# Patient Record
Sex: Female | Born: 1960
Health system: Southern US, Community
[De-identification: ages and names within clinical notes are randomized; demographics above are authoritative.]

## PROBLEM LIST (undated history)

## (undated) DIAGNOSIS — L309 Dermatitis, unspecified: Secondary | ICD-10-CM

## (undated) DIAGNOSIS — I10 Essential (primary) hypertension: Secondary | ICD-10-CM

## (undated) DIAGNOSIS — E559 Vitamin D deficiency, unspecified: Secondary | ICD-10-CM

## (undated) DIAGNOSIS — R7303 Prediabetes: Secondary | ICD-10-CM

## (undated) DIAGNOSIS — T7840XA Allergy, unspecified, initial encounter: Secondary | ICD-10-CM

## (undated) DIAGNOSIS — G43909 Migraine, unspecified, not intractable, without status migrainosus: Secondary | ICD-10-CM

## (undated) DIAGNOSIS — E669 Obesity, unspecified: Secondary | ICD-10-CM

## (undated) DIAGNOSIS — E785 Hyperlipidemia, unspecified: Secondary | ICD-10-CM

## (undated) DIAGNOSIS — J45909 Unspecified asthma, uncomplicated: Secondary | ICD-10-CM

## (undated) HISTORY — DX: Dermatitis, unspecified: L30.9

## (undated) HISTORY — DX: Allergy, unspecified, initial encounter: T78.40XA

## (undated) HISTORY — DX: Prediabetes: R73.03

## (undated) HISTORY — DX: Essential (primary) hypertension: I10

## (undated) HISTORY — DX: Vitamin D deficiency, unspecified: E55.9

## (undated) HISTORY — DX: Unspecified asthma, uncomplicated: J45.909

## (undated) HISTORY — DX: Obesity, unspecified: E66.9

## (undated) HISTORY — DX: Hyperlipidemia, unspecified: E78.5

## (undated) HISTORY — DX: Migraine, unspecified, not intractable, without status migrainosus: G43.909

## (undated) HISTORY — PX: ENDOMETRIAL ABLATION: SHX621

---

## 1989-08-07 HISTORY — PX: LYMPH NODE BIOPSY: SHX201

## 1999-08-24 ENCOUNTER — Encounter: Admission: RE | Admit: 1999-08-24 | Discharge: 1999-08-24 | Payer: Self-pay | Admitting: Gynecology

## 1999-08-24 ENCOUNTER — Encounter: Payer: Self-pay | Admitting: Gynecology

## 1999-09-14 ENCOUNTER — Encounter: Admission: RE | Admit: 1999-09-14 | Discharge: 1999-09-14 | Payer: Self-pay | Admitting: Gynecology

## 1999-09-14 ENCOUNTER — Encounter: Payer: Self-pay | Admitting: Gynecology

## 2000-03-20 ENCOUNTER — Encounter: Payer: Self-pay | Admitting: Internal Medicine

## 2000-03-20 ENCOUNTER — Encounter: Admission: RE | Admit: 2000-03-20 | Discharge: 2000-03-20 | Payer: Self-pay | Admitting: Internal Medicine

## 2000-07-04 ENCOUNTER — Other Ambulatory Visit: Admission: RE | Admit: 2000-07-04 | Discharge: 2000-07-04 | Payer: Self-pay | Admitting: Gynecology

## 2000-09-26 ENCOUNTER — Encounter: Payer: Self-pay | Admitting: Gynecology

## 2000-09-26 ENCOUNTER — Encounter: Admission: RE | Admit: 2000-09-26 | Discharge: 2000-09-26 | Payer: Self-pay | Admitting: Gynecology

## 2001-08-12 ENCOUNTER — Other Ambulatory Visit: Admission: RE | Admit: 2001-08-12 | Discharge: 2001-08-12 | Payer: Self-pay | Admitting: Gynecology

## 2001-09-27 ENCOUNTER — Encounter: Admission: RE | Admit: 2001-09-27 | Discharge: 2001-09-27 | Payer: Self-pay | Admitting: Gynecology

## 2001-09-27 ENCOUNTER — Encounter: Payer: Self-pay | Admitting: Gynecology

## 2001-10-07 ENCOUNTER — Encounter: Admission: RE | Admit: 2001-10-07 | Discharge: 2001-10-07 | Payer: Self-pay | Admitting: Gynecology

## 2001-10-07 ENCOUNTER — Encounter: Payer: Self-pay | Admitting: Gynecology

## 2002-08-26 ENCOUNTER — Other Ambulatory Visit: Admission: RE | Admit: 2002-08-26 | Discharge: 2002-08-26 | Payer: Self-pay | Admitting: Gynecology

## 2002-10-01 ENCOUNTER — Encounter: Payer: Self-pay | Admitting: Gynecology

## 2002-10-01 ENCOUNTER — Encounter: Admission: RE | Admit: 2002-10-01 | Discharge: 2002-10-01 | Payer: Self-pay | Admitting: Gynecology

## 2002-12-30 ENCOUNTER — Encounter: Payer: Self-pay | Admitting: Internal Medicine

## 2002-12-30 ENCOUNTER — Encounter: Admission: RE | Admit: 2002-12-30 | Discharge: 2002-12-30 | Payer: Self-pay | Admitting: Internal Medicine

## 2003-08-31 ENCOUNTER — Other Ambulatory Visit: Admission: RE | Admit: 2003-08-31 | Discharge: 2003-08-31 | Payer: Self-pay | Admitting: Gynecology

## 2003-10-05 ENCOUNTER — Encounter: Admission: RE | Admit: 2003-10-05 | Discharge: 2003-10-05 | Payer: Self-pay | Admitting: Gynecology

## 2004-09-01 ENCOUNTER — Other Ambulatory Visit: Admission: RE | Admit: 2004-09-01 | Discharge: 2004-09-01 | Payer: Self-pay | Admitting: Gynecology

## 2004-11-08 ENCOUNTER — Encounter: Admission: RE | Admit: 2004-11-08 | Discharge: 2004-11-08 | Payer: Self-pay | Admitting: Gynecology

## 2005-11-09 ENCOUNTER — Encounter: Admission: RE | Admit: 2005-11-09 | Discharge: 2005-11-09 | Payer: Self-pay | Admitting: Obstetrics and Gynecology

## 2006-12-06 ENCOUNTER — Encounter: Admission: RE | Admit: 2006-12-06 | Discharge: 2006-12-06 | Payer: Self-pay | Admitting: Obstetrics and Gynecology

## 2006-12-12 ENCOUNTER — Encounter: Admission: RE | Admit: 2006-12-12 | Discharge: 2006-12-12 | Payer: Self-pay | Admitting: Obstetrics and Gynecology

## 2007-08-08 HISTORY — PX: DILATION AND CURETTAGE, DIAGNOSTIC / THERAPEUTIC: SUR384

## 2007-12-09 ENCOUNTER — Encounter: Admission: RE | Admit: 2007-12-09 | Discharge: 2007-12-09 | Payer: Self-pay | Admitting: Obstetrics and Gynecology

## 2008-12-21 ENCOUNTER — Encounter: Admission: RE | Admit: 2008-12-21 | Discharge: 2008-12-21 | Payer: Self-pay | Admitting: Obstetrics and Gynecology

## 2010-01-10 ENCOUNTER — Encounter: Admission: RE | Admit: 2010-01-10 | Discharge: 2010-01-10 | Payer: Self-pay | Admitting: Internal Medicine

## 2010-08-07 HISTORY — PX: NEVUS EXCISION: SHX2090

## 2010-12-21 ENCOUNTER — Other Ambulatory Visit: Payer: Self-pay | Admitting: Internal Medicine

## 2010-12-21 DIAGNOSIS — Z1231 Encounter for screening mammogram for malignant neoplasm of breast: Secondary | ICD-10-CM

## 2011-01-30 ENCOUNTER — Ambulatory Visit
Admission: RE | Admit: 2011-01-30 | Discharge: 2011-01-30 | Disposition: A | Discharge status: Home / Self Care | Payer: 59 | Source: Ambulatory Visit | Attending: Internal Medicine | Admitting: Internal Medicine

## 2011-01-30 DIAGNOSIS — Z1231 Encounter for screening mammogram for malignant neoplasm of breast: Secondary | ICD-10-CM

## 2012-01-04 ENCOUNTER — Other Ambulatory Visit: Payer: Self-pay | Admitting: Obstetrics and Gynecology

## 2012-01-04 DIAGNOSIS — Z1231 Encounter for screening mammogram for malignant neoplasm of breast: Secondary | ICD-10-CM

## 2012-02-12 ENCOUNTER — Ambulatory Visit
Admission: RE | Admit: 2012-02-12 | Discharge: 2012-02-12 | Disposition: A | Discharge status: Home / Self Care | Payer: BC Managed Care – PPO | Source: Ambulatory Visit | Attending: Obstetrics and Gynecology | Admitting: Obstetrics and Gynecology

## 2012-02-12 DIAGNOSIS — Z1231 Encounter for screening mammogram for malignant neoplasm of breast: Secondary | ICD-10-CM

## 2013-01-20 ENCOUNTER — Other Ambulatory Visit: Payer: Self-pay

## 2013-01-20 DIAGNOSIS — Z1231 Encounter for screening mammogram for malignant neoplasm of breast: Secondary | ICD-10-CM

## 2013-01-21 ENCOUNTER — Other Ambulatory Visit: Payer: Self-pay | Admitting: Obstetrics and Gynecology

## 2013-01-21 DIAGNOSIS — N958 Other specified menopausal and perimenopausal disorders: Secondary | ICD-10-CM

## 2013-02-24 ENCOUNTER — Ambulatory Visit
Admission: RE | Admit: 2013-02-24 | Discharge: 2013-02-24 | Disposition: A | Discharge status: Home / Self Care | Payer: BC Managed Care – PPO | Source: Ambulatory Visit

## 2013-02-24 ENCOUNTER — Ambulatory Visit
Admission: RE | Admit: 2013-02-24 | Discharge: 2013-02-24 | Disposition: A | Discharge status: Home / Self Care | Payer: BC Managed Care – PPO | Source: Ambulatory Visit | Attending: Obstetrics and Gynecology | Admitting: Obstetrics and Gynecology

## 2013-02-24 ENCOUNTER — Ambulatory Visit: Payer: BC Managed Care – PPO

## 2013-02-24 DIAGNOSIS — Z1231 Encounter for screening mammogram for malignant neoplasm of breast: Secondary | ICD-10-CM

## 2013-02-24 DIAGNOSIS — N958 Other specified menopausal and perimenopausal disorders: Secondary | ICD-10-CM

## 2013-03-04 LAB — HM MAMMOGRAPHY: HM Mammogram: NEGATIVE

## 2013-07-29 ENCOUNTER — Encounter: Payer: Self-pay | Admitting: Physician Assistant

## 2013-07-29 DIAGNOSIS — J45909 Unspecified asthma, uncomplicated: Secondary | ICD-10-CM | POA: Insufficient documentation

## 2013-07-29 DIAGNOSIS — R7309 Other abnormal glucose: Secondary | ICD-10-CM | POA: Insufficient documentation

## 2013-07-29 DIAGNOSIS — I10 Essential (primary) hypertension: Secondary | ICD-10-CM

## 2013-07-29 DIAGNOSIS — R7303 Prediabetes: Secondary | ICD-10-CM

## 2013-07-29 DIAGNOSIS — E669 Obesity, unspecified: Secondary | ICD-10-CM

## 2013-07-29 DIAGNOSIS — E559 Vitamin D deficiency, unspecified: Secondary | ICD-10-CM

## 2013-07-29 DIAGNOSIS — E785 Hyperlipidemia, unspecified: Secondary | ICD-10-CM

## 2013-07-30 ENCOUNTER — Other Ambulatory Visit: Payer: Self-pay | Admitting: Internal Medicine

## 2013-08-04 ENCOUNTER — Encounter: Payer: Self-pay | Admitting: Physician Assistant

## 2013-08-04 ENCOUNTER — Ambulatory Visit (INDEPENDENT_AMBULATORY_CARE_PROVIDER_SITE_OTHER): Payer: BC Managed Care – PPO | Admitting: Physician Assistant

## 2013-08-04 VITALS — BP 130/80 | HR 80 | Temp 97.9°F | Resp 16 | Ht 63.5 in | Wt 194.0 lb

## 2013-08-04 DIAGNOSIS — R7303 Prediabetes: Secondary | ICD-10-CM

## 2013-08-04 DIAGNOSIS — E669 Obesity, unspecified: Secondary | ICD-10-CM

## 2013-08-04 DIAGNOSIS — Z79899 Other long term (current) drug therapy: Secondary | ICD-10-CM

## 2013-08-04 DIAGNOSIS — R7309 Other abnormal glucose: Secondary | ICD-10-CM

## 2013-08-04 DIAGNOSIS — E785 Hyperlipidemia, unspecified: Secondary | ICD-10-CM

## 2013-08-04 DIAGNOSIS — E559 Vitamin D deficiency, unspecified: Secondary | ICD-10-CM

## 2013-08-04 DIAGNOSIS — I10 Essential (primary) hypertension: Secondary | ICD-10-CM

## 2013-08-04 LAB — BASIC METABOLIC PANEL WITH GFR
BUN: 20 mg/dL (ref 6–23)
CO2: 28 mEq/L (ref 19–32)
Creat: 0.69 mg/dL (ref 0.50–1.10)
Sodium: 142 mEq/L (ref 135–145)

## 2013-08-04 LAB — MAGNESIUM: Magnesium: 2.2 mg/dL (ref 1.5–2.5)

## 2013-08-04 LAB — HEPATIC FUNCTION PANEL
ALT: 27 U/L (ref 0–35)
AST: 22 U/L (ref 0–37)
Alkaline Phosphatase: 81 U/L (ref 39–117)
Bilirubin, Direct: 0.1 mg/dL (ref 0.0–0.3)
Indirect Bilirubin: 0.3 mg/dL (ref 0.0–0.9)
Total Protein: 6.7 g/dL (ref 6.0–8.3)

## 2013-08-04 LAB — CBC WITH DIFFERENTIAL/PLATELET
Eosinophils Absolute: 0.3 10*3/uL (ref 0.0–0.7)
Hemoglobin: 13.3 g/dL (ref 12.0–15.0)
Platelets: 258 10*3/uL (ref 150–400)
RBC: 4.33 MIL/uL (ref 3.87–5.11)
WBC: 8.4 10*3/uL (ref 4.0–10.5)

## 2013-08-04 LAB — LIPID PANEL
LDL Cholesterol: 129 mg/dL — ABNORMAL HIGH (ref 0–99)
Total CHOL/HDL Ratio: 5.8 Ratio

## 2013-08-04 MED ORDER — FUROSEMIDE 40 MG PO TABS: 40.0000 mg | ORAL_TABLET | Freq: Every day | ORAL | Status: DC

## 2013-08-04 NOTE — Progress Notes (Signed)
HPI Patient presents for 3 month follow up with hypertension, hyperlipidemia, prediabetes and vitamin D. Patient's blood pressure has been controlled at home, today their BP is BP: 130/80 mmHg  Patient denies chest pain, shortness of breath, dizziness.  Patient's cholesterol is diet controlled. In addition they are on pravastatin 40 and denies myalgias. The cholesterol last visit was LDL 94. The patient has been working on diet and exercise for prediabetes, and denies changes in vision, polys, and paresthesias. A1C was 5.6.  Her weight is down 6 lbs from her last visit. She is eating less sugar, less fast food, and cutting back on portions. She did a 10 K in Novemeber, no SOB, CP.  Patient is on Vitamin D supplement, last number was 81.  Current Medications:  Current Outpatient Prescriptions on File Prior to Visit  Medication Sig Dispense Refill  . albuterol (PROVENTIL HFA;VENTOLIN HFA) 108 (90 BASE) MCG/ACT inhaler Inhale into the lungs every 6 (six) hours as needed for wheezing or shortness of breath.      . budesonide-formoterol (SYMBICORT) 160-4.5 MCG/ACT inhaler Inhale 2 puffs into the lungs 2 (two) times daily.      . calcium carbonate (OS-CAL) 600 MG TABS tablet Take 600 mg by mouth 2 (two) times daily with a meal.      . cholecalciferol (VITAMIN D) 1000 UNITS tablet Take 5,000 Units by mouth daily.      . cyanocobalamin 500 MCG tablet Take 500 mcg by mouth daily.      . furosemide (LASIX) 40 MG tablet Take 40 mg by mouth.      . losartan (COZAAR) 100 MG tablet Take 100 mg by mouth daily.      . mometasone (NASONEX) 50 MCG/ACT nasal spray Place 2 sprays into the nose daily.      . potassium chloride SA (K-DUR,KLOR-CON) 20 MEQ tablet Take 20 mEq by mouth 2 (two) times daily.      . pravastatin (PRAVACHOL) 40 MG tablet take 1 tablet by mouth at bedtime for cholesterol  90 tablet  0   No current facility-administered medications on file prior to visit.   Medical History:  Past Medical  History  Diagnosis Date  . Hypertension   . Hyperlipidemia   . Allergy   . Asthma   . Vitamin D deficiency   . Prediabetes   . Obesity   . Migraine    Allergies:  Allergies  Allergen Reactions  . Ace Inhibitors     cough  . Azithromycin     rash  . Prednisone     High dose  . Vicodin [Hydrocodone-Acetaminophen]     Nausea/vomiting    ROS Constitutional: Denies fever, chills, headaches, insomnia, fatigue, night sweats Eyes: Denies redness, blurred vision, diplopia, discharge, itchy, watery eyes.  ENT: Denies congestion, post nasal drip, sore throat, earache, dental pain, Tinnitus, Vertigo, Sinus pain, snoring.  Cardio: Denies chest pain, palpitations, irregular heartbeat, dyspnea, diaphoresis, orthopnea, PND, claudication, edema Respiratory: denies cough, shortness of breath, wheezing.  Gastrointestinal: Denies dysphagia, heartburn, AB pain/ cramps, N/V, diarrhea, constipation, hematemesis, melena, hematochezia,  hemorrhoids Genitourinary: Denies dysuria, frequency, urgency, nocturia, hesitancy, discharge, hematuria, flank pain Musculoskeletal: Denies myalgia, stiffness, pain, swelling and strain/sprain. Skin: Denies pruritis, rash, changing in skin lesion Neuro: Denies Weakness, tremor, incoordination, spasms, pain Psychiatric: Denies confusion, memory loss, sensory loss Endocrine: Denies change in weight, skin, hair change, nocturia Diabetic Polys, Denies visual blurring, hyper /hypo glycemic episodes, and paresthesia, Heme/Lymph: Denies Excessive bleeding, bruising, enlarged lymph nodes  Family  history- Review and unchanged Social history- Review and unchanged Physical Exam: Filed Vitals:   08/04/13 1604  BP: 130/80  Pulse: 80  Temp: 97.9 F (36.6 C)  Resp: 16   Filed Weights   08/04/13 1604  Weight: 194 lb (87.998 kg)   General Appearance: Well nourished, in no apparent distress. Eyes: PERRLA, EOMs, conjunctiva no swelling or erythema Sinuses: No  Frontal/maxillary tenderness ENT/Mouth: Ext aud canals clear, TMs without erythema, bulging. No erythema, swelling, or exudate on post pharynx.  Tonsils not swollen or erythematous. Hearing normal.  Neck: Supple, thyroid normal.  Respiratory: Respiratory effort normal, BS equal bilaterally without rales, rhonchi, wheezing or stridor.  Cardio: RRR with no MRGs. Brisk peripheral pulses without edema.  Abdomen: Soft, + BS.  Non tender, no guarding, rebound, hernias, masses. Lymphatics: Non tender without lymphadenopathy.  Musculoskeletal: Full ROM, 5/5 strength, normal gait.  Skin: Warm, dry without rashes, lesions, ecchymosis.  Neuro: Cranial nerves intact. Normal muscle tone, no cerebellar symptoms. Sensation intact.  Psych: Awake and oriented X 3, normal affect, Insight and Judgment appropriate.   Assessment and Plan:  Hypertension: Continue medication, monitor blood pressure at home.  Continue DASH diet. Cholesterol: Continue diet and exercise. Check cholesterol.  Pre-diabetes-Continue diet and exercise. Check A1C Vitamin D Def- check level and continue medications.   Continue diet and meds as discussed. Further disposition pending results of labs.  Rhonda [name] 4:08 PM

## 2013-08-04 NOTE — Patient Instructions (Signed)
   Bad carbs also include fruit juice, alcohol, and sweet tea. These are empty calories that do not signal to your brain that you are full.   Please remember the good carbs are still carbs which convert into sugar. So please measure them out no more than 1/2-1 cup of rice, oatmeal, pasta, and beans.  Veggies are however free foods! Pile them on.   I like lean protein at every meal such as chicken, turkey, pork chops, cottage cheese, etc. Just do not fry these meats and please center your meal around vegetable, the meats should be a side dish.   No all fruit is created equal. Please see the list below, the fruit at the bottom is higher in sugars than the fruit at the top   Remember exercise is great for your cardiovascular health and can help with weight loss but YOU CAN NOT OUT RUN YOUR FORK!     

## 2013-08-05 ENCOUNTER — Other Ambulatory Visit: Payer: Self-pay

## 2013-08-05 LAB — VITAMIN D 25 HYDROXY (VIT D DEFICIENCY, FRACTURES): Vit D, 25-Hydroxy: 74 ng/mL (ref 30–89)

## 2013-08-05 LAB — HEMOGLOBIN A1C: Mean Plasma Glucose: 111 mg/dL (ref <117)

## 2013-08-05 MED ORDER — FUROSEMIDE 40 MG PO TABS: 40.0000 mg | ORAL_TABLET | Freq: Two times a day (BID) | ORAL | Status: DC

## 2013-11-11 ENCOUNTER — Encounter: Payer: Self-pay | Admitting: Internal Medicine

## 2013-11-11 ENCOUNTER — Ambulatory Visit (INDEPENDENT_AMBULATORY_CARE_PROVIDER_SITE_OTHER): Payer: BC Managed Care – PPO | Admitting: Internal Medicine

## 2013-11-11 VITALS — BP 146/80 | HR 72 | Temp 97.9°F | Resp 16 | Ht 63.5 in | Wt 197.6 lb

## 2013-11-11 DIAGNOSIS — Z79899 Other long term (current) drug therapy: Secondary | ICD-10-CM | POA: Insufficient documentation

## 2013-11-11 DIAGNOSIS — E559 Vitamin D deficiency, unspecified: Secondary | ICD-10-CM

## 2013-11-11 DIAGNOSIS — E785 Hyperlipidemia, unspecified: Secondary | ICD-10-CM

## 2013-11-11 DIAGNOSIS — J209 Acute bronchitis, unspecified: Secondary | ICD-10-CM

## 2013-11-11 DIAGNOSIS — I10 Essential (primary) hypertension: Secondary | ICD-10-CM

## 2013-11-11 DIAGNOSIS — R7309 Other abnormal glucose: Secondary | ICD-10-CM

## 2013-11-11 DIAGNOSIS — R7303 Prediabetes: Secondary | ICD-10-CM

## 2013-11-11 LAB — CBC WITH DIFFERENTIAL/PLATELET
Basophils Absolute: 0.1 10*3/uL (ref 0.0–0.1)
Basophils Relative: 1 % (ref 0–1)
EOS ABS: 0.1 10*3/uL (ref 0.0–0.7)
EOS PCT: 2 % (ref 0–5)
HCT: 35.7 % — ABNORMAL LOW (ref 36.0–46.0)
Hemoglobin: 12.6 g/dL (ref 12.0–15.0)
LYMPHS ABS: 1.2 10*3/uL (ref 0.7–4.0)
Lymphocytes Relative: 21 % (ref 12–46)
MCH: 29.9 pg (ref 26.0–34.0)
MCHC: 35.3 g/dL (ref 30.0–36.0)
MCV: 84.8 fL (ref 78.0–100.0)
Monocytes Absolute: 0.4 10*3/uL (ref 0.1–1.0)
Monocytes Relative: 7 % (ref 3–12)
Neutro Abs: 3.9 10*3/uL (ref 1.7–7.7)
Neutrophils Relative %: 69 % (ref 43–77)
PLATELETS: 236 10*3/uL (ref 150–400)
RBC: 4.21 MIL/uL (ref 3.87–5.11)
RDW: 13.4 % (ref 11.5–15.5)
WBC: 5.6 10*3/uL (ref 4.0–10.5)

## 2013-11-11 LAB — LIPID PANEL
Cholesterol: 146 mg/dL (ref 0–200)
HDL: 44 mg/dL (ref >39)
LDL Cholesterol: 69 mg/dL (ref 0–99)
Total CHOL/HDL Ratio: 3.3 Ratio
Triglycerides: 165 mg/dL — ABNORMAL HIGH (ref <150)
VLDL: 33 mg/dL (ref 0–40)

## 2013-11-11 LAB — HEMOGLOBIN A1C
HEMOGLOBIN A1C: 5.5 % (ref <5.7)
Mean Plasma Glucose: 111 mg/dL (ref <117)

## 2013-11-11 MED ORDER — PROMETHAZINE-DM 6.25-15 MG/5ML PO SYRP: ORAL_SOLUTION | ORAL | Status: DC

## 2013-11-11 MED ORDER — LEVOFLOXACIN 500 MG PO TABS: 500.0000 mg | ORAL_TABLET | Freq: Every day | ORAL | Status: AC

## 2013-11-11 MED ORDER — BENZONATATE 100 MG PO CAPS: ORAL_CAPSULE | ORAL | Status: DC

## 2013-11-11 MED ORDER — PREDNISONE 10 MG PO TABS: 10.0000 mg | ORAL_TABLET | ORAL | Status: DC

## 2013-11-11 NOTE — Patient Instructions (Signed)

## 2013-11-11 NOTE — Progress Notes (Signed)
Patient ID: Rhonda [name], female   DOB: 10-20-1960, 53 y.o.   MRN: [id number]    This very nice 53 y.o. DWF presents for 3 month follow up with Hypertension, Hyperlipidemia, Pre-Diabetes and Vitamin D Deficiency. Patient also reports 4-5 day Hx/o Lt maxillary tenderness and productive cough.   HTN predates since 2007. BP has been controlled at home. Today's BP: 146/80 mmHg . Patient denies any cardiac type chest pain, palpitations, dyspnea/orthopnea/PND, dizziness, claudication, or dependent edema.   Hyperlipidemia is not controlled with diet & meds. Last Cholesterol was 226, Triglycerides were 288, HDL 39 and LDL 129 in end Dec 2014. Patient denies myalgias or other med SE's.    Also, the patient has history of PreDiabetes/insulin resistance since 2012 with elevated insulin levels 77 and normal A1c and last A1c of 5.5% in end Dec 2014. Patient denies any symptoms of reactive hypoglycemia, diabetic polys, paresthesias or visual blurring.   Further, Patient has history of Vitamin D Deficiency of 22 in 2008 and with last vitamin D of 81 in Oct 2014. Patient supplements vitamin D without any suspected side-effects.  Medication Sig  . albuterol (PROVENTIL HFA;VENTOLIN HFA Inhale into the lungs every 6 (six) hours as needed   . budesonide-formoterol (SYMBICORT) 160-4.5 MCG/ACT inhaler Inhale 2 puffs into the lungs 2 (two) times daily.  . cholecalciferol (VITAMIN D) 1000 UNITS tablet Take 5,000 Units by mouth daily.  . furosemide (LASIX) 40 MG tablet Take 1 tablet (40 mg total) by mouth 2 (two) times daily.  Marland Kitchen losartan (COZAAR) 100 MG tablet Take 100 mg by mouth daily.  . montelukast (SINGULAIR) 10 MG tablet Take 10 mg by mouth at bedtime.  . potassium chloride SA (K-DUR,KLOR-CON) 20 MEQ  Take 20 mEq by mouth 2 (two) times daily.  . pravastatin (PRAVACHOL) 40 MG tablet take 1 tablet by mouth at bedtime for cholesterol    Allergies  Allergen Reactions  . Ace Inhibitors     cough  . Azithromycin      rash  . Prednisone     High dose  . Vicodin [Hydrocodone-Acetaminophen]     Nausea/vomiting    PMHx:   Past Medical History  Diagnosis Date  . Hypertension   . Hyperlipidemia   . Allergy   . Asthma   . Vitamin D deficiency   . Prediabetes   . Obesity   . Migraine    FHx:    Reviewed / unchanged  SHx:    Reviewed / unchanged   Systems Review: Constitutional: Denies fever, chills, wt changes, headaches, insomnia, fatigue, night sweats, change in appetite. Eyes: Denies redness, blurred vision, diplopia, discharge, itchy, watery eyes.  ENT: Denies discharge, congestion, post nasal drip, epistaxis, sore throat, earache, hearing loss, dental pain, tinnitus, vertigo, snoring. Has Left maxillary tenderness. CV: Denies chest pain, palpitations, irregular heartbeat, syncope, dyspnea, diaphoresis, orthopnea, PND, claudication, edema. Respiratory: denies dyspnea, DOE, pleurisy, hoarseness, laryngitis, wheezing. C/o productive cough. Gastrointestinal: Denies dysphagia, odynophagia, heartburn, reflux, water brash, abdominal pain or cramps, nausea, vomiting, bloating, diarrhea, constipation, hematemesis, melena, hematochezia,  or hemorrhoids. Genitourinary: Denies dysuria, frequency, urgency, nocturia, hesitancy, discharge, hematuria, flank pain. Musculoskeletal: Denies arthralgias, myalgias, stiffness, jt. swelling, pain, limp, strain/sprain.  Skin: Denies pruritus, rash, hives, warts, acne, eczema, change in skin lesion(s). Neuro: No weakness, tremor, incoordination, spasms, paresthesia, or pain. Psychiatric: Denies confusion, memory loss, or sensory loss. Endo: Denies change in weight, skin, hair change.  Heme/Lymph: No excessive bleeding, bruising, or enlarged lymph nodes.   Exam:  BP  146/80  Pulse 72  Temp(Src) 97.9 F (36.6 C) (Temporal)  Resp 16  Ht 5' 3.5" (1.613 m)  Wt 197 lb 9.6 oz (89.631 kg)  BMI 34.45 kg/m2  Appears well nourished - in no distress. Eyes: PERRLA,  EOMs, conjunctiva no swelling or erythema. Sinuses: No frontal tenderness, but has left maxillary tenderness ENT/Mouth: EAC's clear, TM's nl w/o erythema, bulging. Nares clear w/o erythema, swelling, exudates. Oropharynx clear without erythema or exudates. Oral hygiene is good. Tongue normal, non obstructing. Hearing intact.  Neck: Supple. Thyroid nl. Car 2+/2+ without bruits, nodes or JVD. Chest: Respirations nl with scattered coarse rales and rhonchi, but no wheezing or stridor.  Cor: Heart sounds normal w/ regular rate and rhythm without sig. murmurs, gallops, clicks, or rubs. Peripheral pulses normal and equal  without edema.  Abdomen: Soft & bowel sounds normal. Non-tender w/o guarding, rebound, hernias, masses, or organomegaly.  Lymphatics: Unremarkable.  Musculoskeletal: Full ROM all peripheral extremities, joint stability, 5/5 strength, and normal gait.  Skin: Warm, dry without exposed rashes, lesions, ecchymosis apparent.  Neuro: Cranial nerves intact, reflexes equal bilaterally. Sensory-motor testing grossly intact. Tendon reflexes grossly intact.  Pysch: Alert & oriented x 3. Insight and judgement nl & appropriate. No ideations.  Assessment and Plan:  1. Hypertension - Continue monitor blood pressure at home. Continue diet/meds same.  2. Hyperlipidemia - Continue diet/meds, exercise,& lifestyle modifications. Continue monitor periodic cholesterol/liver & renal functions   3. Pre-diabetes/Insulin Resistance - Continue diet, exercise, lifestyle modifications. Monitor appropriate labs.  4. Vitamin D Deficiency - Continue supplementation.  5. Bronchitis/Sinusitis - Rx Levaquin/Prednisone taper/ Tessalon perles/Phenergan Syrupw/DM  Recommended regular exercise, BP monitoring, weight control, and discussed med and SE's. Recommended labs to assess and monitor clinical status. Further disposition pending results of labs.

## 2013-11-12 LAB — BASIC METABOLIC PANEL WITH GFR
BUN: 16 mg/dL (ref 6–23)
CALCIUM: 9.8 mg/dL (ref 8.4–10.5)
CO2: 27 meq/L (ref 19–32)
CREATININE: 0.8 mg/dL (ref 0.50–1.10)
Chloride: 106 mEq/L (ref 96–112)
GFR, Est African American: >89 mL/min
GFR, Est Non African American: 84 mL/min
Glucose, Bld: 106 mg/dL — ABNORMAL HIGH (ref 70–99)
Potassium: 4.3 mEq/L (ref 3.5–5.3)
Sodium: 141 mEq/L (ref 135–145)

## 2013-11-12 LAB — HEPATIC FUNCTION PANEL
ALT: 22 U/L (ref 0–35)
AST: 21 U/L (ref 0–37)
Albumin: 4.5 g/dL (ref 3.5–5.2)
Alkaline Phosphatase: 81 U/L (ref 39–117)
Bilirubin, Direct: 0.1 mg/dL (ref 0.0–0.3)
Indirect Bilirubin: 0.2 mg/dL (ref 0.2–1.2)
TOTAL PROTEIN: 6.6 g/dL (ref 6.0–8.3)
Total Bilirubin: 0.3 mg/dL (ref 0.2–1.2)

## 2013-11-12 LAB — INSULIN, FASTING: Insulin fasting, serum: 56 u[IU]/mL — ABNORMAL HIGH (ref 3–28)

## 2013-11-12 LAB — VITAMIN D 25 HYDROXY (VIT D DEFICIENCY, FRACTURES): VIT D 25 HYDROXY: 66 ng/mL (ref 30–89)

## 2013-11-12 LAB — TSH: TSH: 2.975 u[IU]/mL (ref 0.350–4.500)

## 2013-11-12 LAB — MAGNESIUM: MAGNESIUM: 2.1 mg/dL (ref 1.5–2.5)

## 2013-11-14 ENCOUNTER — Other Ambulatory Visit: Payer: Self-pay | Admitting: Internal Medicine

## 2013-11-14 ENCOUNTER — Other Ambulatory Visit: Payer: Self-pay | Admitting: Emergency Medicine

## 2013-11-14 MED ORDER — PRAVASTATIN SODIUM 40 MG PO TABS: ORAL_TABLET | ORAL | Status: DC

## 2014-02-05 ENCOUNTER — Ambulatory Visit (INDEPENDENT_AMBULATORY_CARE_PROVIDER_SITE_OTHER): Payer: BC Managed Care – PPO | Admitting: Emergency Medicine

## 2014-02-05 ENCOUNTER — Encounter: Payer: Self-pay | Admitting: Emergency Medicine

## 2014-02-05 VITALS — BP 152/98 | HR 76 | Temp 98.4°F | Resp 16 | Ht 63.5 in | Wt 201.0 lb

## 2014-02-05 DIAGNOSIS — I10 Essential (primary) hypertension: Secondary | ICD-10-CM

## 2014-02-05 DIAGNOSIS — R7309 Other abnormal glucose: Secondary | ICD-10-CM

## 2014-02-05 DIAGNOSIS — E782 Mixed hyperlipidemia: Secondary | ICD-10-CM

## 2014-02-05 NOTE — Progress Notes (Signed)
Subjective:    Patient ID: Rhonda [name], female    DOB: 1961-08-02, 53 y.o.   MRN: [id number]  HPI Comments: 53 yo WF presents for 3 month F/U for HTN, Cholesterol, Pre-Dm, D. Deficient. She has had increased stress with mom. She is not exercising due to lack of time. She has been busy with mowing and moving boxes. She is eating not as healthy. She notes BP is up with stress. She feels stress will get better after this week.   WBC             5.6   11/11/2013 HGB            12.6   11/11/2013 HCT            35.7   11/11/2013 PLT             236   11/11/2013 GLUCOSE         106   11/11/2013 CHOL            146   11/11/2013 TRIG            165   11/11/2013 HDL              44   11/11/2013 LDLCALC          69   11/11/2013 ALT              22   11/11/2013 AST              21   11/11/2013 NA              141   11/11/2013 K               4.3   11/11/2013 CL              106   11/11/2013 CREATININE     0.80   11/11/2013 BUN              16   11/11/2013 CO2              27   11/11/2013 TSH           2.975   11/11/2013 HGBA1C          5.5   11/11/2013   Hyperlipidemia  Hypertension  Asthma Her past medical history is significant for asthma.      Medication List       This list is accurate as of: 02/05/14 10:09 AM.  Always use your most recent med list.               albuterol 108 (90 BASE) MCG/ACT inhaler  Commonly known as:  PROVENTIL HFA;VENTOLIN HFA  Inhale into the lungs every 6 (six) hours as needed for wheezing or shortness of breath.     budesonide-formoterol 160-4.5 MCG/ACT inhaler  Commonly known as:  SYMBICORT  Inhale 2 puffs into the lungs 2 (two) times daily.     cholecalciferol 1000 UNITS tablet  Commonly known as:  VITAMIN D  Take 5,000 Units by mouth daily.     furosemide 40 MG tablet  Commonly known as:  LASIX  Take 1 tablet (40 mg total) by mouth 2 (two) times daily.     glucosamine-chondroitin 500-400 MG tablet  Take 1 tablet by mouth daily.     losartan 100 MG tablet  Commonly  known as:  COZAAR  take 1 tablet by mouth once daily  montelukast 10 MG tablet  Commonly known as:  SINGULAIR  Take 10 mg by mouth at bedtime.     MULTIVITAMIN PO  Take by mouth daily.     OVER THE COUNTER MEDICATION  Bone strenght vitamin with Calcium and Magnesium     potassium chloride SA 20 MEQ tablet  Commonly known as:  K-DUR,KLOR-CON  Take 20 mEq by mouth 2 (two) times daily.     pravastatin 40 MG tablet  Commonly known as:  PRAVACHOL  take 1 tablet by mouth at bedtime for cholesterol       Allergies  Allergen Reactions  . Ace Inhibitors     cough  . Azithromycin     rash  . Prednisone     High dose  . Vicodin [Hydrocodone-Acetaminophen]     Nausea/vomiting   Past Medical History  Diagnosis Date  . Hypertension   . Hyperlipidemia   . Allergy   . Asthma   . Vitamin D deficiency   . Prediabetes   . Obesity   . Migraine      Review of Systems  All other systems reviewed and are negative.  BP 152/98  Pulse 76  Temp(Src) 98.4 F (36.9 C) (Temporal)  Resp 16  Ht 5' 3.5" (1.613 m)  Wt 201 lb (91.173 kg)  BMI 35.04 kg/m2     Objective:   Physical Exam  Nursing note and vitals reviewed. Constitutional: She is oriented to person, place, and time. She appears well-developed and well-nourished. No distress.  HENT:  Head: Normocephalic and atraumatic.  Right Ear: External ear normal.  Left Ear: External ear normal.  Nose: Nose normal.  Mouth/Throat: Oropharynx is clear and moist.  Eyes: Conjunctivae and EOM are normal.  Neck: Normal range of motion. Neck supple. No thyromegaly present.  Cardiovascular: Normal rate, regular rhythm, normal heart sounds and intact distal pulses.   Pulmonary/Chest: Effort normal and breath sounds normal.  Abdominal: Soft. Bowel sounds are normal. She exhibits no distension. There is no tenderness.  Musculoskeletal: Normal range of motion. She exhibits no edema and no tenderness.  Lymphadenopathy:    She has no  cervical adenopathy.  Neurological: She is alert and oriented to person, place, and time. No cranial nerve deficit.  Skin: Skin is warm and dry. No rash noted. No erythema. No pallor.  Psychiatric: She has a normal mood and affect. Her behavior is normal. Judgment and thought content normal.  Tearful but appropriate          Assessment & Plan:  1.  3 month F/U for HTN, Cholesterol, Pre-Dm, D. Deficient. Needs healthy diet, cardio QD and obtain healthy weight. Check Labs, Check BP if >130/80 call office   2. Stress- ADvised f/u if no improvement for medication discussion, try mental clarity 10 min walk breaks

## 2014-02-05 NOTE — Patient Instructions (Signed)

## 2014-02-06 LAB — CBC WITH DIFFERENTIAL/PLATELET
Basophils Absolute: 0.1 10*3/uL (ref 0.0–0.1)
Basophils Relative: 1 % (ref 0–1)
Eosinophils Absolute: 0.2 10*3/uL (ref 0.0–0.7)
Eosinophils Relative: 3 % (ref 0–5)
HEMATOCRIT: 37.9 % (ref 36.0–46.0)
HEMOGLOBIN: 13 g/dL (ref 12.0–15.0)
LYMPHS ABS: 1.8 10*3/uL (ref 0.7–4.0)
LYMPHS PCT: 29 % (ref 12–46)
MCH: 30.2 pg (ref 26.0–34.0)
MCHC: 34.3 g/dL (ref 30.0–36.0)
MCV: 88.1 fL (ref 78.0–100.0)
MONO ABS: 0.3 10*3/uL (ref 0.1–1.0)
MONOS PCT: 5 % (ref 3–12)
NEUTROS ABS: 3.9 10*3/uL (ref 1.7–7.7)
Neutrophils Relative %: 62 % (ref 43–77)
Platelets: 257 10*3/uL (ref 150–400)
RBC: 4.3 MIL/uL (ref 3.87–5.11)
RDW: 13.8 % (ref 11.5–15.5)
WBC: 6.3 10*3/uL (ref 4.0–10.5)

## 2014-02-06 LAB — INSULIN, FASTING: Insulin fasting, serum: 27 u[IU]/mL (ref 3–28)

## 2014-02-06 LAB — BASIC METABOLIC PANEL WITH GFR
BUN: 16 mg/dL (ref 6–23)
CO2: 26 meq/L (ref 19–32)
Calcium: 9.8 mg/dL (ref 8.4–10.5)
Chloride: 104 mEq/L (ref 96–112)
Creat: 0.72 mg/dL (ref 0.50–1.10)
GFR, Est African American: >89 mL/min
GFR, Est Non African American: >89 mL/min
Glucose, Bld: 88 mg/dL (ref 70–99)
Potassium: 4.3 mEq/L (ref 3.5–5.3)
SODIUM: 141 meq/L (ref 135–145)

## 2014-02-06 LAB — HEPATIC FUNCTION PANEL
ALK PHOS: 76 U/L (ref 39–117)
ALT: 26 U/L (ref 0–35)
AST: 25 U/L (ref 0–37)
Albumin: 4.3 g/dL (ref 3.5–5.2)
Bilirubin, Direct: 0.1 mg/dL (ref 0.0–0.3)
Indirect Bilirubin: 0.3 mg/dL (ref 0.2–1.2)
TOTAL PROTEIN: 6.4 g/dL (ref 6.0–8.3)
Total Bilirubin: 0.4 mg/dL (ref 0.2–1.2)

## 2014-02-06 LAB — HEMOGLOBIN A1C
Hgb A1c MFr Bld: 5.5 % (ref <5.7)
MEAN PLASMA GLUCOSE: 111 mg/dL (ref <117)

## 2014-02-06 LAB — LIPID PANEL
CHOLESTEROL: 148 mg/dL (ref 0–200)
HDL: 48 mg/dL (ref >39)
LDL Cholesterol: 74 mg/dL (ref 0–99)
Total CHOL/HDL Ratio: 3.1 Ratio
Triglycerides: 132 mg/dL (ref <150)
VLDL: 26 mg/dL (ref 0–40)

## 2014-02-11 ENCOUNTER — Other Ambulatory Visit: Payer: Self-pay

## 2014-02-11 ENCOUNTER — Other Ambulatory Visit: Payer: Self-pay | Admitting: Obstetrics and Gynecology

## 2014-02-11 DIAGNOSIS — Z1231 Encounter for screening mammogram for malignant neoplasm of breast: Secondary | ICD-10-CM

## 2014-03-03 ENCOUNTER — Ambulatory Visit
Admission: RE | Admit: 2014-03-03 | Discharge: 2014-03-03 | Disposition: A | Discharge status: Home / Self Care | Payer: BC Managed Care – PPO | Source: Ambulatory Visit

## 2014-03-03 DIAGNOSIS — Z1231 Encounter for screening mammogram for malignant neoplasm of breast: Secondary | ICD-10-CM

## 2014-05-12 ENCOUNTER — Encounter: Payer: Self-pay | Admitting: Internal Medicine

## 2014-05-12 ENCOUNTER — Ambulatory Visit (INDEPENDENT_AMBULATORY_CARE_PROVIDER_SITE_OTHER): Payer: BC Managed Care – PPO | Admitting: Internal Medicine

## 2014-05-12 VITALS — BP 154/98 | HR 80 | Temp 97.7°F | Resp 16 | Ht 64.5 in | Wt 203.8 lb

## 2014-05-12 DIAGNOSIS — Z79899 Other long term (current) drug therapy: Secondary | ICD-10-CM

## 2014-05-12 DIAGNOSIS — I10 Essential (primary) hypertension: Secondary | ICD-10-CM

## 2014-05-12 DIAGNOSIS — Z111 Encounter for screening for respiratory tuberculosis: Secondary | ICD-10-CM

## 2014-05-12 DIAGNOSIS — R6889 Other general symptoms and signs: Secondary | ICD-10-CM

## 2014-05-12 DIAGNOSIS — R7309 Other abnormal glucose: Secondary | ICD-10-CM

## 2014-05-12 DIAGNOSIS — R7303 Prediabetes: Secondary | ICD-10-CM

## 2014-05-12 DIAGNOSIS — E785 Hyperlipidemia, unspecified: Secondary | ICD-10-CM

## 2014-05-12 DIAGNOSIS — E559 Vitamin D deficiency, unspecified: Secondary | ICD-10-CM

## 2014-05-12 DIAGNOSIS — Z0001 Encounter for general adult medical examination with abnormal findings: Secondary | ICD-10-CM

## 2014-05-12 DIAGNOSIS — Z1212 Encounter for screening for malignant neoplasm of rectum: Secondary | ICD-10-CM

## 2014-05-12 MED ORDER — BISOPROLOL-HYDROCHLOROTHIAZIDE 5-6.25 MG PO TABS: ORAL_TABLET | ORAL | Status: DC

## 2014-05-12 NOTE — Progress Notes (Signed)
Patient ID: Rhonda [name], female   DOB: 08/23/1960, 53 y.o.   MRN: [id number]  Annual Screening Comprehensive Examination  This very nice 53 y.o.female presents for complete physical.  Patient has been followed for HTN,  Prediabetes, Hyperlipidemia, and Vitamin D Deficiency.    HTN predates since 2007. Patient's BP has been controlled at home and patient denies any cardiac symptoms as chest pain, palpitations, shortness of breath, dizziness or ankle swelling. Today's BP was  154/98 mmHg.    Patient's hyperlipidemia is controlled with diet and medications. Patient denies myalgias or other medication SE's. Last lipids wereTotal Chol 148; HDL  48; LDL  74; Trig 132 at goal on  02/05/2014.   Patient has prediabetes and insulin resistance with Nl A1c 5.4% and elevated insulin 77 in Sept 2011 and is monitored at quarterly OV's.  Patient denies reactive hypoglycemic symptoms, visual blurring, diabetic polys, or paresthesias. Last A1c was 02/05/2014: Hemoglobin-A1c 5.5   Finally, patient has history of Vitamin D Deficiency (22 in 2008) and last Vitamin D was 11/11/2013.  Medication Sig  . albuterol (PROVENTIL HFA;VENTOLIN HFA) 108 (90 BASE) MCG/ACT inhaler Inhale into the lungs every 6 (six) hours as needed for wheezing or shortness of breath.  . budesonide-formoterol (SYMBICORT) 160-4.5 MCG/ACT inhaler Inhale 2 puffs into the lungs 2 (two) times daily.  . cholecalciferol (VITAMIN D) 1000 UNITS tablet Take 5,000 Units by mouth daily.  . furosemide (LASIX) 40 MG tablet Take 1 tablet (40 mg total) by mouth 2 (two) times daily.  Marland Kitchen glucosamine-chondroitin 500-400 MG tablet Take 1 tablet by mouth daily.  Marland Kitchen losartan (COZAAR) 100 MG tablet take 1 tablet by mouth once daily  . montelukast (SINGULAIR) 10 MG tablet Take 10 mg by mouth at bedtime.  . Multiple Vitamins-Minerals (MULTIVITAMIN PO) Take by mouth daily.  Marland Kitchen OVER THE COUNTER MEDICATION Bone strenght vitamin with Calcium and Magnesium  . potassium chloride  SA (K-DUR,KLOR-CON) 20 MEQ tablet Take 20 mEq by mouth 2 (two) times daily.  . pravastatin (PRAVACHOL) 40 MG tablet take 1 tablet by mouth at bedtime for cholesterol   Allergies  Allergen Reactions  . Ace Inhibitors     cough  . Azithromycin     rash  . Prednisone     High dose  . Vicodin [Hydrocodone-Acetaminophen]     Nausea/vomiting   Past Medical History  Diagnosis Date  . Hypertension   . Hyperlipidemia   . Allergy   . Asthma   . Vitamin D deficiency   . Prediabetes   . Obesity   . Migraine    Past Surgical History  Procedure Laterality Date  . Dilation and curettage, diagnostic / therapeutic  2009  . Nevus excision  2012    dysplastic from back  . Lymph node biopsy Left 1991    negative   Family History  Problem Relation Age of Onset  . Hypertension Mother   . COPD Father   . Cancer Father     colon  . Heart disease Father   . Hyperlipidemia Father   . Hypertension Father   . Stroke Father    History  Substance Use Topics  . Smoking status: Never Smoker   . Smokeless tobacco: Never Used  . Alcohol Use: No    ROS Constitutional: Denies fever, chills, weight loss/gain, headaches, insomnia, fatigue, night sweats, and change in appetite. Eyes: Denies redness, blurred vision, diplopia, discharge, itchy, watery eyes.  ENT: Denies discharge, congestion, post nasal drip, epistaxis, sore throat, earache, hearing  loss, dental pain, Tinnitus, Vertigo, Sinus pain, snoring.  Cardio: Denies chest pain, palpitations, irregular heartbeat, syncope, dyspnea, diaphoresis, orthopnea, PND, claudication, edema Respiratory: denies cough, dyspnea, DOE, pleurisy, hoarseness, laryngitis, wheezing.  Gastrointestinal: Denies dysphagia, heartburn, reflux, water brash, pain, cramps, nausea, vomiting, bloating, diarrhea, constipation, hematemesis, melena, hematochezia, jaundice, hemorrhoids Genitourinary: Denies dysuria, frequency, urgency, nocturia, hesitancy, discharge, hematuria,  flank pain Breast: Breast lumps, nipple discharge, bleeding.  Musculoskeletal: Denies arthralgia, myalgia, stiffness, Jt. Swelling, pain, limp, and strain/sprain. Denies falls. Skin: Denies puritis, rash, hives, warts, acne, eczema, changing in skin lesion Neuro: No weakness, tremor, incoordination, spasms, paresthesia, pain Psychiatric: Denies confusion, memory loss, sensory loss. Denies Depression. Endocrine: Denies change in weight, skin, hair change, nocturia, and paresthesia, diabetic polys, visual blurring, hyper / hypo glycemic episodes.  Heme/Lymph: No excessive bleeding, bruising, enlarged lymph nodes.  Physical Exam  BP 154/98  Pulse 80  Temp 97.7 F   Resp 16  Ht 5' 4.5"   Wt 203 lb 12.8 oz   BMI 34.45  General Appearance: Well nourished and in no apparent distress. Eyes: PERRLA, EOMs, conjunctiva no swelling or erythema, normal fundi and vessels. Sinuses: No frontal/maxillary tenderness ENT/Mouth: EACs patent / TMs  nl. Nares clear without erythema, swelling, mucoid exudates. Oral hygiene is good. No erythema, swelling, or exudate. Tongue normal, non-obstructing. Tonsils not swollen or erythematous. Hearing normal.  Neck: Supple, thyroid normal. No bruits, nodes or JVD. Respiratory: Respiratory effort normal.  BS equal and clear bilateral without rales, rhonci, wheezing or stridor. Cardio: Heart sounds are normal with regular rate and rhythm and no murmurs, rubs or gallops. Peripheral pulses are normal and equal bilaterally without edema. No aortic or femoral bruits. Chest: symmetric with normal excursions and percussion. Breasts: Symmetric, without lumps, nipple discharge, retractions, or fibrocystic changes.  Abdomen: Flat, soft, with bowl sounds. Nontender, no guarding, rebound, hernias, masses, or organomegaly.  Lymphatics: Non tender without lymphadenopathy.  Genitourinary:  Musculoskeletal: Full ROM all peripheral extremities, joint stability, 5/5 strength, and normal  gait. Skin: Warm and dry without rashes, lesions, cyanosis, clubbing or  ecchymosis.  Neuro: Cranial nerves intact, reflexes equal bilaterally. Normal muscle tone, no cerebellar symptoms. Sensation intact.  Pysch: Awake and oriented X 3, normal affect, Insight and Judgment appropriate.  Assessment and Plan  1. Annual Screening Examination 2. Hypertension  3. Hyperlipidemia 4. Pre Diabetes 5. Vitamin D Deficiency   Continue prudent diet as discussed, weight control, BP monitoring, regular exercise, and medications. Discussed med's effects and SE's. Screening labs and tests as requested with regular follow-up as recommended.  Given Rx Ziac -5 and advised to monitor BP's and start with 1/2 tab and titrate up to 1 whole tab if needed and cautioned for possible exacerbation of asthma.

## 2014-05-12 NOTE — Patient Instructions (Signed)
Recommend the book "The END of DIETING" by Dr Baker Janus   and the book "The END of DIABETES " by Dr Excell Seltzer  At [email or web address] - get book & Audio CD's      Being diabetic has a  300% increased risk for heart attack, stroke, cancer, and alzheimer- type vascular dementia. It is very important that you work harder with diet by avoiding all foods that are white except chicken & fish. Avoid white rice (brown & wild rice is OK), white potatoes (sweetpotatoes in moderation is OK), White bread or wheat bread or anything made out of white flour like bagels, donuts, rolls, buns, biscuits, cakes, pastries, cookies, pizza crust, and pasta (made from white flour & egg whites) - vegetarian pasta or spinach or wheat pasta is OK. Multigrain breads like Arnold's or [employer], or multigrain sandwich thins or flatbreads.  Diet, exercise and weight loss can reverse and cure diabetes in the early stages.  Diet, exercise and weight loss is very important in the control and prevention of complications of diabetes which affects every system in your body, ie. Brain - dementia/stroke, eyes - glaucoma/blindness, heart - heart attack/heart failure, kidneys - dialysis, stomach - gastric paralysis, intestines - malabsorption, nerves - severe painful neuritis, circulation - gangrene & loss of a leg(s), and finally cancer and Alzheimers.    I recommend avoid fried & greasy foods,  sweets/candy, white rice (brown or wild rice or Quinoa is OK), white potatoes (sweet potatoes are OK) - anything made from white flour - bagels, doughnuts, rolls, buns, biscuits,white and wheat breads, pizza crust and traditional pasta made of white flour & egg white(vegetarian pasta or spinach or wheat pasta is OK).  Multi-grain bread is OK - like multi-grain flat bread or sandwich thins. Avoid alcohol in excess. Exercise is also important.    Eat all the vegetables you want - avoid meat, especially red meat and dairy - especially cheese.  Cheese  is the most concentrated form of trans-fats which is the worst thing to clog up our arteries. Veggie cheese is OK which can be found in the fresh produce section at [employer] or [employer] or [employer]  Preventive Care for Adults A healthy lifestyle and preventive care can promote health and wellness. Preventive health guidelines for women include the following key practices.  A routine yearly physical is a good way to check with your health care provider about your health and preventive screening. It is a chance to share any concerns and updates on your health and to receive a thorough exam.  Visit your dentist for a routine exam and preventive care every 6 months. Brush your teeth twice a day and floss once a day. Good oral hygiene prevents tooth decay and gum disease.  The frequency of eye exams is based on your age, health, family medical history, use of contact lenses, and other factors. Follow your health care provider's recommendations for frequency of eye exams.  Eat a healthy diet. Foods like vegetables, fruits, whole grains, low-fat dairy products, and lean protein foods contain the nutrients you need without too many calories. Decrease your intake of foods high in solid fats, added sugars, and salt. Eat the right amount of calories for you.Get information about a proper diet from your health care provider, if necessary.  Regular physical exercise is one of the most important things you can do for your health. Most adults should get at least 150 minutes of moderate-intensity exercise (any activity that increases  your heart rate and causes you to sweat) each week. In addition, most adults need muscle-strengthening exercises on 2 or more days a week.  Maintain a healthy weight. The body mass index (BMI) is a screening tool to identify possible weight problems. It provides an estimate of body fat based on height and weight. Your health care provider can find your BMI and can help you  achieve or maintain a healthy weight.For adults 20 years and older:  A BMI below 18.5 is considered underweight.  A BMI of 18.5 to 24.9 is normal.  A BMI of 25 to 29.9 is considered overweight.  A BMI of 30 and above is considered obese.  Maintain normal blood lipids and cholesterol levels by exercising and minimizing your intake of saturated fat. Eat a balanced diet with plenty of fruit and vegetables. Blood tests for lipids and cholesterol should begin at age 43 and be repeated every 5 years. If your lipid or cholesterol levels are high, you are over 50, or you are at high risk for heart disease, you may need your cholesterol levels checked more frequently.Ongoing high lipid and cholesterol levels should be treated with medicines if diet and exercise are not working.  If you smoke, find out from your health care provider how to quit. If you do not use tobacco, do not start.  Lung cancer screening is recommended for adults aged 40-80 years who are at high risk for developing lung cancer because of a history of smoking. A yearly low-dose CT scan of the lungs is recommended for people who have at least a 30-pack-year history of smoking and are a current smoker or have quit within the past 15 years. A pack year of smoking is smoking an average of 1 pack of cigarettes a day for 1 year (for example: 1 pack a day for 30 years or 2 packs a day for 15 years). Yearly screening should continue until the smoker has stopped smoking for at least 15 years. Yearly screening should be stopped for people who develop a health problem that would prevent them from having lung cancer treatment.  If you are pregnant, do not drink alcohol. If you are breastfeeding, be very cautious about drinking alcohol. If you are not pregnant and choose to drink alcohol, do not have more than 1 drink per day. One drink is considered to be 12 ounces (355 mL) of beer, 5 ounces (148 mL) of wine, or 1.5 ounces (44 mL) of liquor.  Avoid  use of street drugs. Do not share needles with anyone. Ask for help if you need support or instructions about stopping the use of drugs.  High blood pressure causes heart disease and increases the risk of stroke. Your blood pressure should be checked at least every 1 to 2 years. Ongoing high blood pressure should be treated with medicines if weight loss and exercise do not work.  If you are 74-43 years old, ask your health care provider if you should take aspirin to prevent strokes.  Diabetes screening involves taking a blood sample to check your fasting blood sugar level. This should be done once every 3 years, after age 105, if you are within normal weight and without risk factors for diabetes. Testing should be considered at a younger age or be carried out more frequently if you are overweight and have at least 1 risk factor for diabetes.  Breast cancer screening is essential preventive care for women. You should practice "breast self-awareness." This means understanding the  normal appearance and feel of your breasts and may include breast self-examination. Any changes detected, no matter how small, should be reported to a health care provider. Women in their 77s and 30s should have a clinical breast exam (CBE) by a health care provider as part of a regular health exam every 1 to 3 years. After age 52, women should have a CBE every year. Starting at age 59, women should consider having a mammogram (breast X-ray test) every year. Women who have a family history of breast cancer should talk to their health care provider about genetic screening. Women at a high risk of breast cancer should talk to their health care providers about having an MRI and a mammogram every year.  Breast cancer gene (BRCA)-related cancer risk assessment is recommended for women who have family members with BRCA-related cancers. BRCA-related cancers include breast, ovarian, tubal, and peritoneal cancers. Having family members with  these cancers may be associated with an increased risk for harmful changes (mutations) in the breast cancer genes BRCA1 and BRCA2. Results of the assessment will determine the need for genetic counseling and BRCA1 and BRCA2 testing.  Routine pelvic exams to screen for cancer are no longer recommended for nonpregnant women who are considered low risk for cancer of the pelvic organs (ovaries, uterus, and vagina) and who do not have symptoms. Ask your health care provider if a screening pelvic exam is right for you.  If you have had past treatment for cervical cancer or a condition that could lead to cancer, you need Pap tests and screening for cancer for at least 20 years after your treatment. If Pap tests have been discontinued, your risk factors (such as having a new sexual partner) need to be reassessed to determine if screening should be resumed. Some women have medical problems that increase the chance of getting cervical cancer. In these cases, your health care provider may recommend more frequent screening and Pap tests.  The HPV test is an additional test that may be used for cervical cancer screening. The HPV test looks for the virus that can cause the cell changes on the cervix. The cells collected during the Pap test can be tested for HPV. The HPV test could be used to screen women aged 43 years and older, and should be used in women of any age who have unclear Pap test results. After the age of 50, women should have HPV testing at the same frequency as a Pap test.  Colorectal cancer can be detected and often prevented. Most routine colorectal cancer screening begins at the age of 30 years and continues through age 66 years. However, your health care provider may recommend screening at an earlier age if you have risk factors for colon cancer. On a yearly basis, your health care provider may provide home test kits to check for hidden blood in the stool. Use of a small camera at the end of a tube, to  directly examine the colon (sigmoidoscopy or colonoscopy), can detect the earliest forms of colorectal cancer. Talk to your health care provider about this at age 43, when routine screening begins. Direct exam of the colon should be repeated every 5-10 years through age 56 years, unless early forms of pre-cancerous polyps or small growths are found.  People who are at an increased risk for hepatitis B should be screened for this virus. You are considered at high risk for hepatitis B if:  You were born in a country where hepatitis B occurs  often. Talk with your health care provider about which countries are considered high risk.  Your parents were born in a high-risk country and you have not received a shot to protect against hepatitis B (hepatitis B vaccine).  You have HIV or AIDS.  You use needles to inject street drugs.  You live with, or have sex with, someone who has hepatitis B.  You get hemodialysis treatment.  You take certain medicines for conditions like cancer, organ transplantation, and autoimmune conditions.  Hepatitis C blood testing is recommended for all people born from [birth year] through 1965 and any individual with known risks for hepatitis C.  Practice safe sex. Use condoms and avoid high-risk sexual practices to reduce the spread of sexually transmitted infections (STIs). STIs include gonorrhea, chlamydia, syphilis, trichomonas, herpes, HPV, and human immunodeficiency virus (HIV). Herpes, HIV, and HPV are viral illnesses that have no cure. They can result in disability, cancer, and death.  You should be screened for sexually transmitted illnesses (STIs) including gonorrhea and chlamydia if:  You are sexually active and are younger than 24 years.  You are older than 24 years and your health care provider tells you that you are at risk for this type of infection.  Your sexual activity has changed since you were last screened and you are at an increased risk for chlamydia or  gonorrhea. Ask your health care provider if you are at risk.  If you are at risk of being infected with HIV, it is recommended that you take a prescription medicine daily to prevent HIV infection. This is called preexposure prophylaxis (PrEP). You are considered at risk if:  You are a heterosexual woman, are sexually active, and are at increased risk for HIV infection.  You take drugs by injection.  You are sexually active with a partner who has HIV.  Talk with your health care provider about whether you are at high risk of being infected with HIV. If you choose to begin PrEP, you should first be tested for HIV. You should then be tested every 3 months for as long as you are taking PrEP.  Osteoporosis is a disease in which the bones lose minerals and strength with aging. This can result in serious bone fractures or breaks. The risk of osteoporosis can be identified using a bone density scan. Women ages 35 years and over and women at risk for fractures or osteoporosis should discuss screening with their health care providers. Ask your health care provider whether you should take a calcium supplement or vitamin D to reduce the rate of osteoporosis.  Menopause can be associated with physical symptoms and risks. Hormone replacement therapy is available to decrease symptoms and risks. You should talk to your health care provider about whether hormone replacement therapy is right for you.  Use sunscreen. Apply sunscreen liberally and repeatedly throughout the day. You should seek shade when your shadow is shorter than you. Protect yourself by wearing long sleeves, pants, a wide-brimmed hat, and sunglasses year round, whenever you are outdoors.  Once a month, do a whole body skin exam, using a mirror to look at the skin on your back. Tell your health care provider of new moles, moles that have irregular borders, moles that are larger than a pencil eraser, or moles that have changed in shape or  color.  Stay current with required vaccines (immunizations).  Influenza vaccine. All adults should be immunized every year.  Tetanus, diphtheria, and acellular pertussis (Td, Tdap) vaccine. Pregnant women should receive  1 dose of Tdap vaccine during each pregnancy. The dose should be obtained regardless of the length of time since the last dose. Immunization is preferred during the 27th-36th week of gestation. An adult who has not previously received Tdap or who does not know her vaccine status should receive 1 dose of Tdap. This initial dose should be followed by tetanus and diphtheria toxoids (Td) booster doses every 10 years. Adults with an unknown or incomplete history of completing a 3-dose immunization series with Td-containing vaccines should begin or complete a primary immunization series including a Tdap dose. Adults should receive a Td booster every 10 years.  Varicella vaccine. An adult without evidence of immunity to varicella should receive 2 doses or a second dose if she has previously received 1 dose. Pregnant females who do not have evidence of immunity should receive the first dose after pregnancy. This first dose should be obtained before leaving the health care facility. The second dose should be obtained 4-8 weeks after the first dose.  Human papillomavirus (HPV) vaccine. Females aged 13-26 years who have not received the vaccine previously should obtain the 3-dose series. The vaccine is not recommended for use in pregnant females. However, pregnancy testing is not needed before receiving a dose. If a female is found to be pregnant after receiving a dose, no treatment is needed. In that case, the remaining doses should be delayed until after the pregnancy. Immunization is recommended for any person with an immunocompromised condition through the age of 32 years if she did not get any or all doses earlier. During the 3-dose series, the second dose should be obtained 4-8 weeks after the  first dose. The third dose should be obtained 24 weeks after the first dose and 16 weeks after the second dose.  Zoster vaccine. One dose is recommended for adults aged 26 years or older unless certain conditions are present.  Measles, mumps, and rubella (MMR) vaccine. Adults born before [birth year] generally are considered immune to measles and mumps. Adults born in [birth year] or later should have 1 or more doses of MMR vaccine unless there is a contraindication to the vaccine or there is laboratory evidence of immunity to each of the three diseases. A routine second dose of MMR vaccine should be obtained at least 28 days after the first dose for students attending postsecondary schools, health care workers, or international travelers. People who received inactivated measles vaccine or an unknown type of measles vaccine during 1963-1967 should receive 2 doses of MMR vaccine. People who received inactivated mumps vaccine or an unknown type of mumps vaccine before 1979 and are at high risk for mumps infection should consider immunization with 2 doses of MMR vaccine. For females of childbearing age, rubella immunity should be determined. If there is no evidence of immunity, females who are not pregnant should be vaccinated. If there is no evidence of immunity, females who are pregnant should delay immunization until after pregnancy. Unvaccinated health care workers born before [birth year] who lack laboratory evidence of measles, mumps, or rubella immunity or laboratory confirmation of disease should consider measles and mumps immunization with 2 doses of MMR vaccine or rubella immunization with 1 dose of MMR vaccine.  Pneumococcal 13-valent conjugate (PCV13) vaccine. When indicated, a person who is uncertain of her immunization history and has no record of immunization should receive the PCV13 vaccine. An adult aged 47 years or older who has certain medical conditions and has not been previously immunized should receive 1 dose of  PCV13 vaccine. This PCV13 should be followed with a dose of pneumococcal polysaccharide (PPSV23) vaccine. The PPSV23 vaccine dose should be obtained at least 8 weeks after the dose of PCV13 vaccine. An adult aged 66 years or older who has certain medical conditions and previously received 1 or more doses of PPSV23 vaccine should receive 1 dose of PCV13. The PCV13 vaccine dose should be obtained 1 or more years after the last PPSV23 vaccine dose.  Pneumococcal polysaccharide (PPSV23) vaccine. When PCV13 is also indicated, PCV13 should be obtained first. All adults aged 41 years and older should be immunized. An adult younger than age 20 years who has certain medical conditions should be immunized. Any person who resides in a nursing home or long-term care facility should be immunized. An adult smoker should be immunized. People with an immunocompromised condition and certain other conditions should receive both PCV13 and PPSV23 vaccines. People with human immunodeficiency virus (HIV) infection should be immunized as soon as possible after diagnosis. Immunization during chemotherapy or radiation therapy should be avoided. Routine use of PPSV23 vaccine is not recommended for American Indians, [place] Natives, or people younger than 65 years unless there are medical conditions that require PPSV23 vaccine. When indicated, people who have unknown immunization and have no record of immunization should receive PPSV23 vaccine. One-time revaccination 5 years after the first dose of PPSV23 is recommended for people aged 19-64 years who have chronic kidney failure, nephrotic syndrome, asplenia, or immunocompromised conditions. People who received 1-2 doses of PPSV23 before age 33 years should receive another dose of PPSV23 vaccine at age 19 years or later if at least 5 years have passed since the previous dose. Doses of PPSV23 are not needed for people immunized with PPSV23 at or after age 15 years.  Meningococcal vaccine.  Adults with asplenia or persistent complement component deficiencies should receive 2 doses of quadrivalent meningococcal conjugate (MenACWY-D) vaccine. The doses should be obtained at least 2 months apart. Microbiologists working with certain meningococcal bacteria, [place] [place] recruits, people at risk during an outbreak, and people who travel to or live in countries with a high rate of meningitis should be immunized. A first-year college student up through age 52 years who is living in a residence hall should receive a dose if she did not receive a dose on or after her 16th birthday. Adults who have certain high-risk conditions should receive one or more doses of vaccine.  Hepatitis A vaccine. Adults who wish to be protected from this disease, have certain high-risk conditions, work with hepatitis A-infected animals, work in hepatitis A research labs, or travel to or work in countries with a high rate of hepatitis A should be immunized. Adults who were previously unvaccinated and who anticipate close contact with an international adoptee during the first 60 days after arrival in the [place] from a country with a high rate of hepatitis A should be immunized.  Hepatitis B vaccine. Adults who wish to be protected from this disease, have certain high-risk conditions, may be exposed to blood or other infectious body fluids, are household contacts or sex partners of hepatitis B positive people, are clients or workers in certain care facilities, or travel to or work in countries with a high rate of hepatitis B should be immunized.  Haemophilus influenzae type b (Hib) vaccine. A previously unvaccinated person with asplenia or sickle cell disease or having a scheduled splenectomy should receive 1 dose of Hib vaccine. Regardless of previous immunization, a recipient of a hematopoietic stem  cell transplant should receive a 3-dose series 6-12 months after her successful transplant. Hib vaccine is not recommended for  adults with HIV infection. Preventive Services / Frequency  Ages 40 to 64 years  Blood pressure check.** / Every 1 to 2 years.  Lipid and cholesterol check.** / Every 5 years beginning at age 20 years.  Lung cancer screening. / Every year if you are aged 55-80 years and have a 30-pack-year history of smoking and currently smoke or have quit within the past 15 years. Yearly screening is stopped once you have quit smoking for at least 15 years or develop a health problem that would prevent you from having lung cancer treatment.  Clinical breast exam.** / Every year after age 40 years.  BRCA-related cancer risk assessment.** / For women who have family members with a BRCA-related cancer (breast, ovarian, tubal, or peritoneal cancers).  Mammogram.** / Every year beginning at age 40 years and continuing for as long as you are in good health. Consult with your health care provider.  Pap test.** / Every 3 years starting at age 30 years through age 65 or 70 years with a history of 3 consecutive normal Pap tests.  HPV screening.** / Every 3 years from ages 30 years through ages 65 to 70 years with a history of 3 consecutive normal Pap tests.  Fecal occult blood test (FOBT) of stool. / Every year beginning at age 50 years and continuing until age 75 years. You may not need to do this test if you get a colonoscopy every 10 years.  Flexible sigmoidoscopy or colonoscopy.** / Every 5 years for a flexible sigmoidoscopy or every 10 years for a colonoscopy beginning at age 50 years and continuing until age 75 years.  Hepatitis C blood test.** / For all people born from 1945 through 1965 and any individual with known risks for hepatitis C.  Skin self-exam. / Monthly.  Influenza vaccine. / Every year.  Tetanus, diphtheria, and acellular pertussis (Tdap/Td) vaccine.** / Consult your health care provider. Pregnant women should receive 1 dose of Tdap vaccine during each pregnancy. 1 dose of Td every 10  years.  Varicella vaccine.** / Consult your health care provider. Pregnant females who do not have evidence of immunity should receive the first dose after pregnancy.  Zoster vaccine.** / 1 dose for adults aged 60 years or older.  Measles, mumps, rubella (MMR) vaccine.** / You need at least 1 dose of MMR if you were born in 1957 or later. You may also need a 2nd dose. For females of childbearing age, rubella immunity should be determined. If there is no evidence of immunity, females who are not pregnant should be vaccinated. If there is no evidence of immunity, females who are pregnant should delay immunization until after pregnancy.  Pneumococcal 13-valent conjugate (PCV13) vaccine.** / Consult your health care provider.  Pneumococcal polysaccharide (PPSV23) vaccine.** / 1 to 2 doses if you smoke cigarettes or if you have certain conditions.  Meningococcal vaccine.** / Consult your health care provider.  Hepatitis A vaccine.** / Consult your health care provider.  Hepatitis B vaccine.** / Consult your health care provider.  Haemophilus influenzae type b (Hib) vaccine.** / Consult your health care provider.  

## 2014-05-13 LAB — HEPATIC FUNCTION PANEL
ALBUMIN: 4.6 g/dL (ref 3.5–5.2)
ALK PHOS: 91 U/L (ref 39–117)
ALT: 31 U/L (ref 0–35)
AST: 27 U/L (ref 0–37)
Bilirubin, Direct: 0.1 mg/dL (ref 0.0–0.3)
Indirect Bilirubin: 0.3 mg/dL (ref 0.2–1.2)
TOTAL PROTEIN: 7 g/dL (ref 6.0–8.3)
Total Bilirubin: 0.4 mg/dL (ref 0.2–1.2)

## 2014-05-13 LAB — IRON AND TIBC
%SAT: 21 % (ref 20–55)
IRON: 68 ug/dL (ref 42–145)
TIBC: 319 ug/dL (ref 250–470)
UIBC: 251 ug/dL (ref 125–400)

## 2014-05-13 LAB — CBC WITH DIFFERENTIAL/PLATELET
BASOS ABS: 0.1 10*3/uL (ref 0.0–0.1)
BASOS PCT: 1 % (ref 0–1)
EOS PCT: 3 % (ref 0–5)
Eosinophils Absolute: 0.2 10*3/uL (ref 0.0–0.7)
HEMATOCRIT: 39.6 % (ref 36.0–46.0)
HEMOGLOBIN: 13.6 g/dL (ref 12.0–15.0)
Lymphocytes Relative: 28 % (ref 12–46)
Lymphs Abs: 2.1 10*3/uL (ref 0.7–4.0)
MCH: 30.7 pg (ref 26.0–34.0)
MCHC: 34.3 g/dL (ref 30.0–36.0)
MCV: 89.4 fL (ref 78.0–100.0)
MONOS PCT: 6 % (ref 3–12)
Monocytes Absolute: 0.5 10*3/uL (ref 0.1–1.0)
Neutro Abs: 4.7 10*3/uL (ref 1.7–7.7)
Neutrophils Relative %: 62 % (ref 43–77)
Platelets: 256 10*3/uL (ref 150–400)
RBC: 4.43 MIL/uL (ref 3.87–5.11)
RDW: 13.7 % (ref 11.5–15.5)
WBC: 7.5 10*3/uL (ref 4.0–10.5)

## 2014-05-13 LAB — BASIC METABOLIC PANEL WITH GFR
BUN: 17 mg/dL (ref 6–23)
CO2: 28 mEq/L (ref 19–32)
Calcium: 10.3 mg/dL (ref 8.4–10.5)
Chloride: 103 mEq/L (ref 96–112)
Creat: 0.9 mg/dL (ref 0.50–1.10)
GFR, EST AFRICAN AMERICAN: 84 mL/min
GFR, EST NON AFRICAN AMERICAN: 73 mL/min
Glucose, Bld: 88 mg/dL (ref 70–99)
POTASSIUM: 4.2 meq/L (ref 3.5–5.3)
Sodium: 142 mEq/L (ref 135–145)

## 2014-05-13 LAB — MICROALBUMIN / CREATININE URINE RATIO
Creatinine, Urine: 67.8 mg/dL
MICROALB UR: 0.4 mg/dL (ref <2.0)
Microalb Creat Ratio: 5.9 mg/g (ref 0.0–30.0)

## 2014-05-13 LAB — URINALYSIS, MICROSCOPIC ONLY
BACTERIA UA: NONE SEEN
CASTS: NONE SEEN
CRYSTALS: NONE SEEN
Squamous Epithelial / LPF: NONE SEEN

## 2014-05-13 LAB — HEMOGLOBIN A1C
HEMOGLOBIN A1C: 5.5 % (ref <5.7)
MEAN PLASMA GLUCOSE: 111 mg/dL (ref <117)

## 2014-05-13 LAB — LIPID PANEL
CHOLESTEROL: 169 mg/dL (ref 0–200)
HDL: 51 mg/dL (ref >39)
LDL CALC: 71 mg/dL (ref 0–99)
Total CHOL/HDL Ratio: 3.3 Ratio
Triglycerides: 236 mg/dL — ABNORMAL HIGH (ref <150)
VLDL: 47 mg/dL — AB (ref 0–40)

## 2014-05-13 LAB — TSH: TSH: 2.908 u[IU]/mL (ref 0.350–4.500)

## 2014-05-13 LAB — VITAMIN B12: Vitamin B-12: 630 pg/mL (ref 211–911)

## 2014-05-13 LAB — VITAMIN D 25 HYDROXY (VIT D DEFICIENCY, FRACTURES): Vit D, 25-Hydroxy: 58 ng/mL (ref 30–89)

## 2014-05-13 LAB — MAGNESIUM: Magnesium: 2.2 mg/dL (ref 1.5–2.5)

## 2014-05-13 LAB — INSULIN, FASTING: Insulin fasting, serum: 26.3 u[IU]/mL — ABNORMAL HIGH (ref 2.0–19.6)

## 2014-05-15 LAB — TB SKIN TEST
INDURATION: 0 mm
TB Skin Test: NEGATIVE

## 2014-05-25 ENCOUNTER — Other Ambulatory Visit: Payer: Self-pay | Admitting: *Deleted

## 2014-05-25 MED ORDER — LOSARTAN POTASSIUM 100 MG PO TABS: ORAL_TABLET | ORAL | Status: DC

## 2014-05-25 MED ORDER — PRAVASTATIN SODIUM 40 MG PO TABS: ORAL_TABLET | ORAL | Status: DC

## 2014-08-18 ENCOUNTER — Ambulatory Visit (INDEPENDENT_AMBULATORY_CARE_PROVIDER_SITE_OTHER): Payer: BLUE CROSS/BLUE SHIELD | Admitting: Physician Assistant

## 2014-08-18 ENCOUNTER — Encounter: Payer: Self-pay | Admitting: Physician Assistant

## 2014-08-18 VITALS — BP 128/82 | HR 68 | Temp 97.7°F | Resp 16 | Ht 64.5 in | Wt 199.0 lb

## 2014-08-18 DIAGNOSIS — R7303 Prediabetes: Secondary | ICD-10-CM

## 2014-08-18 DIAGNOSIS — J45909 Unspecified asthma, uncomplicated: Secondary | ICD-10-CM

## 2014-08-18 DIAGNOSIS — Z79899 Other long term (current) drug therapy: Secondary | ICD-10-CM

## 2014-08-18 DIAGNOSIS — E559 Vitamin D deficiency, unspecified: Secondary | ICD-10-CM

## 2014-08-18 DIAGNOSIS — I10 Essential (primary) hypertension: Secondary | ICD-10-CM

## 2014-08-18 DIAGNOSIS — E785 Hyperlipidemia, unspecified: Secondary | ICD-10-CM

## 2014-08-18 DIAGNOSIS — E669 Obesity, unspecified: Secondary | ICD-10-CM

## 2014-08-18 DIAGNOSIS — R7309 Other abnormal glucose: Secondary | ICD-10-CM

## 2014-08-18 NOTE — Patient Instructions (Addendum)
Stop the pravastatin for 1 week to see if it helps your muscle aches, then get back on it 1/2 a pill.  Wash your mouth with warm water/apple cider vinegar after using symbicort, can swish and swallow or spit.   Stop the ziac and monitor BP, call if greater than 140/80.   Ways to cut 100 calories  1. Eat your eggs with hot sauce OR salsa instead of cheese.  Eggs are great for breakfast, but many people consider eggs and cheese to be BFFs. Instead of cheese-1 oz. of cheddar has 114 calories-top your eggs with hot sauce, which contains no calories and helps with satiety and metabolism. Salsa is also a great option!!  2. Top your toast, waffles or pancakes with mashed berries instead of jelly or syrup. Half a cup of berries-fresh, frozen or thawed-has about 40 calories, compared with 2 tbsp. of maple syrup or jelly, which both have about 100 calories. The berries will also give you a good punch of fiber, which helps keep you full and satisfied and won't spike blood sugar quickly like the jelly or syrup. 3. Swap the non-fat latte for black coffee with a splash of half-and-half. Contrary to its name, that non-fat latte has 130 calories and a startling 19g of carbohydrates per 16 oz. serving. Replacing that 'light' drinkable dessert with a black coffee with a splash of half-and-half saves you more than 100 calories per 16 oz. serving. 4. Sprinkle salads with freeze-dried raspberries instead of dried cranberries. If you want a sweet addition to your nutritious salad, stay away from dried cranberries. They have a whopping 130 calories per  cup and 30g carbohydrates. Instead, sprinkle freeze-dried raspberries guilt-free and save more than 100 calories per  cup serving, adding 3g of belly-filling fiber. 5. Go for mustard in place of mayo on your sandwich. Mustard can add really nice flavor to any sandwich, and there are tons of varieties, from spicy to honey. A serving of mayo is 95 calories, versus 10  calories in a serving of mustard. 6. Choose a DIY salad dressing instead of the store-bought kind. Mix Dijon or whole grain mustard with low-fat Kefir or red wine vinegar and garlic. 7. Use hummus as a spread instead of a dip. Use hummus as a spread on a high-fiber cracker or tortilla with a sandwich and save on calories without sacrificing taste. 8. Pick just one salad "accessory." Salad isn't automatically a calorie winner. It's easy to over-accessorize with toppings. Instead of topping your salad with nuts, avocado and cranberries (all three will clock in at 313 calories), just pick one. The next day, choose a different accessory, which will also keep your salad interesting. You don't wear all your jewelry every day, right? 9. Ditch the white pasta in favor of spaghetti squash. One cup of cooked spaghetti squash has about 40 calories, compared with traditional spaghetti, which comes with more than 200. Spaghetti squash is also nutrient-dense. It's a good source of fiber and Vitamins A and C, and it can be eaten just like you would eat pasta-with a great tomato sauce and [place] meatballs or with pesto, tofu and spinach, for example. 10. Dress up your chili, soups and stews with non-fat [place] yogurt instead of sour cream. Just a 'dollop' of sour cream can set you back 115 calories and a whopping 12g of fat-seven of which are of the artery-clogging variety. Added bonus: [place] yogurt is packed with muscle-building protein, calcium and B Vitamins. 11. Mash cauliflower instead of  mashed potatoes. One cup of traditional mashed potatoes-in all their creamy goodness-has more than 200 calories, compared to mashed cauliflower, which you can typically eat for less than 100 calories per 1 cup serving. Cauliflower is a great source of the antioxidant indole-3-carbinol (I3C), which may help reduce the risk of some cancers, like breast cancer. 12. Ditch the ice cream sundae in favor of a [place] yogurt  parfait. Instead of a cup of ice cream or fro-yo for dessert, try 1 cup of nonfat Greek yogurt topped with fresh berries and a sprinkle of cacao nibs. Both toppings are packed with antioxidants, which can help reduce cellular inflammation and oxidative damage. And the comparison is a no-brainer: One cup of ice cream has about 275 calories; one cup of frozen yogurt has about 230; and a cup of Greek yogurt has just 130, plus twice the protein, so you're less likely to return to the freezer for a second helping. 13. Put olive oil in a spray container instead of using it directly from the bottle. Each tablespoon of olive oil is 120 calories and 15g of fat. Use a mister instead of pouring it straight into the pan or onto a salad. This allows for portion control and will save you more than 100 calories. 14. When baking, substitute canned pumpkin for butter or oil. Canned pumpkin-not pumpkin pie mix-is loaded with Vitamin A, which is important for skin and eye health, as well as immunity. And the comparisons are pretty crazy:  cup of canned pumpkin has about 40 calories, compared to butter or oil, which has more than 800 calories. Yes, 800 calories. Applesauce and mashed banana can also serve as good substitutions for butter or oil, usually in a 1:1 ratio. 15. Top casseroles with high-fiber cereal instead of breadcrumbs. Breadcrumbs are typically made with white bread, while breakfast cereals contain 5-9g of fiber per serving. Not only will you save more than 150 calories per  cup serving, the swap will also keep you more full and you'll get a metabolism boost from the added fiber. 16. Snack on pistachios instead of macadamia nuts. Believe it or not, you get the same amount of calories from 35 pistachios (100 calories) as you would from only five macadamia nuts. 17. Chow down on kale chips rather than potato chips. This is my favorite 'don't knock it 'till you try it' swap. Kale chips are so easy to make at  home, and you can spice them up with a little grated parmesan or chili powder. Plus, they're a mere fraction of the calories of potato chips, but with the same crunch factor we crave so often. 18. Add seltzer and some fruit slices to your cocktail instead of soda or fruit juice. One cup of soda or fruit juice can pack on as much as 140 calories. Instead, use seltzer and fruit slices. The fruit provides valuable phytochemicals, such as flavonoids and anthocyanins, which help to combat cancer and stave off the aging process.

## 2014-08-18 NOTE — Progress Notes (Signed)
Assessment and Plan:  Hypertension: Continue medication but try ziac 1/2 pill or every other day and then stop it and  monitor blood pressure at home, call if greater than 140/90. Continue DASH diet.  Reminder to go to the ER if any CP, SOB, nausea, dizziness, severe HA, changes vision/speech, left arm numbness and tingling, and jaw pain. Cholesterol: Continue diet and exercise. Check cholesterol. Can try to cut pravastatin in half.   Pre-diabetes-Continue diet and exercise. Check insulin Vitamin D Def- check level and continue medications.  Obesity with co morbidities- long discussion about weight loss, diet, and exercise Asthma controlled- wash mouth out afterwards.   Continue diet and meds as discussed. Further disposition pending results of labs.  HPI 54 y.o. female  presents for 3 month follow up with hypertension, hyperlipidemia, prediabetes and vitamin D. Her blood pressure has been controlled at home and states that her BP has been as low as 100/58 with the addition of the ziac and has been very fatigued due to it, today their BP is BP: 128/82 mmHg She does workout, runs 2-3 days a week.  She denies chest pain, shortness of breath, dizziness. She has asthma and is doing well on the symbicort and uses albuterol occ.Complaining of some sore throat, no other symptoms, reminded to wash mouth out after symbicort.  She is on cholesterol medication, pravastatin 40mg  daily and denies myalgias. Her cholesterol is at goal. The cholesterol last visit was:   Lab Results  Component Value Date   CHOL 169 05/12/2014   HDL 51 05/12/2014   LDLCALC 71 05/12/2014   TRIG 236* 05/12/2014   CHOLHDL 3.3 05/12/2014  She has been working on diet and exercise for prediabetes, and denies paresthesia of the feet, polydipsia and polyuria. Last A1C in the office was:  Lab Results  Component Value Date   HGBA1C 5.5 05/12/2014  Patient is on Vitamin D supplement.   Lab Results  Component Value Date   VD25OH 58  05/12/2014  BMI is Body mass index is 33.64 kg/(m^2)., she is working on diet and exercise. Wt Readings from Last 3 Encounters:  08/18/14 199 lb (90.266 kg)  05/12/14 203 lb 12.8 oz (92.443 kg)  02/05/14 201 lb (91.173 kg)       Current Medications:  Current Outpatient Prescriptions on File Prior to Visit  Medication Sig Dispense Refill  . albuterol (PROVENTIL HFA;VENTOLIN HFA) 108 (90 BASE) MCG/ACT inhaler Inhale into the lungs every 6 (six) hours as needed for wheezing or shortness of breath.    . bisoprolol-hydrochlorothiazide (ZIAC) 5-6.25 MG per tablet Take 1/2 to 1 tablet daily for BP 90 tablet 99  . budesonide-formoterol (SYMBICORT) 160-4.5 MCG/ACT inhaler Inhale 2 puffs into the lungs 2 (two) times daily.    . cholecalciferol (VITAMIN D) 1000 UNITS tablet Take 5,000 Units by mouth daily.    Marland Kitchen glucosamine-chondroitin 500-400 MG tablet Take 1 tablet by mouth daily.    Marland Kitchen losartan (COZAAR) 100 MG tablet take 1 tablet by mouth once daily 90 tablet 3  . montelukast (SINGULAIR) 10 MG tablet Take 10 mg by mouth at bedtime.    . Multiple Vitamins-Minerals (MULTIVITAMIN PO) Take by mouth daily.    Marland Kitchen OVER THE COUNTER MEDICATION Bone strenght vitamin with Calcium and Magnesium    . pravastatin (PRAVACHOL) 40 MG tablet take 1 tablet by mouth at bedtime for cholesterol 90 tablet 3  . furosemide (LASIX) 40 MG tablet Take 1 tablet (40 mg total) by mouth 2 (two) times daily. (  Patient not taking: Reported on 08/18/2014) 180 tablet 1  . potassium chloride SA (K-DUR,KLOR-CON) 20 MEQ tablet Take 20 mEq by mouth 2 (two) times daily.     No current facility-administered medications on file prior to visit.   Medical History:  Past Medical History  Diagnosis Date  . Hypertension   . Hyperlipidemia   . Allergy   . Asthma   . Vitamin D deficiency   . Prediabetes   . Obesity   . Migraine    Allergies:  Allergies  Allergen Reactions  . Ace Inhibitors     cough  . Azithromycin     rash  .  Prednisone     High dose  . Vicodin [Hydrocodone-Acetaminophen]     Nausea/vomiting    Review of Systems:  Review of Systems  Constitutional: Negative.   HENT: Positive for sore throat. Negative for congestion, ear discharge, ear pain, hearing loss, nosebleeds and tinnitus.   Respiratory: Negative.  Negative for stridor.   Cardiovascular: Negative.   Gastrointestinal: Negative.   Genitourinary: Negative.   Musculoskeletal: Positive for back pain. Negative for myalgias, joint pain, falls and neck pain.  Skin: Negative.   Neurological: Negative.  Negative for headaches.  Psychiatric/Behavioral: Negative.      Family history- Review and unchanged Social history- Review and unchanged Physical Exam: BP 128/82 mmHg  Pulse 68  Temp(Src) 97.7 F (36.5 C)  Resp 16  Ht 5' 4.5" (1.638 m)  Wt 199 lb (90.266 kg)  BMI 33.64 kg/m2 Wt Readings from Last 3 Encounters:  08/18/14 199 lb (90.266 kg)  05/12/14 203 lb 12.8 oz (92.443 kg)  02/05/14 201 lb (91.173 kg)   General Appearance: Well nourished, in no apparent distress. Eyes: PERRLA, EOMs, conjunctiva no swelling or erythema Sinuses: No Frontal/maxillary tenderness ENT/Mouth: Ext aud canals clear, TMs without erythema, bulging. No erythema, swelling, or exudate on post pharynx.  Tonsils not swollen or erythematous. Hearing normal.  Neck: Supple, thyroid normal.  Respiratory: Respiratory effort normal, BS equal bilaterally without rales, rhonchi, wheezing or stridor.  Cardio: RRR with no MRGs. Brisk peripheral pulses without edema.  Abdomen: Soft, + BS.  Non tender, no guarding, rebound, hernias, masses. Lymphatics: Non tender without lymphadenopathy.  Musculoskeletal: Full ROM, 5/5 strength, normal gait.  Skin: Warm, dry without rashes, lesions, ecchymosis.  Neuro: Cranial nerves intact. Normal muscle tone, no cerebellar symptoms. Sensation intact.  Psych: Awake and oriented X 3, normal affect, Insight and Judgment appropriate.     Vicie Mutters, PA-C 5:00 PM [facility]

## 2014-08-19 LAB — HEPATIC FUNCTION PANEL
ALT: 19 U/L (ref 0–35)
AST: 19 U/L (ref 0–37)
Albumin: 4.4 g/dL (ref 3.5–5.2)
Alkaline Phosphatase: 84 U/L (ref 39–117)
BILIRUBIN INDIRECT: 0.3 mg/dL (ref 0.2–1.2)
Bilirubin, Direct: 0.1 mg/dL (ref 0.0–0.3)
TOTAL PROTEIN: 6.7 g/dL (ref 6.0–8.3)
Total Bilirubin: 0.4 mg/dL (ref 0.2–1.2)

## 2014-08-19 LAB — CBC WITH DIFFERENTIAL/PLATELET
BASOS PCT: 1 % (ref 0–1)
Basophils Absolute: 0.1 10*3/uL (ref 0.0–0.1)
EOS ABS: 0.3 10*3/uL (ref 0.0–0.7)
Eosinophils Relative: 3 % (ref 0–5)
HEMATOCRIT: 40.8 % (ref 36.0–46.0)
Hemoglobin: 13.5 g/dL (ref 12.0–15.0)
Lymphocytes Relative: 32 % (ref 12–46)
Lymphs Abs: 2.8 10*3/uL (ref 0.7–4.0)
MCH: 30.1 pg (ref 26.0–34.0)
MCHC: 33.1 g/dL (ref 30.0–36.0)
MCV: 91.1 fL (ref 78.0–100.0)
MONOS PCT: 6 % (ref 3–12)
MPV: 10.9 fL (ref 8.6–12.4)
Monocytes Absolute: 0.5 10*3/uL (ref 0.1–1.0)
NEUTROS ABS: 5.1 10*3/uL (ref 1.7–7.7)
Neutrophils Relative %: 58 % (ref 43–77)
Platelets: 254 10*3/uL (ref 150–400)
RBC: 4.48 MIL/uL (ref 3.87–5.11)
RDW: 13.3 % (ref 11.5–15.5)
WBC: 8.8 10*3/uL (ref 4.0–10.5)

## 2014-08-19 LAB — BASIC METABOLIC PANEL WITH GFR
BUN: 18 mg/dL (ref 6–23)
CHLORIDE: 105 meq/L (ref 96–112)
CO2: 25 meq/L (ref 19–32)
Calcium: 9.5 mg/dL (ref 8.4–10.5)
Creat: 0.79 mg/dL (ref 0.50–1.10)
GFR, Est Non African American: 85 mL/min
Glucose, Bld: 76 mg/dL (ref 70–99)
POTASSIUM: 4.1 meq/L (ref 3.5–5.3)
SODIUM: 141 meq/L (ref 135–145)

## 2014-08-19 LAB — LIPID PANEL
CHOL/HDL RATIO: 3.9 ratio
Cholesterol: 155 mg/dL (ref 0–200)
HDL: 40 mg/dL (ref >39)
LDL Cholesterol: 91 mg/dL (ref 0–99)
Triglycerides: 119 mg/dL (ref <150)
VLDL: 24 mg/dL (ref 0–40)

## 2014-08-19 LAB — INSULIN, FASTING: Insulin fasting, serum: 13 u[IU]/mL (ref 2.0–19.6)

## 2014-08-19 LAB — VITAMIN D 25 HYDROXY (VIT D DEFICIENCY, FRACTURES): Vit D, 25-Hydroxy: 48 ng/mL (ref 30–100)

## 2014-08-19 LAB — TSH: TSH: 2.802 u[IU]/mL (ref 0.350–4.500)

## 2014-08-19 LAB — MAGNESIUM: Magnesium: 2 mg/dL (ref 1.5–2.5)

## 2014-11-24 ENCOUNTER — Ambulatory Visit (INDEPENDENT_AMBULATORY_CARE_PROVIDER_SITE_OTHER): Payer: BLUE CROSS/BLUE SHIELD | Admitting: Internal Medicine

## 2014-11-24 ENCOUNTER — Encounter: Payer: Self-pay | Admitting: Internal Medicine

## 2014-11-24 VITALS — BP 134/82 | HR 68 | Temp 97.6°F | Resp 16 | Ht 63.5 in | Wt 207.6 lb

## 2014-11-24 DIAGNOSIS — R7309 Other abnormal glucose: Secondary | ICD-10-CM

## 2014-11-24 DIAGNOSIS — I10 Essential (primary) hypertension: Secondary | ICD-10-CM

## 2014-11-24 DIAGNOSIS — Z79899 Other long term (current) drug therapy: Secondary | ICD-10-CM

## 2014-11-24 DIAGNOSIS — R7303 Prediabetes: Secondary | ICD-10-CM

## 2014-11-24 DIAGNOSIS — E559 Vitamin D deficiency, unspecified: Secondary | ICD-10-CM

## 2014-11-24 DIAGNOSIS — E785 Hyperlipidemia, unspecified: Secondary | ICD-10-CM

## 2014-11-24 LAB — BASIC METABOLIC PANEL WITH GFR
BUN: 20 mg/dL (ref 6–23)
CO2: 25 mEq/L (ref 19–32)
Calcium: 9.6 mg/dL (ref 8.4–10.5)
Chloride: 105 mEq/L (ref 96–112)
Creat: 0.8 mg/dL (ref 0.50–1.10)
GFR, Est African American: >89 mL/min
GFR, Est Non African American: 83 mL/min
GLUCOSE: 92 mg/dL (ref 70–99)
Potassium: 4.2 mEq/L (ref 3.5–5.3)
Sodium: 142 mEq/L (ref 135–145)

## 2014-11-24 LAB — HEPATIC FUNCTION PANEL
ALBUMIN: 4.4 g/dL (ref 3.5–5.2)
ALT: 25 U/L (ref 0–35)
AST: 22 U/L (ref 0–37)
Alkaline Phosphatase: 81 U/L (ref 39–117)
Bilirubin, Direct: 0.1 mg/dL (ref 0.0–0.3)
Indirect Bilirubin: 0.3 mg/dL (ref 0.2–1.2)
Total Bilirubin: 0.4 mg/dL (ref 0.2–1.2)
Total Protein: 6.5 g/dL (ref 6.0–8.3)

## 2014-11-24 LAB — CBC WITH DIFFERENTIAL/PLATELET
BASOS ABS: 0.1 10*3/uL (ref 0.0–0.1)
BASOS PCT: 1 % (ref 0–1)
EOS ABS: 0.2 10*3/uL (ref 0.0–0.7)
EOS PCT: 3 % (ref 0–5)
HCT: 39.3 % (ref 36.0–46.0)
Hemoglobin: 13.5 g/dL (ref 12.0–15.0)
LYMPHS ABS: 2.5 10*3/uL (ref 0.7–4.0)
Lymphocytes Relative: 31 % (ref 12–46)
MCH: 30.8 pg (ref 26.0–34.0)
MCHC: 34.4 g/dL (ref 30.0–36.0)
MCV: 89.5 fL (ref 78.0–100.0)
MPV: 10.5 fL (ref 8.6–12.4)
Monocytes Absolute: 0.5 10*3/uL (ref 0.1–1.0)
Monocytes Relative: 6 % (ref 3–12)
NEUTROS PCT: 59 % (ref 43–77)
Neutro Abs: 4.7 10*3/uL (ref 1.7–7.7)
PLATELETS: 239 10*3/uL (ref 150–400)
RBC: 4.39 MIL/uL (ref 3.87–5.11)
RDW: 13.6 % (ref 11.5–15.5)
WBC: 8 10*3/uL (ref 4.0–10.5)

## 2014-11-24 LAB — LIPID PANEL
CHOL/HDL RATIO: 4.1 ratio
Cholesterol: 159 mg/dL (ref 0–200)
HDL: 39 mg/dL — ABNORMAL LOW (ref >=46)
LDL Cholesterol: 67 mg/dL (ref 0–99)
Triglycerides: 267 mg/dL — ABNORMAL HIGH (ref <150)
VLDL: 53 mg/dL — ABNORMAL HIGH (ref 0–40)

## 2014-11-24 LAB — MAGNESIUM: MAGNESIUM: 2.1 mg/dL (ref 1.5–2.5)

## 2014-11-24 LAB — TSH: TSH: 2.428 u[IU]/mL (ref 0.350–4.500)

## 2014-11-24 LAB — HEMOGLOBIN A1C
Hgb A1c MFr Bld: 5.6 % (ref <5.7)
MEAN PLASMA GLUCOSE: 114 mg/dL (ref <117)

## 2014-11-24 NOTE — Patient Instructions (Signed)

## 2014-11-25 LAB — VITAMIN D 25 HYDROXY (VIT D DEFICIENCY, FRACTURES): VIT D 25 HYDROXY: 55 ng/mL (ref 30–100)

## 2014-11-25 LAB — INSULIN, RANDOM: INSULIN: 53.4 u[IU]/mL — AB (ref 2.0–19.6)

## 2014-11-25 NOTE — Progress Notes (Signed)
Patient ID: Rhonda [name], female   DOB: January 19, 1961, 54 y.o.   MRN: [id number]   This very nice 54 y.o. DWF presents for 3 month follow up with Hypertension, Hyperlipidemia, Pre-Diabetes and Vitamin D Deficiency.    Patient is treated for HTN since 2007 & BP has been controlled at home. Today's BP: 134/82 mmHg. Patient has had no complaints of any cardiac type chest pain, palpitations, dyspnea/orthopnea/PND, dizziness, claudication, or dependent edema.   Hyperlipidemia is controlled with diet & meds. Patient denies myalgias or other med SE's. Last Lipids were at goal -  Total Chol 159; HDL-39; LDL  67;and elevated Trig 267 on 11/24/2014.   Also, the patient has history of PreDiabetes / Insulin Resistance with Nl A1c 5.5% and elevated insulin of 37 in Sept 2012  and has had no symptoms of reactive hypoglycemia, diabetic polys, paresthesias or visual blurring.  Last A1c was  5.6% on 11/24/2014.    Further, the patient also has history of Vitamin D Deficiency of 22 in 2008 and supplements vitamin D without any suspected side-effects. Last vitamin D was  48 on 08/18/2014.  Medication Sig  . albuterol  HFA inhaler Inhale into the lungs every 6 (six) hours as needed for wheezing or shortness of breath.  . bisoprolol-hctz (ZIAC) 5-6.25 MG  Take 1/2 to 1 tablet daily for BP  . SYMBICORT)160-4.5  inhaler Inhale 2 puffs into the lungs 2 (two) times daily.  Marland Kitchen VITAMIN D 1000 UNITS tablet Take 5,000 Units by mouth daily.  Marland Kitchen glucosamine-chondroitin 500-400 MG  Take 1 tablet by mouth daily.  Marland Kitchen losartan  100 MG tablet take 1 tablet by mouth once daily  . montelukast 10 MG tablet Take 10 mg by mouth at bedtime.  . Multiple Vitamins-Minerals (MULTIVITAMIN PO) Take by mouth daily.  Marland Kitchen OVER THE COUNTER MEDICATION Bone strenght vitamin with Calcium and Magnesium  . pravastatin  40 MG tablet take 1 tablet by mouth at bedtime for cholesterol  . furosemide40 MG tablet Take 1 tablet (40 mg total) by mouth 2 (two) times  daily. (Patient not taking: Reported on 08/18/2014)  . potassium chloride SA (K-DUR,KLOR-CON) 20 MEQ tablet Take 20 mEq by mouth 2 (two) times daily.   Allergies  Allergen Reactions  . Ace Inhibitors     cough  . Azithromycin     rash  . Prednisone     High dose  . Vicodin [Hydrocodone-Acetaminophen]     Nausea/vomiting   PMHx:   Past Medical History  Diagnosis Date  . Hypertension   . Hyperlipidemia   . Allergy   . Asthma   . Vitamin D deficiency   . Prediabetes   . Obesity   . Migraine    Immunization History  Administered Date(s) Administered  . PPD Test 05/12/2014  . Pneumococcal-Unspecified 08/29/2009  . Tdap 08/30/2007   Past Surgical History  Procedure Laterality Date  . Dilation and curettage, diagnostic / therapeutic  2009  . Nevus excision  2012    dysplastic from back  . Lymph node biopsy Left 1991    negative   FHx:    Reviewed / unchanged  SHx:    Reviewed / unchanged  Systems Review:  Constitutional: Denies fever, chills, wt changes, headaches, insomnia, fatigue, night sweats, change in appetite. Eyes: Denies redness, blurred vision, diplopia, discharge, itchy, watery eyes.  ENT: Denies discharge, congestion, post nasal drip, epistaxis, sore throat, earache, hearing loss, dental pain, tinnitus, vertigo, sinus pain, snoring.  CV: Denies chest pain,  palpitations, irregular heartbeat, syncope, dyspnea, diaphoresis, orthopnea, PND, claudication or edema. Respiratory: denies cough, dyspnea, DOE, pleurisy, hoarseness, laryngitis, wheezing.  Gastrointestinal: Denies dysphagia, odynophagia, heartburn, reflux, water brash, abdominal pain or cramps, nausea, vomiting, bloating, diarrhea, constipation, hematemesis, melena, hematochezia  or hemorrhoids. Genitourinary: Denies dysuria, frequency, urgency, nocturia, hesitancy, discharge, hematuria or flank pain. Musculoskeletal: Denies arthralgias, myalgias, stiffness, jt. swelling, pain, limping or strain/sprain.   Skin: Denies pruritus, rash, hives, warts, acne, eczema or change in skin lesion(s). Neuro: No weakness, tremor, incoordination, spasms, paresthesia or pain. Psychiatric: Denies confusion, memory loss or sensory loss. Endo: Denies change in weight, skin or hair change.  Heme/Lymph: No excessive bleeding, bruising or enlarged lymph nodes.  Physical Exam  BP 134/82   Pulse 68  Temp 97.6 F   Resp 16  Ht 5' 3.5"   Wt 207 lb 9.6 oz      BMI 36.19   Appears well nourished and in no distress. Eyes: PERRLA, EOMs, conjunctiva no swelling or erythema. Sinuses: No frontal/maxillary tenderness ENT/Mouth: EAC's clear, TM's nl w/o erythema, bulging. Nares clear w/o erythema, swelling, exudates. Oropharynx clear without erythema or exudates. Oral hygiene is good. Tongue normal, non obstructing. Hearing intact.  Neck: Supple. Thyroid nl. Car 2+/2+ without bruits, nodes or JVD. Chest: Respirations nl with BS clear & equal w/o rales, rhonchi, wheezing or stridor.  Cor: Heart sounds normal w/ regular rate and rhythm without sig. murmurs, gallops, clicks, or rubs. Peripheral pulses normal and equal  without edema.  Abdomen: Soft & bowel sounds normal. Non-tender w/o guarding, rebound, hernias, masses, or organomegaly.  Lymphatics: Unremarkable.  Musculoskeletal: Full ROM all peripheral extremities, joint stability, 5/5 strength, and normal gait.  Skin: Warm, dry without exposed rashes, lesions or ecchymosis apparent.  Neuro: Cranial nerves intact, reflexes equal bilaterally. Sensory-motor testing grossly intact. Tendon reflexes grossly intact.  Pysch: Alert & oriented x 3.  Insight and judgement nl & appropriate. No ideations.  Assessment and Plan:  1. Essential hypertension  - TSH  2. Hyperlipidemia  - Lipid panel  3. Prediabetes  - Hemoglobin A1c - Insulin, random  4. Vitamin D deficiency  - Vit D  25 hydroxy (rtn osteoporosis monitoring)  5. Medication management  - CBC with  Differential/Platelet - BASIC METABOLIC PANEL WITH GFR - Hepatic function panel - Magnesium   Recommended regular exercise, BP monitoring, weight control, and discussed med and SE's. Recommended labs to assess and monitor clinical status. Further disposition pending results of labs. Over 30 minutes of exam, counseling, chart review was performed

## 2015-02-12 ENCOUNTER — Encounter: Payer: Self-pay | Admitting: Internal Medicine

## 2015-02-12 ENCOUNTER — Ambulatory Visit (INDEPENDENT_AMBULATORY_CARE_PROVIDER_SITE_OTHER): Payer: BLUE CROSS/BLUE SHIELD | Admitting: Internal Medicine

## 2015-02-12 VITALS — BP 144/70 | HR 68 | Temp 98.0°F | Resp 16 | Ht 63.5 in | Wt 205.0 lb

## 2015-02-12 DIAGNOSIS — R7303 Prediabetes: Secondary | ICD-10-CM

## 2015-02-12 DIAGNOSIS — E559 Vitamin D deficiency, unspecified: Secondary | ICD-10-CM

## 2015-02-12 DIAGNOSIS — R7309 Other abnormal glucose: Secondary | ICD-10-CM

## 2015-02-12 DIAGNOSIS — Z79899 Other long term (current) drug therapy: Secondary | ICD-10-CM

## 2015-02-12 DIAGNOSIS — E785 Hyperlipidemia, unspecified: Secondary | ICD-10-CM

## 2015-02-12 DIAGNOSIS — E669 Obesity, unspecified: Secondary | ICD-10-CM

## 2015-02-12 DIAGNOSIS — I10 Essential (primary) hypertension: Secondary | ICD-10-CM

## 2015-02-12 DIAGNOSIS — S86899A Other injury of other muscle(s) and tendon(s) at lower leg level, unspecified leg, initial encounter: Secondary | ICD-10-CM

## 2015-02-12 LAB — CBC WITH DIFFERENTIAL/PLATELET
BASOS PCT: 1 % (ref 0–1)
Basophils Absolute: 0.1 10*3/uL (ref 0.0–0.1)
EOS ABS: 0.1 10*3/uL (ref 0.0–0.7)
Eosinophils Relative: 2 % (ref 0–5)
HEMATOCRIT: 40.7 % (ref 36.0–46.0)
Hemoglobin: 13.8 g/dL (ref 12.0–15.0)
Lymphocytes Relative: 26 % (ref 12–46)
Lymphs Abs: 1.7 10*3/uL (ref 0.7–4.0)
MCH: 31.1 pg (ref 26.0–34.0)
MCHC: 33.9 g/dL (ref 30.0–36.0)
MCV: 91.7 fL (ref 78.0–100.0)
MONO ABS: 0.4 10*3/uL (ref 0.1–1.0)
MPV: 10.3 fL (ref 8.6–12.4)
Monocytes Relative: 6 % (ref 3–12)
NEUTROS PCT: 65 % (ref 43–77)
Neutro Abs: 4.4 10*3/uL (ref 1.7–7.7)
Platelets: 221 10*3/uL (ref 150–400)
RBC: 4.44 MIL/uL (ref 3.87–5.11)
RDW: 13.8 % (ref 11.5–15.5)
WBC: 6.7 10*3/uL (ref 4.0–10.5)

## 2015-02-12 LAB — BASIC METABOLIC PANEL WITH GFR
BUN: 24 mg/dL — AB (ref 6–23)
CALCIUM: 9.6 mg/dL (ref 8.4–10.5)
CO2: 27 mEq/L (ref 19–32)
Chloride: 104 mEq/L (ref 96–112)
Creat: 0.81 mg/dL (ref 0.50–1.10)
GFR, Est Non African American: 82 mL/min
GLUCOSE: 84 mg/dL (ref 70–99)
POTASSIUM: 4.2 meq/L (ref 3.5–5.3)
SODIUM: 142 meq/L (ref 135–145)

## 2015-02-12 LAB — HEMOGLOBIN A1C
HEMOGLOBIN A1C: 5.5 % (ref <5.7)
Mean Plasma Glucose: 111 mg/dL (ref <117)

## 2015-02-12 LAB — TSH: TSH: 2.473 u[IU]/mL (ref 0.350–4.500)

## 2015-02-12 LAB — HEPATIC FUNCTION PANEL
ALT: 20 U/L (ref 0–35)
AST: 19 U/L (ref 0–37)
Albumin: 4.3 g/dL (ref 3.5–5.2)
Alkaline Phosphatase: 83 U/L (ref 39–117)
BILIRUBIN DIRECT: 0.1 mg/dL (ref 0.0–0.3)
BILIRUBIN INDIRECT: 0.3 mg/dL (ref 0.2–1.2)
Total Bilirubin: 0.4 mg/dL (ref 0.2–1.2)
Total Protein: 6.5 g/dL (ref 6.0–8.3)

## 2015-02-12 LAB — LIPID PANEL
CHOL/HDL RATIO: 3.8 ratio
CHOLESTEROL: 165 mg/dL (ref 0–200)
HDL: 43 mg/dL — ABNORMAL LOW (ref >=46)
LDL Cholesterol: 89 mg/dL (ref 0–99)
Triglycerides: 166 mg/dL — ABNORMAL HIGH (ref <150)
VLDL: 33 mg/dL (ref 0–40)

## 2015-02-12 LAB — MAGNESIUM: Magnesium: 2.3 mg/dL (ref 1.5–2.5)

## 2015-02-12 NOTE — Patient Instructions (Signed)
Ways to cut 100 calories  1. Eat your eggs with hot sauce OR salsa instead of cheese.  Eggs are great for breakfast, but many people consider eggs and cheese to be BFFs. Instead of cheese-1 oz. of cheddar has 114 calories-top your eggs with hot sauce, which contains no calories and helps with satiety and metabolism. Salsa is also a great option!!  2. Top your toast, waffles or pancakes with mashed berries instead of jelly or syrup. Half a cup of berries-fresh, frozen or thawed-has about 40 calories, compared with 2 tbsp. of maple syrup or jelly, which both have about 100 calories. The berries will also give you a good punch of fiber, which helps keep you full and satisfied and won't spike blood sugar quickly like the jelly or syrup. 3. Swap the non-fat latte for black coffee with a splash of half-and-half. Contrary to its name, that non-fat latte has 130 calories and a startling 19g of carbohydrates per 16 oz. serving. Replacing that 'light' drinkable dessert with a black coffee with a splash of half-and-half saves you more than 100 calories per 16 oz. serving. 4. Sprinkle salads with freeze-dried raspberries instead of dried cranberries. If you want a sweet addition to your nutritious salad, stay away from dried cranberries. They have a whopping 130 calories per  cup and 30g carbohydrates. Instead, sprinkle freeze-dried raspberries guilt-free and save more than 100 calories per  cup serving, adding 3g of belly-filling fiber. 5. Go for mustard in place of mayo on your sandwich. Mustard can add really nice flavor to any sandwich, and there are tons of varieties, from spicy to honey. A serving of mayo is 95 calories, versus 10 calories in a serving of mustard. 6. Choose a DIY salad dressing instead of the store-bought kind. Mix Dijon or whole grain mustard with low-fat Kefir or red wine vinegar and garlic. 7. Use hummus as a spread instead of a dip. Use hummus as a spread on a high-fiber cracker or  tortilla with a sandwich and save on calories without sacrificing taste. 8. Pick just one salad "accessory." Salad isn't automatically a calorie winner. It's easy to over-accessorize with toppings. Instead of topping your salad with nuts, avocado and cranberries (all three will clock in at 313 calories), just pick one. The next day, choose a different accessory, which will also keep your salad interesting. You don't wear all your jewelry every day, right? 9. Ditch the white pasta in favor of spaghetti squash. One cup of cooked spaghetti squash has about 40 calories, compared with traditional spaghetti, which comes with more than 200. Spaghetti squash is also nutrient-dense. It's a good source of fiber and Vitamins A and C, and it can be eaten just like you would eat pasta-with a great tomato sauce and [place] meatballs or with pesto, tofu and spinach, for example. 10. Dress up your chili, soups and stews with non-fat [place] yogurt instead of sour cream. Just a 'dollop' of sour cream can set you back 115 calories and a whopping 12g of fat-seven of which are of the artery-clogging variety. Added bonus: [place] yogurt is packed with muscle-building protein, calcium and B Vitamins. 11. Mash cauliflower instead of mashed potatoes. One cup of traditional mashed potatoes-in all their creamy goodness-has more than 200 calories, compared to mashed cauliflower, which you can typically eat for less than 100 calories per 1 cup serving. Cauliflower is a great source of the antioxidant indole-3-carbinol (I3C), which may help reduce the risk of some cancers, like breast  cancer. 12. Ditch the ice cream sundae in favor of a Greek yogurt parfait. Instead of a cup of ice cream or fro-yo for dessert, try 1 cup of nonfat Greek yogurt topped with fresh berries and a sprinkle of cacao nibs. Both toppings are packed with antioxidants, which can help reduce cellular inflammation and oxidative damage. And the comparison is a  no-brainer: One cup of ice cream has about 275 calories; one cup of frozen yogurt has about 230; and a cup of Greek yogurt has just 130, plus twice the protein, so you're less likely to return to the freezer for a second helping. 13. Put olive oil in a spray container instead of using it directly from the bottle. Each tablespoon of olive oil is 120 calories and 15g of fat. Use a mister instead of pouring it straight into the pan or onto a salad. This allows for portion control and will save you more than 100 calories. 14. When baking, substitute canned pumpkin for butter or oil. Canned pumpkin-not pumpkin pie mix-is loaded with Vitamin A, which is important for skin and eye health, as well as immunity. And the comparisons are pretty crazy:  cup of canned pumpkin has about 40 calories, compared to butter or oil, which has more than 800 calories. Yes, 800 calories. Applesauce and mashed banana can also serve as good substitutions for butter or oil, usually in a 1:1 ratio. 15. Top casseroles with high-fiber cereal instead of breadcrumbs. Breadcrumbs are typically made with white bread, while breakfast cereals contain 5-9g of fiber per serving. Not only will you save more than 150 calories per  cup serving, the swap will also keep you more full and you'll get a metabolism boost from the added fiber. 16. Snack on pistachios instead of macadamia nuts. Believe it or not, you get the same amount of calories from 35 pistachios (100 calories) as you would from only five macadamia nuts. 17. Chow down on kale chips rather than potato chips. This is my favorite 'don't knock it 'till you try it' swap. Kale chips are so easy to make at home, and you can spice them up with a little grated parmesan or chili powder. Plus, they're a mere fraction of the calories of potato chips, but with the same crunch factor we crave so often. 18. Add seltzer and some fruit slices to your cocktail instead of soda or fruit juice. One  cup of soda or fruit juice can pack on as much as 140 calories. Instead, use seltzer and fruit slices. The fruit provides valuable phytochemicals, such as flavonoids and anthocyanins, which help to combat cancer and stave off the aging process.  We want weight loss that will last so you should lose 1-2 pounds a week.  THAT IS IT! Please pick THREE things a month to change. Once it is a habit check off the item. Then pick another three items off the list to become habits.  If you are already doing a habit on the list GREAT!  Cross that item off! o Don't drink your calories. Ie, alcohol, soda, fruit juice, and sweet tea.  o Drink more water. Drink a glass when you feel hungry or before each meal.  o Eat breakfast - Complex carb and protein (likeDannon light and fit yogurt, oatmeal, fruit, eggs, turkey bacon). o Measure your cereal.  Eat no more than one cup a day. (ie Kashi) o Eat an apple a day. o Add a vegetable a day. o Try a new vegetable   a month. o Use Pam! Stop using oil or butter to cook. o Don't finish your plate or use smaller plates. o Share your dessert. o Eat sugar free Jello for dessert or frozen grapes. o Don't eat 2-3 hours before bed. o Switch to whole wheat bread, pasta, and brown rice. o Make healthier choices when you eat out. No fries! o Pick baked chicken, NOT fried. o Don't forget to SLOW DOWN when you eat. It is not going anywhere.  o Take the stairs. o Park far away in the parking lot o Lift soup cans (or weights) for 10 minutes while watching TV. o Walk at work for 10 minutes during break. o Walk outside 1 time a week with your friend, kids, dog, or significant other. o Start a walking group at church. o Walk the mall as much as you can tolerate.  o Keep a food diary. o Weigh yourself daily. o Walk for 15 minutes 3 days per week. o Cook at home more often and eat out less.  If life happens and you go back to old habits, it is okay.  Just start over. You can do  it!   If you experience chest pain, get short of breath, or tired during the exercise, please stop immediately and inform your doctor.  

## 2015-02-12 NOTE — Progress Notes (Signed)
Patient ID: Rhonda [name], female   DOB: [birth year], 54 y.o.   MRN: [id number]  Assessment and Plan:  Hypertension:  -Continue medication,  -monitor blood pressure at home.  -Continue DASH diet.   -Reminder to go to the ER if any CP, SOB, nausea, dizziness, severe HA, changes vision/speech, left arm numbness and tingling, and jaw pain.  Cholesterol: -Continue diet and exercise.  -Check cholesterol.   Pre-diabetes: -Continue diet and exercise.  -Check A1C  Vitamin D Def: -check level -continue medications.  Shin Splints -neoprene shin wraps -ice -NSAIDs prn -change out run shoes   Continue diet and meds as discussed. Further disposition pending results of labs.  HPI 54 y.o. female  presents for 3 month follow up with hypertension, hyperlipidemia, prediabetes and vitamin D.   Her blood pressure has been controlled at home, today their BP is BP: (!) 144/70 mmHg.   She does workout.  She does a running group with women at her church.  She denies chest pain, shortness of breath, dizziness.   She is on cholesterol medication and denies myalgias. Her cholesterol is at goal. The cholesterol last visit was:   Lab Results  Component Value Date   CHOL 159 11/24/2014   HDL 39* 11/24/2014   LDLCALC 67 11/24/2014   TRIG 267* 11/24/2014   CHOLHDL 4.1 11/24/2014     She has been working on diet and exercise for prediabetes, and denies foot ulcerations, hyperglycemia, hypoglycemia , increased appetite, nausea, paresthesia of the feet, polydipsia, polyuria, visual disturbances, vomiting and weight loss. Last A1C in the office was:  Lab Results  Component Value Date   HGBA1C 5.6 11/24/2014  She reports that she has been doing a lot more salads for lunch.  She has been increasing her veggies.  She reports that she has been having some red meat sparingly.    Patient is on Vitamin D supplement.  Lab Results  Component Value Date   VD25OH 55 11/24/2014      Current Medications:   Current Outpatient Prescriptions on File Prior to Visit  Medication Sig Dispense Refill  . albuterol (PROVENTIL HFA;VENTOLIN HFA) 108 (90 BASE) MCG/ACT inhaler Inhale into the lungs every 6 (six) hours as needed for wheezing or shortness of breath.    . bisoprolol-hydrochlorothiazide (ZIAC) 5-6.25 MG per tablet Take 1/2 to 1 tablet daily for BP 90 tablet 99  . budesonide-formoterol (SYMBICORT) 160-4.5 MCG/ACT inhaler Inhale 2 puffs into the lungs 2 (two) times daily.    . cholecalciferol (VITAMIN D) 1000 UNITS tablet Take 5,000 Units by mouth daily.    Marland Kitchen glucosamine-chondroitin 500-400 MG tablet Take 1 tablet by mouth daily.    Marland Kitchen losartan (COZAAR) 100 MG tablet take 1 tablet by mouth once daily 90 tablet 3  . montelukast (SINGULAIR) 10 MG tablet Take 10 mg by mouth at bedtime.    . Multiple Vitamins-Minerals (MULTIVITAMIN PO) Take by mouth daily.    Marland Kitchen OVER THE COUNTER MEDICATION Bone strenght vitamin with Calcium and Magnesium    . pravastatin (PRAVACHOL) 40 MG tablet take 1 tablet by mouth at bedtime for cholesterol 90 tablet 3   No current facility-administered medications on file prior to visit.    Medical History:  Past Medical History  Diagnosis Date  . Hypertension   . Hyperlipidemia   . Allergy   . Asthma   . Vitamin D deficiency   . Prediabetes   . Obesity   . Migraine     Allergies:  Allergies  Allergen Reactions  . Ace Inhibitors     cough  . Azithromycin     rash  . Prednisone     High dose  . Vicodin [Hydrocodone-Acetaminophen]     Nausea/vomiting     Review of Systems:  Review of Systems  Constitutional: Negative for fever, chills and malaise/fatigue.  HENT: Negative for congestion, ear pain and sore throat.   Eyes: Negative.   Respiratory: Negative for cough, shortness of breath and wheezing.   Cardiovascular: Negative for chest pain, palpitations and leg swelling.  Gastrointestinal: Negative for heartburn, diarrhea, constipation, blood in stool and  melena.  Genitourinary: Negative.   Skin: Negative.   Neurological: Negative for dizziness, sensory change, loss of consciousness and headaches.  Psychiatric/Behavioral: Negative for depression. The patient is not nervous/anxious and does not have insomnia.     Family history- Review and unchanged  Social history- Review and unchanged  Physical Exam: BP 144/70 mmHg  Pulse 68  Temp(Src) 98 F (36.7 C) (Temporal)  Resp 16  Ht 5' 3.5" (1.613 m)  Wt 205 lb (92.987 kg)  BMI 35.74 kg/m2 Wt Readings from Last 3 Encounters:  02/12/15 205 lb (92.987 kg)  11/24/14 207 lb 9.6 oz (94.167 kg)  08/18/14 199 lb (90.266 kg)    General Appearance: Well nourished well developed, in no apparent distress. Eyes: PERRLA, EOMs, conjunctiva no swelling or erythema ENT/Mouth: Ear canals normal without obstruction, swelling, erythma, discharge.  TMs normal bilaterally.  Oropharynx moist, clear, without exudate, or postoropharyngeal swelling. Neck: Supple, thyroid normal,no cervical adenopathy  Respiratory: Respiratory effort normal, Breath sounds clear A&P without rhonchi, wheeze, or rale.  No retractions, no accessory usage. Cardio: RRR with no MRGs. Brisk peripheral pulses without edema.  Abdomen: Obese, Soft, + BS,  Non tender, no guarding, rebound, hernias, masses. Musculoskeletal: Full ROM, 5/5 strength, Normal gait, bilateral shins unremarkable to inspection, Full ROM of ankles and knees, mildly tender to palpation. Skin: Warm, dry without rashes, lesions, ecchymosis.  Neuro: Awake and oriented X 3, Cranial nerves intact. Normal muscle tone, no cerebellar symptoms. Psych: Normal affect, Insight and Judgment appropriate.    Starlyn Skeans, PA-C 10:16 AM [facility]

## 2015-02-13 LAB — VITAMIN D 25 HYDROXY (VIT D DEFICIENCY, FRACTURES): Vit D, 25-Hydroxy: 63 ng/mL (ref 30–100)

## 2015-02-13 LAB — INSULIN, RANDOM: Insulin: 18.6 u[IU]/mL (ref 2.0–19.6)

## 2015-02-17 ENCOUNTER — Other Ambulatory Visit: Payer: Self-pay

## 2015-02-17 DIAGNOSIS — Z9289 Personal history of other medical treatment: Secondary | ICD-10-CM

## 2015-03-15 ENCOUNTER — Ambulatory Visit
Admission: RE | Admit: 2015-03-15 | Discharge: 2015-03-15 | Disposition: A | Discharge status: Home / Self Care | Payer: BLUE CROSS/BLUE SHIELD | Source: Ambulatory Visit

## 2015-03-15 DIAGNOSIS — Z9289 Personal history of other medical treatment: Secondary | ICD-10-CM

## 2015-05-25 ENCOUNTER — Ambulatory Visit (INDEPENDENT_AMBULATORY_CARE_PROVIDER_SITE_OTHER): Payer: BLUE CROSS/BLUE SHIELD | Admitting: Internal Medicine

## 2015-05-25 ENCOUNTER — Encounter: Payer: Self-pay | Admitting: Internal Medicine

## 2015-05-25 VITALS — BP 142/80 | HR 60 | Temp 97.2°F | Resp 16 | Ht 64.75 in | Wt 207.2 lb

## 2015-05-25 DIAGNOSIS — Z0001 Encounter for general adult medical examination with abnormal findings: Secondary | ICD-10-CM

## 2015-05-25 DIAGNOSIS — Z111 Encounter for screening for respiratory tuberculosis: Secondary | ICD-10-CM | POA: Diagnosis not present

## 2015-05-25 DIAGNOSIS — Z79899 Other long term (current) drug therapy: Secondary | ICD-10-CM | POA: Diagnosis not present

## 2015-05-25 DIAGNOSIS — I10 Essential (primary) hypertension: Secondary | ICD-10-CM

## 2015-05-25 DIAGNOSIS — E559 Vitamin D deficiency, unspecified: Secondary | ICD-10-CM | POA: Diagnosis not present

## 2015-05-25 DIAGNOSIS — E785 Hyperlipidemia, unspecified: Secondary | ICD-10-CM | POA: Diagnosis not present

## 2015-05-25 DIAGNOSIS — R6889 Other general symptoms and signs: Secondary | ICD-10-CM | POA: Diagnosis not present

## 2015-05-25 DIAGNOSIS — Z6836 Body mass index (BMI) 36.0-36.9, adult: Secondary | ICD-10-CM

## 2015-05-25 DIAGNOSIS — R5383 Other fatigue: Secondary | ICD-10-CM

## 2015-05-25 DIAGNOSIS — R7303 Prediabetes: Secondary | ICD-10-CM | POA: Diagnosis not present

## 2015-05-25 DIAGNOSIS — Z1212 Encounter for screening for malignant neoplasm of rectum: Secondary | ICD-10-CM

## 2015-05-25 LAB — CBC WITH DIFFERENTIAL/PLATELET
BASOS ABS: 0.1 10*3/uL (ref 0.0–0.1)
Basophils Relative: 1 % (ref 0–1)
EOS ABS: 0.1 10*3/uL (ref 0.0–0.7)
Eosinophils Relative: 2 % (ref 0–5)
HCT: 39.8 % (ref 36.0–46.0)
Hemoglobin: 13.5 g/dL (ref 12.0–15.0)
LYMPHS ABS: 2.2 10*3/uL (ref 0.7–4.0)
LYMPHS PCT: 30 % (ref 12–46)
MCH: 31.3 pg (ref 26.0–34.0)
MCHC: 33.9 g/dL (ref 30.0–36.0)
MCV: 92.1 fL (ref 78.0–100.0)
MPV: 10.3 fL (ref 8.6–12.4)
Monocytes Absolute: 0.4 10*3/uL (ref 0.1–1.0)
Monocytes Relative: 6 % (ref 3–12)
NEUTROS PCT: 61 % (ref 43–77)
Neutro Abs: 4.5 10*3/uL (ref 1.7–7.7)
PLATELETS: 247 10*3/uL (ref 150–400)
RBC: 4.32 MIL/uL (ref 3.87–5.11)
RDW: 13.4 % (ref 11.5–15.5)
WBC: 7.4 10*3/uL (ref 4.0–10.5)

## 2015-05-25 LAB — BASIC METABOLIC PANEL WITH GFR
BUN: 16 mg/dL (ref 7–25)
CHLORIDE: 104 mmol/L (ref 98–110)
CO2: 27 mmol/L (ref 20–31)
Calcium: 9.8 mg/dL (ref 8.6–10.4)
Creat: 1.02 mg/dL (ref 0.50–1.05)
GFR, EST NON AFRICAN AMERICAN: 62 mL/min (ref >=60)
GFR, Est African American: 72 mL/min (ref >=60)
Glucose, Bld: 86 mg/dL (ref 65–99)
POTASSIUM: 4.1 mmol/L (ref 3.5–5.3)
Sodium: 141 mmol/L (ref 135–146)

## 2015-05-25 LAB — IRON AND TIBC
%SAT: 31 % (ref 11–50)
Iron: 96 ug/dL (ref 45–160)
TIBC: 310 ug/dL (ref 250–450)
UIBC: 214 ug/dL (ref 125–400)

## 2015-05-25 LAB — HEPATIC FUNCTION PANEL
ALK PHOS: 78 U/L (ref 33–130)
ALT: 23 U/L (ref 6–29)
AST: 20 U/L (ref 10–35)
Albumin: 4.5 g/dL (ref 3.6–5.1)
BILIRUBIN DIRECT: 0.1 mg/dL (ref <=0.2)
BILIRUBIN INDIRECT: 0.3 mg/dL (ref 0.2–1.2)
BILIRUBIN TOTAL: 0.4 mg/dL (ref 0.2–1.2)
Total Protein: 6.8 g/dL (ref 6.1–8.1)

## 2015-05-25 LAB — LIPID PANEL
CHOL/HDL RATIO: 4.2 ratio (ref <=5.0)
Cholesterol: 169 mg/dL (ref 125–200)
HDL: 40 mg/dL — AB (ref >=46)
LDL CALC: 82 mg/dL (ref <130)
Triglycerides: 234 mg/dL — ABNORMAL HIGH (ref <150)
VLDL: 47 mg/dL — AB (ref <30)

## 2015-05-25 LAB — MAGNESIUM: Magnesium: 2.2 mg/dL (ref 1.5–2.5)

## 2015-05-25 LAB — TSH: TSH: 2.356 u[IU]/mL (ref 0.350–4.500)

## 2015-05-25 LAB — VITAMIN B12: Vitamin B-12: 574 pg/mL (ref 211–911)

## 2015-05-25 NOTE — Progress Notes (Signed)
Patient ID: Rhonda [name], female   DOB: April 17, 1961, 54 y.o.   MRN: [id number]  Preventative Visit And Comprehensive Evaluation,  Examination & Management  This very nice 54 y.o. DWF presents for  presents for a Preventative Visit & comprehensive evaluation and management of multiple medical co-morbidities.  Patient has been followed for HTN, Prediabetes, Hyperlipidemia, and Vitamin D Deficiency.    HTN predates since 2007. Patient's BP has been controlled at home and patient denies any cardiac symptoms as chest pain, palpitations, shortness of breath, dizziness, but does report asymmetric ankle swelling of L>R since tapering her Ziac to qod. Today's BP: (!) 142/80 mmHg    Patient's hyperlipidemia is controlled with diet and medications. Patient denies myalgias or other medication SE's. Last lipids were at goal with Cholesterol 165; HDL 43*; LDL 89; and sl elevated Triglycerides 166 on 02/12/2015.   Patient has prediabetes predating since Sept 2012 with A1c 5.5%, but elevated Insulin 37 and again elevated Insulin at 43 in 04/2012 and 35 in 02/2013 and patient denies reactive hypoglycemic symptoms, visual blurring, diabetic polys, or paresthesias. Last A1c was 5.5% on 02/12/2015.   Finally, patient has history of Vitamin D Deficiency of 22 in 2008 and last Vitamin D was 63 on 02/12/2015.      Medication Sig  . albuterol  HFA  inhaler Inhale into the lungs every 6 (six) hours as needed for wheezing or shortness of breath.  . bisoprolol-hctz [facility]) 5-6.25  Take 1/2 to 1 tablet daily for BP  . SYMBICORT 160-4.5  inhaler Inhale 2 puffs into the lungs 2 (two) times daily.  Marland Kitchen VITAMIN D 1000 UNITS Take 5,000 Units by mouth daily.  Marland Kitchen glucosamine-chondroitin 500-400 MG  Take 1 tablet by mouth daily.  Marland Kitchen losartan  100 MG tablet take 1 tablet by mouth once daily  . Montelukast 10 MG  Take 10 mg by mouth at bedtime.  . Multiple Vitamins-Minerals  Take by mouth daily.  . Bone strenght vit with Cal and Mag   .  pravastatin  40 MG tablet take 1 tablet by mouth at bedtime for cholesterol   Allergies  Allergen Reactions  . Ace Inhibitors     cough  . Azithromycin     rash  . Prednisone     High dose  . Vicodin [Hydrocodone-Acetaminophen]     Nausea/vomiting   Past Medical History  Diagnosis Date  . Hypertension   . Hyperlipidemia   . Allergy   . Asthma   . Vitamin D deficiency   . Prediabetes   . Obesity   . Migraine    Health Maintenance  Topic Date Due  . Hepatitis C Screening  1960-12-16  . HIV Screening  05/31/1974  . COLONOSCOPY  05/31/2009  . PAP SMEAR  12/27/2012  . INFLUENZA VACCINE  03/08/2015  . MAMMOGRAM  03/14/2017  . TETANUS/TDAP  08/29/2017   Immunization History  Administered Date(s) Administered  . PPD Test 05/12/2014  . Pneumococcal-Unspecified 08/29/2009  . Tdap 08/30/2007   Past Surgical History  Procedure Laterality Date  . Dilation and curettage, diagnostic / therapeutic  2009  . Nevus excision  2012    dysplastic from back  . Lymph node biopsy Left 1991    negative   Family History  Problem Relation Age of Onset  . Hypertension Mother   . COPD Father   . Cancer Father     colon  . Heart disease Father   . Hyperlipidemia Father   . Hypertension Father   .  Stroke Father    Social History  Substance Use Topics  . Smoking status: Never Smoker   . Smokeless tobacco: Never Used  . Alcohol Use: No    ROS Constitutional: Denies fever, chills, weight loss/gain, headaches, insomnia,  night sweats, and change in appetite. Does c/o fatigue. Eyes: Denies redness, blurred vision, diplopia, discharge, itchy, watery eyes.  ENT: Denies discharge, congestion, post nasal drip, epistaxis, sore throat, earache, hearing loss, dental pain, Tinnitus, Vertigo, Sinus pain, snoring.  Cardio: Denies chest pain, palpitations, irregular heartbeat, syncope, dyspnea, diaphoresis, orthopnea, PND, claudication, edema Respiratory: denies cough, dyspnea, DOE, pleurisy,  hoarseness, laryngitis, wheezing.  Gastrointestinal: Denies dysphagia, heartburn, reflux, water brash, pain, cramps, nausea, vomiting, bloating, diarrhea, constipation, hematemesis, melena, hematochezia, jaundice, hemorrhoids Genitourinary: Denies dysuria, frequency, urgency, nocturia, hesitancy, discharge, hematuria, flank pain Breast: Breast lumps, nipple discharge, bleeding.  Musculoskeletal: Denies arthralgia, myalgia, stiffness, Jt. Swelling, pain, limp, and strain/sprain. Denies falls. Skin: Denies puritis, rash, hives, warts, acne, eczema, changing in skin lesion Neuro: No weakness, tremor, incoordination, spasms, paresthesia, pain Psychiatric: Denies confusion, memory loss, sensory loss. Denies Depression. Endocrine: Denies change in weight, skin, hair change, nocturia, and paresthesia, diabetic polys, visual blurring, hyper/hypo glycemic episodes.  Heme/Lymph: No excessive bleeding, bruising, enlarged lymph nodes.  Physical Exam  BP 142/80 mmHg  Pulse 60  Temp(Src) 97.2 F (36.2 C)  Resp 16  Ht 5' 4.75" (1.645 m)  Wt 207 lb 3.2 oz (93.985 kg)  BMI 34.73 kg/m2  General Appearance: Well nourished and in no apparent distress. Eyes: PERRLA, EOMs, conjunctiva no swelling or erythema, normal fundi and vessels. Sinuses: No frontal/maxillary tenderness ENT/Mouth: EACs patent / TMs  nl. Nares clear without erythema, swelling, mucoid exudates. Oral hygiene is good. No erythema, swelling, or exudate. Tongue normal, non-obstructing. Tonsils not swollen or erythematous. Hearing normal.  Neck: Supple, thyroid normal. No bruits, nodes or JVD. Respiratory: Respiratory effort normal.  BS equal and clear bilateral without rales, rhonci, wheezing or stridor. Cardio: Heart sounds are normal with regular rate and rhythm and no murmurs, rubs or gallops. Peripheral pulses are normal and equal bilaterally with  Mild ankle edema asymmetric to the left. No aortic or femoral bruits. Chest: symmetric with  normal excursions and percussion. Breasts: Symmetric, without lumps, nipple discharge, retractions, or fibrocystic changes.  Abdomen: Flat, soft, with bowel sounds. Nontender, no guarding, rebound, hernias, masses, or organomegaly.  Lymphatics: Non tender without lymphadenopathy.  Musculoskeletal: Full ROM all peripheral extremities, joint stability, 5/5 strength, and normal gait. Skin: Warm and dry without rashes, lesions, cyanosis, clubbing or  ecchymosis.  Neuro: Cranial nerves intact, reflexes equal bilaterally. Normal muscle tone, no cerebellar symptoms. Sensation intact.  Pysch: Alert and oriented X 3, normal affect, Insight and Judgment appropriate.   Assessment  1. Encounter for general adult medical examination with abnormal findings  - Microalbumin / creatinine urine ratio - EKG 12-Lead - [place], RETROPERITNL ABD,  LTD - POC Hemoccult Bld/Stl - Vitamin B12 - Iron and TIBC - Urinalysis, Routine w reflex microscopic  - CBC with Differential/Platelet - BASIC METABOLIC PANEL WITH GFR - Hepatic function panel - Magnesium - Lipid panel - TSH - Hemoglobin A1c - Insulin, random - Vit D  25 hydroxy   2. Essential hypertension  - Microalbumin / creatinine urine ratio - EKG 12-Lead - [place], RETROPERITNL ABD,  LTD - TSH - patient does have an old Rx for HCTZ & is advised to try that prn for her ankle edema.   3. Hyperlipidemia  - Lipid panel  4. Prediabetes  - Hemoglobin A1c - Insulin, random  5. Vitamin D deficiency  - Vit D  25 hydroxy   6. BMI 36.19,  adult   7. Morbid obesity ([place])   8. Other fatigue  - Vitamin B12 - Iron and TIBC - TSH  9. Screening for rectal cancer  - POC Hemoccult Bld/Stl   10. Medication management  - Urinalysis, Routine w reflex microscopic  - CBC with Differential/Platelet - BASIC METABOLIC PANEL WITH GFR - Hepatic function panel - Magnesium   Continue prudent diet as discussed, weight control, BP monitoring, regular  exercise, and medications. Discussed med's effects and SE's. Screening labs and tests as requested with regular follow-up as recommended.  Over 40 minutes of exam, counseling, chart review was performed.

## 2015-05-25 NOTE — Patient Instructions (Signed)
Recommend Adult Low Dose Aspirin or   coated  Aspirin 81 mg daily   To reduce risk of Colon Cancer 20 %,   Skin Cancer 26 % ,   Melanoma 46%   and   Pancreatic cancer 60%   ++++++++++++++++++++++++++++++++++++++++++++++++++++++  Vitamin D goal   is between 70-100.   Please make sure that you are taking your Vitamin D as directed.   It is very important as a natural anti-inflammatory   helping hair, skin, and nails, as well as reducing stroke and heart attack risk.   It helps your bones and helps with mood.  It also decreases numerous cancer risks so please take it as directed.   Low Vit D is associated with a 200-300% higher risk for CANCER   and 200-300% higher risk for HEART   ATTACK  &  STROKE.   ......................................  It is also associated with higher death rate at younger ages,   autoimmune diseases like Rheumatoid arthritis, Lupus, Multiple Sclerosis.     Also many other serious conditions, like depression, Alzheimer's  Dementia, infertility, muscle aches, fatigue, fibromyalgia - just to name a few.  ++++++++++++++++++++++++++++++++++++++++++++++++  Recommend the book "The END of DIETING" by Dr Joel Fuhrman   & the book "The END of DIABETES " by Dr Joel Fuhrman  At [employer] - get book & Audio CD's     Being diabetic has a  300% increased risk for heart attack, stroke, cancer, and alzheimer- type vascular dementia. It is very important that you work harder with diet by avoiding all foods that are white. Avoid white rice (brown & wild rice is OK), white potatoes (sweetpotatoes in moderation is OK), White bread or wheat bread or anything made out of white flour like bagels, donuts, rolls, buns, biscuits, cakes, pastries, cookies, pizza crust, and pasta (made from white flour & egg whites) - vegetarian pasta or spinach or wheat pasta is OK. Multigrain breads like Arnold's or [employer], or multigrain sandwich thins or flatbreads.  Diet,  exercise and weight loss can reverse and cure diabetes in the early stages.  Diet, exercise and weight loss is very important in the control and prevention of complications of diabetes which affects every system in your body, ie. Brain - dementia/stroke, eyes - glaucoma/blindness, heart - heart attack/heart failure, kidneys - dialysis, stomach - gastric paralysis, intestines - malabsorption, nerves - severe painful neuritis, circulation - gangrene & loss of a leg(s), and finally cancer and Alzheimers.    I recommend avoid fried & greasy foods,  sweets/candy, white rice (brown or wild rice or Quinoa is OK), white potatoes (sweet potatoes are OK) - anything made from white flour - bagels, doughnuts, rolls, buns, biscuits,white and wheat breads, pizza crust and traditional pasta made of white flour & egg white(vegetarian pasta or spinach or wheat pasta is OK).  Multi-grain bread is OK - like multi-grain flat bread or sandwich thins. Avoid alcohol in excess. Exercise is also important.    Eat all the vegetables you want - avoid meat, especially red meat and dairy - especially cheese.  Cheese is the most concentrated form of trans-fats which is the worst thing to clog up our arteries. Veggie cheese is OK which can be found in the fresh produce section at [employer] or [employer] or [employer]  ++++++++++++++++++++++++++++++++++++++++++++++++++ DASH Eating Plan  DASH stands for "Dietary Approaches to Stop Hypertension."   The DASH eating plan is a healthy eating plan that has been shown to reduce high   blood pressure (hypertension). Additional health benefits may include reducing the risk of type 2 diabetes mellitus, heart disease, and stroke. The DASH eating plan may also help with weight loss.  WHAT DO I NEED TO KNOW ABOUT THE DASH EATING PLAN? For the DASH eating plan, you will follow these general guidelines:  Choose foods with a percent daily value for sodium of less than 5% (as listed on the food  label).  Use salt-free seasonings or herbs instead of table salt or sea salt.  Check with your health care provider or pharmacist before using salt substitutes.  Eat lower-sodium products, often labeled as "lower sodium" or "no salt added."  Eat fresh foods.  Eat more vegetables, fruits, and low-fat dairy products.    Choose whole grains. Look for the word "whole" as the first word in the ingredient list.  Choose fish   Limit sweets, desserts, sugars, and sugary drinks.  Choose heart-healthy fats.  Eat veggie cheese   Eat more home-cooked food and less restaurant, buffet, and fast food.  Limit fried foods.  Cook foods using methods other than frying.  Limit canned vegetables. If you do use them, rinse them well to decrease the sodium.  When eating at a restaurant, ask that your food be prepared with less salt, or no salt if possible.                      WHAT FOODS CAN I EAT?  Seek help from a dietitian for individual calorie needs. Grains Whole grain or whole wheat bread. Brown rice. Whole grain or whole wheat pasta. Quinoa, bulgur, and whole grain cereals. Low-sodium cereals. Corn or whole wheat flour tortillas. Whole grain cornbread. Whole grain crackers. Low-sodium crackers.  Vegetables Fresh or frozen vegetables (raw, steamed, roasted, or grilled). Low-sodium or reduced-sodium tomato and vegetable juices. Low-sodium or reduced-sodium tomato sauce and paste. Low-sodium or reduced-sodium canned vegetables.   Fruits All fresh, canned (in natural juice), or frozen fruits.  Meat and Other Protein Products  All fish and seafood.  Dried beans, peas, or lentils. Unsalted nuts and seeds. Unsalted canned beans. Dairy Low-fat dairy products, such as skim or 1% milk, 2% or reduced-fat cheeses, low-fat ricotta or cottage cheese, or plain low-fat yogurt. Low-sodium or reduced-sodium cheeses.  Fats and Oils Tub margarines without trans fats. Light or reduced-fat mayonnaise  and salad dressings (reduced sodium). Avocado. Safflower, olive, or canola oils. Natural peanut or almond butter.  Other Unsalted popcorn and pretzels. The items listed above may not be a complete list of recommended foods or beverages. Contact your dietitian for more options.  +++++++++++++++++++++++++++++++++++++++++++  WHAT FOODS ARE NOT RECOMMENDED?  Grains/ White flour or wheat flour  White bread. White pasta. White rice. Refined cornbread. Bagels and croissants. Crackers that contain trans fat.  Vegetables  Creamed or fried vegetables. Vegetables in a . Regular canned vegetables. Regular canned tomato sauce and paste. Regular tomato and vegetable juices.  Fruits Dried fruits. Canned fruit in light or heavy syrup. Fruit juice.  Meat and Other Protein Products Meat in general. Fatty cuts of meat. Ribs, chicken wings, bacon, sausage, bologna, salami, chitterlings, fatback, hot dogs, bratwurst, and packaged luncheon meats. Salted nuts and seeds. Canned beans with salt.  Dairy Whole or 2% milk, cream, half-and-half, and cream cheese. Whole-fat or sweetened yogurt. Full-fat cheeses or blue cheese. Nondairy creamers and whipped toppings. Processed cheese, cheese spreads, or cheese curds.  Condiments Onion and garlic salt, seasoned salt, table salt, and sea  salt. Canned and packaged gravies. Worcestershire sauce. Tartar sauce. Barbecue sauce. Teriyaki sauce. Soy sauce, including reduced sodium. Steak sauce. Fish sauce. Oyster sauce. Cocktail sauce. Horseradish. Ketchup and mustard. Meat flavorings and tenderizers. Bouillon cubes. Hot sauce. Tabasco sauce. Marinades. Taco seasonings. Relishes.  Fats and Oils Butter, stick margarine, lard, shortening, ghee, and bacon fat. Coconut, palm kernel, or palm oils. Regular salad dressings.  Pickles and olives. Salted popcorn and pretzels. The items listed above may not be a complete list of foods and beverages to avoid.   Preventive Care for  Adults  A healthy lifestyle and preventive care can promote health and wellness. Preventive health guidelines for women include the following key practices.  A routine yearly physical is a good way to check with your health care provider about your health and preventive screening. It is a chance to share any concerns and updates on your health and to receive a thorough exam.  Visit your dentist for a routine exam and preventive care every 6 months. Brush your teeth twice a day and floss once a day. Good oral hygiene prevents tooth decay and gum disease.  The frequency of eye exams is based on your age, health, family medical history, use of contact lenses, and other factors. Follow your health care provider's recommendations for frequency of eye exams.  Eat a healthy diet. Foods like vegetables, fruits, whole grains, low-fat dairy products, and lean protein foods contain the nutrients you need without too many calories. Decrease your intake of foods high in solid fats, added sugars, and salt. Eat the right amount of calories for you.Get information about a proper diet from your health care provider, if necessary.  Regular physical exercise is one of the most important things you can do for your health. Most adults should get at least 150 minutes of moderate-intensity exercise (any activity that increases your heart rate and causes you to sweat) each week. In addition, most adults need muscle-strengthening exercises on 2 or more days a week.  Maintain a healthy weight. The body mass index (BMI) is a screening tool to identify possible weight problems. It provides an estimate of body fat based on height and weight. Your health care provider can find your BMI and can help you achieve or maintain a healthy weight.For adults 20 years and older:  A BMI below 18.5 is considered underweight.  A BMI of 18.5 to 24.9 is normal.  A BMI of 25 to 29.9 is considered overweight.  A BMI of 30 and above is  considered obese.  Maintain normal blood lipids and cholesterol levels by exercising and minimizing your intake of saturated fat. Eat a balanced diet with plenty of fruit and vegetables. Blood tests for lipids and cholesterol should begin at age 54 and be repeated every 5 years. If your lipid or cholesterol levels are high, you are over 50, or you are at high risk for heart disease, you may need your cholesterol levels checked more frequently.Ongoing high lipid and cholesterol levels should be treated with medicines if diet and exercise are not working.  If you smoke, find out from your health care provider how to quit. If you do not use tobacco, do not start.  Lung cancer screening is recommended for adults aged 44-80 years who are at high risk for developing lung cancer because of a history of smoking. A yearly low-dose CT scan of the lungs is recommended for people who have at least a 30-pack-year history of smoking and are a current  smoker or have quit within the past 15 years. A pack year of smoking is smoking an average of 1 pack of cigarettes a day for 1 year (for example: 1 pack a day for 30 years or 2 packs a day for 15 years). Yearly screening should continue until the smoker has stopped smoking for at least 15 years. Yearly screening should be stopped for people who develop a health problem that would prevent them from having lung cancer treatment.  High blood pressure causes heart disease and increases the risk of stroke. Your blood pressure should be checked at least every 1 to 2 years. Ongoing high blood pressure should be treated with medicines if weight loss and exercise do not work.  If you are 35-42 years old, ask your health care provider if you should take aspirin to prevent strokes.  Diabetes screening involves taking a blood sample to check your fasting blood sugar level. This should be done once every 3 years, after age 74, if you are within normal weight and without risk factors  for diabetes. Testing should be considered at a younger age or be carried out more frequently if you are overweight and have at least 1 risk factor for diabetes.  Breast cancer screening is essential preventive care for women. You should practice "breast self-awareness." This means understanding the normal appearance and feel of your breasts and may include breast self-examination. Any changes detected, no matter how small, should be reported to a health care provider. Women in their 43s and 30s should have a clinical breast exam (CBE) by a health care provider as part of a regular health exam every 1 to 3 years. After age 23, women should have a CBE every year. Starting at age 7, women should consider having a mammogram (breast X-ray test) every year. Women who have a family history of breast cancer should talk to their health care provider about genetic screening. Women at a high risk of breast cancer should talk to their health care providers about having an MRI and a mammogram every year.  Breast cancer gene (BRCA)-related cancer risk assessment is recommended for women who have family members with BRCA-related cancers. BRCA-related cancers include breast, ovarian, tubal, and peritoneal cancers. Having family members with these cancers may be associated with an increased risk for harmful changes (mutations) in the breast cancer genes BRCA1 and BRCA2. Results of the assessment will determine the need for genetic counseling and BRCA1 and BRCA2 testing.  Routine pelvic exams to screen for cancer are no longer recommended for nonpregnant women who are considered low risk for cancer of the pelvic organs (ovaries, uterus, and vagina) and who do not have symptoms. Ask your health care provider if a screening pelvic exam is right for you.  If you have had past treatment for cervical cancer or a condition that could lead to cancer, you need Pap tests and screening for cancer for at least 20 years after your  treatment. If Pap tests have been discontinued, your risk factors (such as having a new sexual partner) need to be reassessed to determine if screening should be resumed. Some women have medical problems that increase the chance of getting cervical cancer. In these cases, your health care provider may recommend more frequent screening and Pap tests.  Colorectal cancer can be detected and often prevented. Most routine colorectal cancer screening begins at the age of 62 years and continues through age 67 years. However, your health care provider may recommend screening at an  earlier age if you have risk factors for colon cancer. On a yearly basis, your health care provider may provide home test kits to check for hidden blood in the stool. Use of a small camera at the end of a tube, to directly examine the colon (sigmoidoscopy or colonoscopy), can detect the earliest forms of colorectal cancer. Talk to your health care provider about this at age 38, when routine screening begins. Direct exam of the colon should be repeated every 5-10 years through age 80 years, unless early forms of pre-cancerous polyps or small growths are found.  Hepatitis C blood testing is recommended for all people born from [birth year] through 1965 and any individual with known risks for hepatitis C.  Pra  Osteoporosis is a disease in which the bones lose minerals and strength with aging. This can result in serious bone fractures or breaks. The risk of osteoporosis can be identified using a bone density scan. Women ages 44 years and over and women at risk for fractures or osteoporosis should discuss screening with their health care providers. Ask your health care provider whether you should take a calcium supplement or vitamin D to reduce the rate of osteoporosis.  Menopause can be associated with physical symptoms and risks. Hormone replacement therapy is available to decrease symptoms and risks. You should talk to your health care provider  about whether hormone replacement therapy is right for you.  Use sunscreen. Apply sunscreen liberally and repeatedly throughout the day. You should seek shade when your shadow is shorter than you. Protect yourself by wearing long sleeves, pants, a wide-brimmed hat, and sunglasses year round, whenever you are outdoors.  Once a month, do a whole body skin exam, using a mirror to look at the skin on your back. Tell your health care provider of new moles, moles that have irregular borders, moles that are larger than a pencil eraser, or moles that have changed in shape or color.  Stay current with required vaccines (immunizations).  Influenza vaccine. All adults should be immunized every year.  Tetanus, diphtheria, and acellular pertussis (Td, Tdap) vaccine. Pregnant women should receive 1 dose of Tdap vaccine during each pregnancy. The dose should be obtained regardless of the length of time since the last dose. Immunization is preferred during the 27th-36th week of gestation. An adult who has not previously received Tdap or who does not know her vaccine status should receive 1 dose of Tdap. This initial dose should be followed by tetanus and diphtheria toxoids (Td) booster doses every 10 years. Adults with an unknown or incomplete history of completing a 3-dose immunization series with Td-containing vaccines should begin or complete a primary immunization series including a Tdap dose. Adults should receive a Td booster every 10 years.  Varicella vaccine. An adult without evidence of immunity to varicella should receive 2 doses or a second dose if she has previously received 1 dose. Pregnant females who do not have evidence of immunity should receive the first dose after pregnancy. This first dose should be obtained before leaving the health care facility. The second dose should be obtained 4-8 weeks after the first dose.  Human papillomavirus (HPV) vaccine. Females aged 13-26 years who have not received  the vaccine previously should obtain the 3-dose series. The vaccine is not recommended for use in pregnant females. However, pregnancy testing is not needed before receiving a dose. If a female is found to be pregnant after receiving a dose, no treatment is needed. In that case, the remaining  doses should be delayed until after the pregnancy. Immunization is recommended for any person with an immunocompromised condition through the age of 61 years if she did not get any or all doses earlier. During the 3-dose series, the second dose should be obtained 4-8 weeks after the first dose. The third dose should be obtained 24 weeks after the first dose and 16 weeks after the second dose.  Zoster vaccine. One dose is recommended for adults aged 39 years or older unless certain conditions are present.  Measles, mumps, and rubella (MMR) vaccine. Adults born before [birth year] generally are considered immune to measles and mumps. Adults born in [birth year] or later should have 1 or more doses of MMR vaccine unless there is a contraindication to the vaccine or there is laboratory evidence of immunity to each of the three diseases. A routine second dose of MMR vaccine should be obtained at least 28 days after the first dose for students attending postsecondary schools, health care workers, or international travelers. People who received inactivated measles vaccine or an unknown type of measles vaccine during 1963-1967 should receive 2 doses of MMR vaccine. People who received inactivated mumps vaccine or an unknown type of mumps vaccine before 1979 and are at high risk for mumps infection should consider immunization with 2 doses of MMR vaccine. For females of childbearing age, rubella immunity should be determined. If there is no evidence of immunity, females who are not pregnant should be vaccinated. If there is no evidence of immunity, females who are pregnant should delay immunization until after pregnancy. Unvaccinated health care  workers born before [birth year] who lack laboratory evidence of measles, mumps, or rubella immunity or laboratory confirmation of disease should consider measles and mumps immunization with 2 doses of MMR vaccine or rubella immunization with 1 dose of MMR vaccine.  Pneumococcal 13-valent conjugate (PCV13) vaccine. When indicated, a person who is uncertain of her immunization history and has no record of immunization should receive the PCV13 vaccine. An adult aged 70 years or older who has certain medical conditions and has not been previously immunized should receive 1 dose of PCV13 vaccine. This PCV13 should be followed with a dose of pneumococcal polysaccharide (PPSV23) vaccine. The PPSV23 vaccine dose should be obtained at least 8 weeks after the dose of PCV13 vaccine. An adult aged 62 years or older who has certain medical conditions and previously received 1 or more doses of PPSV23 vaccine should receive 1 dose of PCV13. The PCV13 vaccine dose should be obtained 1 or more years after the last PPSV23 vaccine dose.    Pneumococcal polysaccharide (PPSV23) vaccine. When PCV13 is also indicated, PCV13 should be obtained first. All adults aged 60 years and older should be immunized. An adult younger than age 26 years who has certain medical conditions should be immunized. Any person who resides in a nursing home or long-term care facility should be immunized. An adult smoker should be immunized. People with an immunocompromised condition and certain other conditions should receive both PCV13 and PPSV23 vaccines. People with human immunodeficiency virus (HIV) infection should be immunized as soon as possible after diagnosis. Immunization during chemotherapy or radiation therapy should be avoided. Routine use of PPSV23 vaccine is not recommended for American Indians, [place] Natives, or people younger than 65 years unless there are medical conditions that require PPSV23 vaccine. When indicated, people who have unknown  immunization and have no record of immunization should receive PPSV23 vaccine. One-time revaccination 5 years after the first dose of PPSV23  is recommended for people aged 19-64 years who have chronic kidney failure, nephrotic syndrome, asplenia, or immunocompromised conditions. People who received 1-2 doses of PPSV23 before age 11 years should receive another dose of PPSV23 vaccine at age 42 years or later if at least 5 years have passed since the previous dose. Doses of PPSV23 are not needed for people immunized with PPSV23 at or after age 43 years.  Preventive Services / Frequency   Ages 63 to 62 years  Blood pressure check.  Lipid and cholesterol check.  Lung cancer screening. / Every year if you are aged 54-80 years and have a 30-pack-year history of smoking and currently smoke or have quit within the past 15 years. Yearly screening is stopped once you have quit smoking for at least 15 years or develop a health problem that would prevent you from having lung cancer treatment.  Clinical breast exam.** / Every year after age 34 years.  BRCA-related cancer risk assessment.** / For women who have family members with a BRCA-related cancer (breast, ovarian, tubal, or peritoneal cancers).  Mammogram.** / Every year beginning at age 49 years and continuing for as long as you are in good health. Consult with your health care provider.  Pap test.** / Every 3 years starting at age 54 years through age 108 or 44 years with a history of 3 consecutive normal Pap tests.  HPV screening.** / Every 3 years from ages 57 years through ages 77 to 59 years with a history of 3 consecutive normal Pap tests.  Fecal occult blood test (FOBT) of stool. / Every year beginning at age 101 years and continuing until age 52 years. You may not need to do this test if you get a colonoscopy every 10 years.  Flexible sigmoidoscopy or colonoscopy.** / Every 5 years for a flexible sigmoidoscopy or every 10 years for a  colonoscopy beginning at age 55 years and continuing until age 54 years.  Hepatitis C blood test.** / For all people born from [birth year] through 1967 and any individual with known risks for hepatitis C.  Skin self-exam. / Monthly.  Influenza vaccine. / Every year.  Tetanus, diphtheria, and acellular pertussis (Tdap/Td) vaccine.** / Consult your health care provider. Pregnant women should receive 1 dose of Tdap vaccine during each pregnancy. 1 dose of Td every 10 years.  Varicella vaccine.** / Consult your health care provider. Pregnant females who do not have evidence of immunity should receive the first dose after pregnancy.  Zoster vaccine.** / 1 dose for adults aged 27 years or older.  Pneumococcal 13-valent conjugate (PCV13) vaccine.** / Consult your health care provider.  Pneumococcal polysaccharide (PPSV23) vaccine.** / 1 to 2 doses if you smoke cigarettes or if you have certain conditions.  Meningococcal vaccine.** / Consult your health care provider.  Hepatitis A vaccine.** / Consult your health care provider.  Hepatitis B vaccine.** / Consult your health care provider. Screening for abdominal aortic aneurysm (AAA)  by ultrasound is recommended for people over 50 who have history of high blood pressure or who are current or former smokers.

## 2015-05-26 LAB — URINALYSIS, ROUTINE W REFLEX MICROSCOPIC
Bilirubin Urine: NEGATIVE
Glucose, UA: NEGATIVE
HGB URINE DIPSTICK: NEGATIVE
Ketones, ur: NEGATIVE
LEUKOCYTES UA: NEGATIVE
NITRITE: NEGATIVE
PH: 6 (ref 5.0–8.0)
Protein, ur: NEGATIVE
SPECIFIC GRAVITY, URINE: 1.017 (ref 1.001–1.035)

## 2015-05-26 LAB — MICROALBUMIN / CREATININE URINE RATIO
Creatinine, Urine: 97 mg/dL (ref 20–320)
MICROALB UR: 0.5 mg/dL
MICROALB/CREAT RATIO: 5 ug/mg{creat} (ref <30)

## 2015-05-26 LAB — VITAMIN D 25 HYDROXY (VIT D DEFICIENCY, FRACTURES): Vit D, 25-Hydroxy: 59 ng/mL (ref 30–100)

## 2015-05-26 LAB — HEMOGLOBIN A1C
Hgb A1c MFr Bld: 5.4 % (ref <5.7)
Mean Plasma Glucose: 108 mg/dL (ref <117)

## 2015-05-26 LAB — INSULIN, RANDOM: Insulin: 21.5 u[IU]/mL — ABNORMAL HIGH (ref 2.0–19.6)

## 2015-05-27 LAB — TB SKIN TEST
INDURATION: 0 mm
TB SKIN TEST: NEGATIVE

## 2015-06-29 ENCOUNTER — Other Ambulatory Visit: Payer: Self-pay | Admitting: Internal Medicine

## 2015-08-30 ENCOUNTER — Other Ambulatory Visit: Payer: Self-pay | Admitting: Internal Medicine

## 2015-09-07 ENCOUNTER — Ambulatory Visit (INDEPENDENT_AMBULATORY_CARE_PROVIDER_SITE_OTHER): Payer: BLUE CROSS/BLUE SHIELD | Admitting: Internal Medicine

## 2015-09-07 ENCOUNTER — Encounter: Payer: Self-pay | Admitting: Internal Medicine

## 2015-09-07 VITALS — BP 138/74 | HR 72 | Temp 98.2°F | Resp 16 | Ht 64.75 in | Wt 206.0 lb

## 2015-09-07 DIAGNOSIS — I10 Essential (primary) hypertension: Secondary | ICD-10-CM | POA: Diagnosis not present

## 2015-09-07 DIAGNOSIS — R7303 Prediabetes: Secondary | ICD-10-CM

## 2015-09-07 DIAGNOSIS — J45909 Unspecified asthma, uncomplicated: Secondary | ICD-10-CM

## 2015-09-07 DIAGNOSIS — E785 Hyperlipidemia, unspecified: Secondary | ICD-10-CM | POA: Diagnosis not present

## 2015-09-07 DIAGNOSIS — J309 Allergic rhinitis, unspecified: Secondary | ICD-10-CM

## 2015-09-07 DIAGNOSIS — Z79899 Other long term (current) drug therapy: Secondary | ICD-10-CM

## 2015-09-07 DIAGNOSIS — E559 Vitamin D deficiency, unspecified: Secondary | ICD-10-CM | POA: Diagnosis not present

## 2015-09-07 LAB — BASIC METABOLIC PANEL WITH GFR
BUN: 16 mg/dL (ref 7–25)
CO2: 30 mmol/L (ref 20–31)
Calcium: 10.3 mg/dL (ref 8.6–10.4)
Chloride: 102 mmol/L (ref 98–110)
Creat: 0.86 mg/dL (ref 0.50–1.05)
GFR, EST AFRICAN AMERICAN: 87 mL/min (ref >=60)
GFR, EST NON AFRICAN AMERICAN: 76 mL/min (ref >=60)
Glucose, Bld: 89 mg/dL (ref 65–99)
POTASSIUM: 4.2 mmol/L (ref 3.5–5.3)
Sodium: 142 mmol/L (ref 135–146)

## 2015-09-07 LAB — LIPID PANEL
CHOL/HDL RATIO: 3.2 ratio (ref <=5.0)
Cholesterol: 136 mg/dL (ref 125–200)
HDL: 42 mg/dL — AB (ref >=46)
LDL Cholesterol: 59 mg/dL (ref <130)
Triglycerides: 176 mg/dL — ABNORMAL HIGH (ref <150)
VLDL: 35 mg/dL — ABNORMAL HIGH (ref <30)

## 2015-09-07 LAB — HEPATIC FUNCTION PANEL
ALBUMIN: 4.4 g/dL (ref 3.6–5.1)
ALK PHOS: 73 U/L (ref 33–130)
ALT: 22 U/L (ref 6–29)
AST: 20 U/L (ref 10–35)
BILIRUBIN TOTAL: 0.5 mg/dL (ref 0.2–1.2)
Bilirubin, Direct: 0.1 mg/dL (ref <=0.2)
Indirect Bilirubin: 0.4 mg/dL (ref 0.2–1.2)
Total Protein: 6.4 g/dL (ref 6.1–8.1)

## 2015-09-07 LAB — CBC WITH DIFFERENTIAL/PLATELET
BASOS ABS: 0.1 10*3/uL (ref 0.0–0.1)
Basophils Relative: 1 % (ref 0–1)
EOS ABS: 0.2 10*3/uL (ref 0.0–0.7)
EOS PCT: 2 % (ref 0–5)
HCT: 40.6 % (ref 36.0–46.0)
Hemoglobin: 13.6 g/dL (ref 12.0–15.0)
Lymphocytes Relative: 26 % (ref 12–46)
Lymphs Abs: 2 10*3/uL (ref 0.7–4.0)
MCH: 30.2 pg (ref 26.0–34.0)
MCHC: 33.5 g/dL (ref 30.0–36.0)
MCV: 90.2 fL (ref 78.0–100.0)
MPV: 10.6 fL (ref 8.6–12.4)
Monocytes Absolute: 0.4 10*3/uL (ref 0.1–1.0)
Monocytes Relative: 5 % (ref 3–12)
Neutro Abs: 5 10*3/uL (ref 1.7–7.7)
Neutrophils Relative %: 66 % (ref 43–77)
PLATELETS: 242 10*3/uL (ref 150–400)
RBC: 4.5 MIL/uL (ref 3.87–5.11)
RDW: 13.4 % (ref 11.5–15.5)
WBC: 7.5 10*3/uL (ref 4.0–10.5)

## 2015-09-07 LAB — HEMOGLOBIN A1C
HEMOGLOBIN A1C: 5.6 % (ref <5.7)
Mean Plasma Glucose: 114 mg/dL (ref <117)

## 2015-09-07 LAB — TSH: TSH: 2.777 u[IU]/mL (ref 0.350–4.500)

## 2015-09-07 MED ORDER — DEXAMETHASONE SODIUM PHOSPHATE 100 MG/10ML IJ SOLN
10.0000 mg | Freq: Once | INTRAMUSCULAR | Status: AC
  Administered 2015-09-07: 10 mg via INTRAMUSCULAR

## 2015-09-07 MED ORDER — BENZONATATE 200 MG PO CAPS: 200.0000 mg | ORAL_CAPSULE | Freq: Three times a day (TID) | ORAL | Status: DC | PRN

## 2015-09-07 MED ORDER — AZELASTINE HCL 0.1 % NA SOLN: 1.0000 | Freq: Two times a day (BID) | NASAL | Status: DC

## 2015-09-07 NOTE — Progress Notes (Signed)
Patient ID: Rhonda [name], female   DOB: 1960-12-16, 55 y.o.   MRN: [id number]  Assessment and Plan:  Hypertension:  -Continue medication,  -monitor blood pressure at home.  -Continue DASH diet.   -Reminder to go to the ER if any CP, SOB, nausea, dizziness, severe HA, changes vision/speech, left arm numbness and tingling, and jaw pain.  Cholesterol: -Continue diet and exercise.  -Check cholesterol.   Pre-diabetes: -Continue diet and exercise.  -Check A1C  Vitamin D Def: -check level -continue medications.   Allergic rhinitis -astelin -tessalon -decadron -f/u with allergist  Continue diet and meds as discussed. Further disposition pending results of labs.  HPI 55 y.o. female  presents for 3 month follow up with hypertension, hyperlipidemia, prediabetes and vitamin D.   Her blood pressure has been controlled at home, today their BP is BP: 138/74 mmHg.   She does workout. She denies chest pain, shortness of breath, dizziness.   She is on cholesterol medication and denies myalgias. Her cholesterol is at goal. The cholesterol last visit was:   Lab Results  Component Value Date   CHOL 169 05/25/2015   HDL 40* 05/25/2015   LDLCALC 82 05/25/2015   TRIG 234* 05/25/2015   CHOLHDL 4.2 05/25/2015     She has been working on diet and exercise for prediabetes, and denies foot ulcerations, hyperglycemia, hypoglycemia , increased appetite, nausea, paresthesia of the feet, polydipsia, polyuria, visual disturbances, vomiting and weight loss. Last A1C in the office was:  Lab Results  Component Value Date   HGBA1C 5.4 05/25/2015    Patient is on Vitamin D supplement.  Lab Results  Component Value Date   VD25OH 8 05/25/2015     She reports that she has been having allergies and asthma difficulty for the past couple months.  She has had a lot of drainage and it makes her cough at night time.  She would like some tessalon to help with the coughing.  She does have some mild yellow  sputum coming up when she is coughing.  She only uses albuterol once monthly.       Current Medications:  Current Outpatient Prescriptions on File Prior to Visit  Medication Sig Dispense Refill  . albuterol (PROVENTIL HFA;VENTOLIN HFA) 108 (90 BASE) MCG/ACT inhaler Inhale into the lungs every 6 (six) hours as needed for wheezing or shortness of breath.    . bisoprolol-hydrochlorothiazide (ZIAC) 5-6.25 MG tablet TAKE 1/2-1 TABLET BY MOUTH EVERY DAY FOR BLOOD PRESSURE 90 tablet 0  . budesonide-formoterol (SYMBICORT) 160-4.5 MCG/ACT inhaler Inhale 2 puffs into the lungs 2 (two) times daily.    . cholecalciferol (VITAMIN D) 1000 UNITS tablet Take 5,000 Units by mouth daily.    Marland Kitchen glucosamine-chondroitin 500-400 MG tablet Take 1 tablet by mouth daily.    Marland Kitchen losartan (COZAAR) 100 MG tablet TAKE ONE TABLET BY MOUTH EVERY DAY 90 tablet 2  . montelukast (SINGULAIR) 10 MG tablet Take 10 mg by mouth at bedtime.    . Multiple Vitamins-Minerals (MULTIVITAMIN PO) Take by mouth daily.    Marland Kitchen OVER THE COUNTER MEDICATION Bone strenght vitamin with Calcium and Magnesium    . pravastatin (PRAVACHOL) 40 MG tablet TAKE ONE TABLET BY MOUTH AT BEDTIME FOR CHOLESTEROL 90 tablet 0   No current facility-administered medications on file prior to visit.    Medical History:  Past Medical History  Diagnosis Date  . Hypertension   . Hyperlipidemia   . Allergy   . Asthma   . Vitamin D deficiency   .  Prediabetes   . Obesity   . Migraine     Allergies:  Allergies  Allergen Reactions  . Ace Inhibitors     cough  . Azithromycin     rash  . Prednisone     High dose  . Vicodin [Hydrocodone-Acetaminophen]     Nausea/vomiting     Review of Systems:  Review of Systems  Constitutional: Negative for fever and chills.  HENT: Negative for congestion, ear pain and sore throat.   Respiratory: Positive for cough. Negative for sputum production, shortness of breath and wheezing.   Cardiovascular: Negative for chest  pain, palpitations and leg swelling.  Gastrointestinal: Positive for constipation. Negative for heartburn, diarrhea, blood in stool and melena.  Genitourinary: Negative.   Skin: Negative.   Neurological: Negative for dizziness, sensory change, loss of consciousness and headaches.  Psychiatric/Behavioral: Negative for depression. The patient is not nervous/anxious and does not have insomnia.     Family history- Review and unchanged  Social history- Review and unchanged  Physical Exam: BP 138/74 mmHg  Pulse 72  Temp(Src) 98.2 F (36.8 C) (Temporal)  Resp 16  Ht 5' 4.75" (1.645 m)  Wt 206 lb (93.441 kg)  BMI 34.53 kg/m2 Wt Readings from Last 3 Encounters:  09/07/15 206 lb (93.441 kg)  05/25/15 207 lb 3.2 oz (93.985 kg)  02/12/15 205 lb (92.987 kg)    General Appearance: Well nourished well developed, in no apparent distress. Eyes: PERRLA, EOMs, conjunctiva no swelling or erythema ENT/Mouth: Ear canals normal without obstruction, swelling, erythma, discharge.  TMs normal bilaterally.  Oropharynx moist, clear, without exudate, or postoropharyngeal swelling. Neck: Supple, thyroid normal,no cervical adenopathy  Respiratory: Respiratory effort normal, Breath sounds clear A&P without rhonchi, wheeze, or rale.  No retractions, no accessory usage. Cardio: RRR with no MRGs. Brisk peripheral pulses without edema.  Abdomen: Soft, + BS,  Non tender, no guarding, rebound, hernias, masses. Musculoskeletal: Full ROM, 5/5 strength, Normal gait Skin: Warm, dry without rashes, lesions, ecchymosis.  Neuro: Awake and oriented X 3, Cranial nerves intact. Normal muscle tone, no cerebellar symptoms. Psych: Normal affect, Insight and Judgment appropriate.    Starlyn Skeans, PA-C 9:03 AM [facility]

## 2015-09-07 NOTE — Patient Instructions (Signed)
Put your head down and look at your feet while using any nasal spray.  Spray up to the crown of your head or towards your ears.  Use Ayr saline gel or vaseline on the interior of your nose for some moisturization   Please take tessalon as needed for cough.  Call office if fevers, chills, or trouble with breathing.

## 2015-12-07 ENCOUNTER — Encounter: Payer: Self-pay | Admitting: Internal Medicine

## 2015-12-07 ENCOUNTER — Ambulatory Visit (INDEPENDENT_AMBULATORY_CARE_PROVIDER_SITE_OTHER): Payer: BLUE CROSS/BLUE SHIELD | Admitting: Internal Medicine

## 2015-12-07 VITALS — BP 138/88 | HR 64 | Temp 97.4°F | Resp 16 | Ht 64.75 in | Wt 204.6 lb

## 2015-12-07 DIAGNOSIS — E559 Vitamin D deficiency, unspecified: Secondary | ICD-10-CM | POA: Diagnosis not present

## 2015-12-07 DIAGNOSIS — E785 Hyperlipidemia, unspecified: Secondary | ICD-10-CM | POA: Diagnosis not present

## 2015-12-07 DIAGNOSIS — Z79899 Other long term (current) drug therapy: Secondary | ICD-10-CM | POA: Diagnosis not present

## 2015-12-07 DIAGNOSIS — I10 Essential (primary) hypertension: Secondary | ICD-10-CM | POA: Diagnosis not present

## 2015-12-07 DIAGNOSIS — R7303 Prediabetes: Secondary | ICD-10-CM | POA: Diagnosis not present

## 2015-12-07 LAB — BASIC METABOLIC PANEL WITH GFR
BUN: 16 mg/dL (ref 7–25)
CALCIUM: 10.1 mg/dL (ref 8.6–10.4)
CO2: 27 mmol/L (ref 20–31)
CREATININE: 0.87 mg/dL (ref 0.50–1.05)
Chloride: 105 mmol/L (ref 98–110)
GFR, EST AFRICAN AMERICAN: 86 mL/min (ref >=60)
GFR, Est Non African American: 75 mL/min (ref >=60)
GLUCOSE: 88 mg/dL (ref 65–99)
POTASSIUM: 4.3 mmol/L (ref 3.5–5.3)
Sodium: 142 mmol/L (ref 135–146)

## 2015-12-07 LAB — CBC WITH DIFFERENTIAL/PLATELET
BASOS PCT: 1 %
Basophils Absolute: 58 cells/uL (ref 0–200)
EOS ABS: 174 {cells}/uL (ref 15–500)
Eosinophils Relative: 3 %
HEMATOCRIT: 40.7 % (ref 35.0–45.0)
Hemoglobin: 13.8 g/dL (ref 11.7–15.5)
LYMPHS PCT: 28 %
Lymphs Abs: 1624 cells/uL (ref 850–3900)
MCH: 31.2 pg (ref 27.0–33.0)
MCHC: 33.9 g/dL (ref 32.0–36.0)
MCV: 91.9 fL (ref 80.0–100.0)
MONO ABS: 406 {cells}/uL (ref 200–950)
MONOS PCT: 7 %
MPV: 10.2 fL (ref 7.5–12.5)
NEUTROS PCT: 61 %
Neutro Abs: 3538 cells/uL (ref 1500–7800)
PLATELETS: 211 10*3/uL (ref 140–400)
RBC: 4.43 MIL/uL (ref 3.80–5.10)
RDW: 13.5 % (ref 11.0–15.0)
WBC: 5.8 10*3/uL (ref 3.8–10.8)

## 2015-12-07 LAB — LIPID PANEL
CHOLESTEROL: 162 mg/dL (ref 125–200)
HDL: 39 mg/dL — AB (ref >=46)
LDL CALC: 83 mg/dL (ref <130)
TRIGLYCERIDES: 202 mg/dL — AB (ref <150)
Total CHOL/HDL Ratio: 4.2 Ratio (ref <=5.0)
VLDL: 40 mg/dL — ABNORMAL HIGH (ref <30)

## 2015-12-07 LAB — HEPATIC FUNCTION PANEL
ALBUMIN: 4.6 g/dL (ref 3.6–5.1)
ALK PHOS: 78 U/L (ref 33–130)
ALT: 22 U/L (ref 6–29)
AST: 22 U/L (ref 10–35)
BILIRUBIN TOTAL: 0.5 mg/dL (ref 0.2–1.2)
Bilirubin, Direct: 0.1 mg/dL (ref <=0.2)
Indirect Bilirubin: 0.4 mg/dL (ref 0.2–1.2)
TOTAL PROTEIN: 6.3 g/dL (ref 6.1–8.1)

## 2015-12-07 LAB — HEMOGLOBIN A1C
Hgb A1c MFr Bld: 5.3 % (ref <5.7)
MEAN PLASMA GLUCOSE: 105 mg/dL

## 2015-12-07 LAB — TSH: TSH: 2.7 mIU/L

## 2015-12-07 LAB — MAGNESIUM: MAGNESIUM: 2.2 mg/dL (ref 1.5–2.5)

## 2015-12-07 NOTE — Patient Instructions (Signed)

## 2015-12-07 NOTE — Progress Notes (Signed)
Patient ID: Rhonda [name], female   DOB: 1960/09/04, 55 y.o.   MRN: [id number] [facility]                    Unk Pinto, M.D.    Uvaldo Bristle. Silverio Lay, P.A.-C      Starlyn Skeans, P.A.-C   [facility]                [address] [place], [place] [id number] Telephone [phone] Telefax [phone] _________________________________     This very nice 55 y.o. DWF presents for 6 month follow up with Hypertension, Hyperlipidemia, Pre-Diabetes and Vitamin D Deficiency.      Patient is treated for HTN circa 2007 & BP has been controlled at home. Today's BP: 138/88 mmHg. Patient has had no complaints of any cardiac type chest pain, palpitations, dyspnea/orthopnea/PND, dizziness, claudication, or dependent edema.     Hyperlipidemia is controlled with diet & meds. Patient denies myalgias or other med SE's. Last Lipids were at goal with Cholesterol 136; HDL 42*; LDL 59;  & sl elevated Triglycerides 176 on 09/07/2015.     Also, the patient has history of Morbid Obesity (BMI 34+) and consequent PreDiabetes since 2012 with A1c 5.8% and elevated insulin 37.  She denies symptoms of reactive hypoglycemia, diabetic polys, paresthesias or visual blurring.  Last A1c was at goal with A1c  5.6% on 09/07/2015.     Further, the patient also has history of Vitamin D Deficiency  of "25" in 2008 and supplements vitamin D without any suspected side-effects. Last vitamin D was 59 on 05/25/2015.  Medication Sig  . albuterol  HFA inhaler Inhale into the lungs every 6 (six) hours as needed for wheezing or shortness of breath.  Marland Kitchen aspirin 81 MG  Take 81 mg by mouth daily.  . ASTELIN nasal spray Place 1 spray into both nostrils 2 (two) times daily. Use in each nostril as directed  . bisoprolol-hctz  5-6.25  TAKE 1/2-1 TABLET BY MOUTH EVERY DAY FOR BLOOD PRESSURE  . SYMBICORT inhaler Inhale 2 puffs into the lungs 2 (two) times daily.  Marland Kitchen  co-Q-10   30 MG  Take 30 mg by mouth 3 (three) times daily.  Marland Kitchen glucosamine-chondroitin  Take 1 tablet by mouth daily.  Marland Kitchen losartan 100 MG tablet TAKE ONE TABLET BY MOUTH EVERY DAY  . Montelukast 10 MG tablet Take 10 mg by mouth at bedtime.  . Multiple Vitamins-Minerals  Take by mouth daily.  . OTC MED Bone strenght vitamin with Calcium and Magnesium  . pravastatin  40 MG tablet TAKE ONE TABLET BY MOUTH AT BEDTIME FOR CHOLESTEROL  . VITAMIN D 1000 UNITS  Take 5,000 Units by mouth daily.  Marland Kitchen loratadine10 MG tablet Take 10 mg by mouth daily as needed for allergies.   Allergies  Allergen Reactions  . Ace Inhibitors     cough  . Azithromycin     rash  . Prednisone     High dose  . Vicodin [Hydrocodone-Acetaminophen]     Nausea/vomiting   PMHx:   Past Medical History  Diagnosis Date  . Hypertension   . Hyperlipidemia   . Allergy   . Asthma   . Vitamin D deficiency   . Prediabetes   . Obesity   . Migraine    Immunization History  Administered Date(s) Administered  . PPD Test  05/12/2014, 05/25/2015  . Pneumococcal-Unspecified 08/29/2009  . Tdap 08/30/2007   Past Surgical History  Procedure Laterality Date  . Dilation and curettage, diagnostic / therapeutic  2009  . Nevus excision  2012    dysplastic from back  . Lymph node biopsy Left 1991    negative   FHx:    Reviewed / unchanged  SHx:    Reviewed / unchanged  Systems Review:  Constitutional: Denies fever, chills, wt changes, headaches, insomnia, fatigue, night sweats, change in appetite. Eyes: Denies redness, blurred vision, diplopia, discharge, itchy, watery eyes.  ENT: Denies discharge, congestion, post nasal drip, epistaxis, sore throat, earache, hearing loss, dental pain, tinnitus, vertigo, sinus pain, snoring.  CV: Denies chest pain, palpitations, irregular heartbeat, syncope, dyspnea, diaphoresis, orthopnea, PND, claudication or edema. Respiratory: denies cough, dyspnea, DOE, pleurisy, hoarseness, laryngitis,  wheezing.  Gastrointestinal: Denies dysphagia, odynophagia, heartburn, reflux, water brash, abdominal pain or cramps, nausea, vomiting, bloating, diarrhea, constipation, hematemesis, melena, hematochezia  or hemorrhoids. Genitourinary: Denies dysuria, frequency, urgency, nocturia, hesitancy, discharge, hematuria or flank pain. Musculoskeletal: Denies arthralgias, myalgias, stiffness, jt. swelling, pain, limping or strain/sprain.  Skin: Denies pruritus, rash, hives, warts, acne, eczema or change in skin lesion(s). Neuro: No weakness, tremor, incoordination, spasms, paresthesia or pain. Psychiatric: Denies confusion, memory loss or sensory loss. Endo: Denies change in weight, skin or hair change.  Heme/Lymph: No excessive bleeding, bruising or enlarged lymph nodes.  Physical Exam  BP 138/88 mmHg  Pulse 64  Temp(Src) 97.4 F (36.3 C)  Resp 16  Ht 5' 4.75" (1.645 m)  Wt 204 lb 9.6 oz (92.806 kg)  BMI 34.30 kg/m2  Appears well nourished and in no distress. Eyes: PERRLA, EOMs, conjunctiva no swelling or erythema. Sinuses: No frontal/maxillary tenderness ENT/Mouth: EAC's clear, TM's nl w/o erythema, bulging. Nares clear w/o erythema, swelling, exudates. Oropharynx clear without erythema or exudates. Oral hygiene is good. Tongue normal, non obstructing. Hearing intact.  Neck: Supple. Thyroid nl. Car 2+/2+ without bruits, nodes or JVD. Chest: Respirations nl with BS clear & equal w/o rales, rhonchi, wheezing or stridor.  Cor: Heart sounds normal w/ regular rate and rhythm without sig. murmurs, gallops, clicks, or rubs. Peripheral pulses normal and equal  without edema.  Abdomen: Soft & bowel sounds normal. Non-tender w/o guarding, rebound, hernias, masses, or organomegaly.  Lymphatics: Unremarkable.  Musculoskeletal: Full ROM all peripheral extremities, joint stability, 5/5 strength, and normal gait.  Skin: Warm, dry without exposed rashes, lesions or ecchymosis apparent.  Neuro: Cranial  nerves intact, reflexes equal bilaterally. Sensory-motor testing grossly intact. Tendon reflexes grossly intact.  Pysch: Alert & oriented x 3.  Insight and judgement nl & appropriate. No ideations.  Assessment and Plan:  1. Essential hypertension  - TSH  2. Hyperlipidemia  - Lipid panel - TSH  3. Prediabetes  - Hemoglobin A1c - Insulin, random  4. Vitamin D deficiency  - VITAMIN D 25 Hydroxy   5. Medication management  - CBC with Differential/Platelet - BASIC METABOLIC PANEL WITH GFR - Hepatic function panel - Magnesium   Recommended regular exercise, BP monitoring, weight control, and discussed med and SE's. Recommended labs to assess and monitor clinical status. Further disposition pending results of labs. Over 30 minutes of exam, counseling, chart review was performed

## 2015-12-08 LAB — VITAMIN D 25 HYDROXY (VIT D DEFICIENCY, FRACTURES): VIT D 25 HYDROXY: 73 ng/mL (ref 30–100)

## 2015-12-08 LAB — INSULIN, RANDOM: INSULIN: 26.4 u[IU]/mL — AB (ref 2.0–19.6)

## 2016-02-23 ENCOUNTER — Other Ambulatory Visit: Payer: Self-pay | Admitting: Obstetrics and Gynecology

## 2016-02-23 ENCOUNTER — Other Ambulatory Visit: Payer: Self-pay | Admitting: *Deleted

## 2016-02-23 DIAGNOSIS — Z1231 Encounter for screening mammogram for malignant neoplasm of breast: Secondary | ICD-10-CM

## 2016-02-23 MED ORDER — PRAVASTATIN SODIUM 40 MG PO TABS: ORAL_TABLET | ORAL | Status: DC

## 2016-03-07 ENCOUNTER — Other Ambulatory Visit: Payer: Self-pay | Admitting: Internal Medicine

## 2016-03-08 ENCOUNTER — Ambulatory Visit (INDEPENDENT_AMBULATORY_CARE_PROVIDER_SITE_OTHER): Payer: BLUE CROSS/BLUE SHIELD | Admitting: Physician Assistant

## 2016-03-08 ENCOUNTER — Encounter: Payer: Self-pay | Admitting: Physician Assistant

## 2016-03-08 VITALS — BP 126/82 | HR 78 | Temp 97.7°F | Resp 14 | Ht 64.75 in | Wt 205.8 lb

## 2016-03-08 DIAGNOSIS — R1030 Lower abdominal pain, unspecified: Secondary | ICD-10-CM | POA: Diagnosis not present

## 2016-03-08 DIAGNOSIS — E559 Vitamin D deficiency, unspecified: Secondary | ICD-10-CM | POA: Diagnosis not present

## 2016-03-08 DIAGNOSIS — Z79899 Other long term (current) drug therapy: Secondary | ICD-10-CM | POA: Diagnosis not present

## 2016-03-08 DIAGNOSIS — I10 Essential (primary) hypertension: Secondary | ICD-10-CM | POA: Diagnosis not present

## 2016-03-08 DIAGNOSIS — E785 Hyperlipidemia, unspecified: Secondary | ICD-10-CM | POA: Diagnosis not present

## 2016-03-08 DIAGNOSIS — R7303 Prediabetes: Secondary | ICD-10-CM | POA: Diagnosis not present

## 2016-03-08 LAB — CBC WITH DIFFERENTIAL/PLATELET
BASOS PCT: 1 %
Basophils Absolute: 76 cells/uL (ref 0–200)
EOS ABS: 152 {cells}/uL (ref 15–500)
Eosinophils Relative: 2 %
HEMATOCRIT: 39.3 % (ref 35.0–45.0)
Hemoglobin: 13.1 g/dL (ref 11.7–15.5)
Lymphocytes Relative: 26 %
Lymphs Abs: 1976 cells/uL (ref 850–3900)
MCH: 30.3 pg (ref 27.0–33.0)
MCHC: 33.3 g/dL (ref 32.0–36.0)
MCV: 90.8 fL (ref 80.0–100.0)
MONO ABS: 532 {cells}/uL (ref 200–950)
MPV: 10.5 fL (ref 7.5–12.5)
Monocytes Relative: 7 %
NEUTROS ABS: 4864 {cells}/uL (ref 1500–7800)
Neutrophils Relative %: 64 %
PLATELETS: 243 10*3/uL (ref 140–400)
RBC: 4.33 MIL/uL (ref 3.80–5.10)
RDW: 13.5 % (ref 11.0–15.0)
WBC: 7.6 10*3/uL (ref 3.8–10.8)

## 2016-03-08 NOTE — Patient Instructions (Signed)
Add probiotic/yogurt Add green veggies for fiber  Abdominal Pain, Adult Many things can cause abdominal pain. Usually, abdominal pain is not caused by a disease and will improve without treatment. It can often be observed and treated at home. Your health care provider will do a physical exam and possibly order blood tests and X-rays to help determine the seriousness of your pain. However, in many cases, more time must pass before a clear cause of the pain can be found. Before that point, your health care provider may not know if you need more testing or further treatment. HOME CARE INSTRUCTIONS Monitor your abdominal pain for any changes. The following actions may help to alleviate any discomfort you are experiencing:  Only take over-the-counter or prescription medicines as directed by your health care provider.  Do not take laxatives unless directed to do so by your health care provider.  Try a clear liquid diet (broth, tea, or water) as directed by your health care provider. Slowly move to a bland diet as tolerated. SEEK MEDICAL CARE IF:  You have unexplained abdominal pain.  You have abdominal pain associated with nausea or diarrhea.  You have pain when you urinate or have a bowel movement.  You experience abdominal pain that wakes you in the night.  You have abdominal pain that is worsened or improved by eating food.  You have abdominal pain that is worsened with eating fatty foods.  You have a fever. SEEK IMMEDIATE MEDICAL CARE IF:  Your pain does not go away within 2 hours.  You keep throwing up (vomiting).  Your pain is felt only in portions of the abdomen, such as the right side or the left lower portion of the abdomen.  You pass bloody or black tarry stools. MAKE SURE YOU:  Understand these instructions.  Will watch your condition.  Will get help right away if you are not doing well or get worse.   This information is not intended to replace advice given to you  by your health care provider. Make sure you discuss any questions you have with your health care provider.   Document Released: 05/03/2005 Document Revised: 04/14/2015 Document Reviewed: 04/02/2013 Elsevier Interactive Patient Education [employer].

## 2016-03-08 NOTE — Progress Notes (Signed)
Patient ID: LYNDELL WENCK, female   DOB: September 18, 1960, 55 y.o.   MRN: [id number]  Assessment and Plan:  Hypertension:  -Continue medication,  -monitor blood pressure at home.  -Continue DASH diet.   -Reminder to go to the ER if any CP, SOB, nausea, dizziness, severe HA, changes vision/speech, left arm numbness and tingling, and jaw pain.  Cholesterol: -Continue diet and exercise.  -Check cholesterol.   Pre-diabetes: -Continue diet and exercise.  -Check A1C  Vitamin D Def: -check level -continue medications.   Morbid Obesity with co morbidities - long discussion about weight loss, diet, and exercise  Abdominal discomfort- nontender AB, benign exam ? Constipation, will send for colonoscopy, try to get near [place] where she lives, increase fiber, increase water If negative needs AB/pelvic US   Continue diet and meds as discussed. Further disposition pending results of labs.  HPI 55 y.o. female  presents for 3 month follow up with hypertension, hyperlipidemia, prediabetes and vitamin D.   Her blood pressure has been controlled at home, today their BP is BP: 126/82.   She does workout. She denies chest pain, shortness of breath, dizziness.  Patient states for past 6 months has been having lower AB tightness, states feels like she needs to have a BM, discomfort is relieved with BM, has had skinny stools however she states she has no issues with stools, no hard stool, straining. No blood in stool, mucus in stool, no nausea, diarrhea. Has never had colonoscopy. States she is having hard time finding someone to take her home. No urinary frequency.    She is on cholesterol medication and denies myalgias. Her cholesterol is at goal. The cholesterol last visit was:   Lab Results  Component Value Date   CHOL 162 12/07/2015   HDL 39 (L) 12/07/2015   LDLCALC 83 12/07/2015   TRIG 202 (H) 12/07/2015   CHOLHDL 4.2 12/07/2015    She has been working on diet and exercise for prediabetes,  and denies foot ulcerations, hyperglycemia, hypoglycemia , increased appetite, nausea, paresthesia of the feet, polydipsia, polyuria, visual disturbances, vomiting and weight loss. Last A1C in the office was:  Lab Results  Component Value Date   HGBA1C 5.3 12/07/2015   Patient is on Vitamin D supplement.  Lab Results  Component Value Date   VD25OH 73 12/07/2015   BMI is Body mass index is 34.51 kg/m., she is working on diet and exercise. Wt Readings from Last 3 Encounters:  03/08/16 205 lb 12.8 oz (93.4 kg)  12/07/15 204 lb 9.6 oz (92.8 kg)  09/07/15 206 lb (93.4 kg)      Current Medications:  Current Outpatient Prescriptions on File Prior to Visit  Medication Sig Dispense Refill  . albuterol (PROVENTIL HFA;VENTOLIN HFA) 108 (90 BASE) MCG/ACT inhaler Inhale into the lungs every 6 (six) hours as needed for wheezing or shortness of breath.    Marland Kitchen aspirin 81 MG tablet Take 81 mg by mouth daily.    Marland Kitchen azelastine (ASTELIN) 0.1 % nasal spray Place 1 spray into both nostrils 2 (two) times daily. Use in each nostril as directed 30 mL 2  . bisoprolol-hydrochlorothiazide (ZIAC) 5-6.25 MG tablet TAKE 1/2-1 TABLET BY MOUTH EVERY DAY FOR BLOOD PRESSURE 90 tablet 1  . BREO ELLIPTA 200-25 MCG/INH AEPB   5  . budesonide-formoterol (SYMBICORT) 160-4.5 MCG/ACT inhaler Inhale 2 puffs into the lungs 2 (two) times daily.    . Cholecalciferol (VITAMIN D PO) Take 5,000 Units by mouth. Alternates 5000 and 10000 units    .  co-enzyme Q-10 30 MG capsule Take 30 mg by mouth 3 (three) times daily.    Marland Kitchen glucosamine-chondroitin 500-400 MG tablet Take 1 tablet by mouth daily.    Marland Kitchen levocetirizine (XYZAL) 5 MG tablet   5  . losartan (COZAAR) 100 MG tablet TAKE ONE TABLET BY MOUTH EVERY DAY 90 tablet 2  . montelukast (SINGULAIR) 10 MG tablet Take 10 mg by mouth at bedtime.    . Multiple Vitamins-Minerals (MULTIVITAMIN PO) Take by mouth daily.    Marland Kitchen OVER THE COUNTER MEDICATION Bone strenght vitamin with Calcium and  Magnesium    . pravastatin (PRAVACHOL) 40 MG tablet TAKE ONE TABLET BY MOUTH AT BEDTIME FOR CHOLESTEROL 90 tablet 1   No current facility-administered medications on file prior to visit.     Medical History:  Past Medical History:  Diagnosis Date  . Allergy   . Asthma   . Hyperlipidemia   . Hypertension   . Migraine   . Obesity   . Prediabetes   . Vitamin D deficiency     Allergies:  Allergies  Allergen Reactions  . Ace Inhibitors     cough  . Azithromycin     rash  . Prednisone     High dose  . Vicodin [Hydrocodone-Acetaminophen]     Nausea/vomiting     Review of Systems:  Review of Systems  Constitutional: Negative for chills and fever.  HENT: Negative for congestion, ear pain and sore throat.   Respiratory: Negative for cough, sputum production, shortness of breath and wheezing.   Cardiovascular: Negative for chest pain, palpitations and leg swelling.  Gastrointestinal: Positive for abdominal pain. Negative for blood in stool, constipation, diarrhea, heartburn and melena.  Genitourinary: Negative.   Skin: Negative.   Neurological: Negative for dizziness, sensory change, loss of consciousness and headaches.  Psychiatric/Behavioral: Negative for depression. The patient is not nervous/anxious and does not have insomnia.     Family history- Review and unchanged  Social history- Review and unchanged  Physical Exam: BP 126/82   Pulse 78   Temp 97.7 F (36.5 C) (Temporal)   Resp 14   Ht 5' 4.75" (1.645 m)   Wt 205 lb 12.8 oz (93.4 kg)   SpO2 95%   BMI 34.51 kg/m  Wt Readings from Last 3 Encounters:  03/08/16 205 lb 12.8 oz (93.4 kg)  12/07/15 204 lb 9.6 oz (92.8 kg)  09/07/15 206 lb (93.4 kg)    General Appearance: Well nourished well developed, in no apparent distress. Eyes: PERRLA, EOMs, conjunctiva no swelling or erythema ENT/Mouth: Ear canals normal without obstruction, swelling, erythma, discharge.  TMs normal bilaterally.  Oropharynx moist, clear,  without exudate, or postoropharyngeal swelling. Neck: Supple, thyroid normal,no cervical adenopathy  Respiratory: Respiratory effort normal, Breath sounds clear A&P without rhonchi, wheeze, or rale.  No retractions, no accessory usage. Cardio: RRR with no MRGs. Brisk peripheral pulses without edema.  Abdomen: Soft, + BS,  Non tender, no guarding, rebound, hernias, masses. Musculoskeletal: Full ROM, 5/5 strength, Normal gait Skin: Warm, dry without rashes, lesions, ecchymosis.  Neuro: Awake and oriented X 3, Cranial nerves intact. Normal muscle tone, no cerebellar symptoms. Psych: Normal affect, Insight and Judgment appropriate.    Vicie Mutters, PA-C 4:45 PM [facility]

## 2016-03-09 LAB — LIPID PANEL
Cholesterol: 157 mg/dL (ref 125–200)
HDL: 47 mg/dL (ref >=46)
LDL CALC: 77 mg/dL (ref <130)
TRIGLYCERIDES: 166 mg/dL — AB (ref <150)
Total CHOL/HDL Ratio: 3.3 Ratio (ref <=5.0)
VLDL: 33 mg/dL — AB (ref <30)

## 2016-03-09 LAB — TSH: TSH: 3.09 mIU/L

## 2016-03-09 LAB — BASIC METABOLIC PANEL WITH GFR
BUN: 20 mg/dL (ref 7–25)
CHLORIDE: 105 mmol/L (ref 98–110)
CO2: 26 mmol/L (ref 20–31)
Calcium: 9.8 mg/dL (ref 8.6–10.4)
Creat: 0.76 mg/dL (ref 0.50–1.05)
GFR, EST NON AFRICAN AMERICAN: 88 mL/min (ref >=60)
GFR, Est African American: >89 mL/min (ref >=60)
GLUCOSE: 92 mg/dL (ref 65–99)
POTASSIUM: 4 mmol/L (ref 3.5–5.3)
Sodium: 141 mmol/L (ref 135–146)

## 2016-03-09 LAB — HEPATIC FUNCTION PANEL
ALBUMIN: 4.4 g/dL (ref 3.6–5.1)
ALK PHOS: 81 U/L (ref 33–130)
ALT: 24 U/L (ref 6–29)
AST: 23 U/L (ref 10–35)
BILIRUBIN INDIRECT: 0.2 mg/dL (ref 0.2–1.2)
BILIRUBIN TOTAL: 0.3 mg/dL (ref 0.2–1.2)
Bilirubin, Direct: 0.1 mg/dL (ref <=0.2)
Total Protein: 6.6 g/dL (ref 6.1–8.1)

## 2016-03-20 ENCOUNTER — Ambulatory Visit
Admission: RE | Admit: 2016-03-20 | Discharge: 2016-03-20 | Disposition: A | Discharge status: Home / Self Care | Payer: BLUE CROSS/BLUE SHIELD | Source: Ambulatory Visit | Attending: Obstetrics and Gynecology | Admitting: Obstetrics and Gynecology

## 2016-03-20 DIAGNOSIS — Z1231 Encounter for screening mammogram for malignant neoplasm of breast: Secondary | ICD-10-CM

## 2016-03-28 DIAGNOSIS — R1084 Generalized abdominal pain: Secondary | ICD-10-CM | POA: Diagnosis not present

## 2016-03-28 DIAGNOSIS — Z1211 Encounter for screening for malignant neoplasm of colon: Secondary | ICD-10-CM | POA: Diagnosis not present

## 2016-03-28 DIAGNOSIS — Z800 Family history of malignant neoplasm of digestive organs: Secondary | ICD-10-CM | POA: Diagnosis not present

## 2016-03-30 DIAGNOSIS — R1084 Generalized abdominal pain: Secondary | ICD-10-CM | POA: Diagnosis not present

## 2016-03-30 DIAGNOSIS — K760 Fatty (change of) liver, not elsewhere classified: Secondary | ICD-10-CM | POA: Diagnosis not present

## 2016-04-03 DIAGNOSIS — Z800 Family history of malignant neoplasm of digestive organs: Secondary | ICD-10-CM | POA: Diagnosis not present

## 2016-04-03 DIAGNOSIS — K2950 Unspecified chronic gastritis without bleeding: Secondary | ICD-10-CM | POA: Diagnosis not present

## 2016-04-03 DIAGNOSIS — D125 Benign neoplasm of sigmoid colon: Secondary | ICD-10-CM | POA: Diagnosis not present

## 2016-04-03 DIAGNOSIS — Z1211 Encounter for screening for malignant neoplasm of colon: Secondary | ICD-10-CM | POA: Diagnosis not present

## 2016-04-03 DIAGNOSIS — R1013 Epigastric pain: Secondary | ICD-10-CM | POA: Diagnosis not present

## 2016-04-03 DIAGNOSIS — K635 Polyp of colon: Secondary | ICD-10-CM | POA: Diagnosis not present

## 2016-04-03 DIAGNOSIS — R1084 Generalized abdominal pain: Secondary | ICD-10-CM | POA: Diagnosis not present

## 2016-04-03 DIAGNOSIS — K2900 Acute gastritis without bleeding: Secondary | ICD-10-CM | POA: Diagnosis not present

## 2016-04-25 DIAGNOSIS — Z01419 Encounter for gynecological examination (general) (routine) without abnormal findings: Secondary | ICD-10-CM | POA: Diagnosis not present

## 2016-04-25 DIAGNOSIS — N905 Atrophy of vulva: Secondary | ICD-10-CM | POA: Diagnosis not present

## 2016-04-25 DIAGNOSIS — R1032 Left lower quadrant pain: Secondary | ICD-10-CM | POA: Diagnosis not present

## 2016-04-25 DIAGNOSIS — R1031 Right lower quadrant pain: Secondary | ICD-10-CM | POA: Diagnosis not present

## 2016-05-02 DIAGNOSIS — R1031 Right lower quadrant pain: Secondary | ICD-10-CM | POA: Diagnosis not present

## 2016-05-02 DIAGNOSIS — R1032 Left lower quadrant pain: Secondary | ICD-10-CM | POA: Diagnosis not present

## 2016-05-02 DIAGNOSIS — D251 Intramural leiomyoma of uterus: Secondary | ICD-10-CM | POA: Diagnosis not present

## 2016-06-03 ENCOUNTER — Encounter: Payer: Self-pay | Admitting: *Deleted

## 2016-06-21 DIAGNOSIS — H52222 Regular astigmatism, left eye: Secondary | ICD-10-CM | POA: Diagnosis not present

## 2016-07-05 ENCOUNTER — Encounter: Payer: Self-pay | Admitting: Internal Medicine

## 2016-07-05 ENCOUNTER — Ambulatory Visit (INDEPENDENT_AMBULATORY_CARE_PROVIDER_SITE_OTHER): Payer: BLUE CROSS/BLUE SHIELD | Admitting: Internal Medicine

## 2016-07-05 VITALS — BP 144/90 | HR 64 | Temp 97.4°F | Resp 16 | Ht 64.0 in | Wt 210.2 lb

## 2016-07-05 DIAGNOSIS — E782 Mixed hyperlipidemia: Secondary | ICD-10-CM

## 2016-07-05 DIAGNOSIS — I10 Essential (primary) hypertension: Secondary | ICD-10-CM

## 2016-07-05 DIAGNOSIS — Z136 Encounter for screening for cardiovascular disorders: Secondary | ICD-10-CM | POA: Diagnosis not present

## 2016-07-05 DIAGNOSIS — E559 Vitamin D deficiency, unspecified: Secondary | ICD-10-CM | POA: Diagnosis not present

## 2016-07-05 DIAGNOSIS — Z79899 Other long term (current) drug therapy: Secondary | ICD-10-CM | POA: Diagnosis not present

## 2016-07-05 DIAGNOSIS — Z111 Encounter for screening for respiratory tuberculosis: Secondary | ICD-10-CM

## 2016-07-05 DIAGNOSIS — Z1212 Encounter for screening for malignant neoplasm of rectum: Secondary | ICD-10-CM

## 2016-07-05 DIAGNOSIS — K219 Gastro-esophageal reflux disease without esophagitis: Secondary | ICD-10-CM

## 2016-07-05 DIAGNOSIS — R5383 Other fatigue: Secondary | ICD-10-CM

## 2016-07-05 DIAGNOSIS — Z0000 Encounter for general adult medical examination without abnormal findings: Secondary | ICD-10-CM

## 2016-07-05 DIAGNOSIS — R7303 Prediabetes: Secondary | ICD-10-CM

## 2016-07-05 DIAGNOSIS — Z0001 Encounter for general adult medical examination with abnormal findings: Secondary | ICD-10-CM

## 2016-07-05 LAB — CBC WITH DIFFERENTIAL/PLATELET
BASOS ABS: 74 {cells}/uL (ref 0–200)
Basophils Relative: 1 %
EOS ABS: 148 {cells}/uL (ref 15–500)
EOS PCT: 2 %
HCT: 41 % (ref 35.0–45.0)
Hemoglobin: 13.6 g/dL (ref 11.7–15.5)
LYMPHS PCT: 27 %
Lymphs Abs: 1998 cells/uL (ref 850–3900)
MCH: 30.8 pg (ref 27.0–33.0)
MCHC: 33.2 g/dL (ref 32.0–36.0)
MCV: 92.8 fL (ref 80.0–100.0)
MONOS PCT: 6 %
MPV: 10.6 fL (ref 7.5–12.5)
Monocytes Absolute: 444 cells/uL (ref 200–950)
NEUTROS ABS: 4736 {cells}/uL (ref 1500–7800)
NEUTROS PCT: 64 %
PLATELETS: 260 10*3/uL (ref 140–400)
RBC: 4.42 MIL/uL (ref 3.80–5.10)
RDW: 13.4 % (ref 11.0–15.0)
WBC: 7.4 10*3/uL (ref 3.8–10.8)

## 2016-07-05 LAB — TSH: TSH: 2.95 m[IU]/L

## 2016-07-05 LAB — HEMOGLOBIN A1C
HEMOGLOBIN A1C: 5.3 % (ref <5.7)
Mean Plasma Glucose: 105 mg/dL

## 2016-07-05 LAB — VITAMIN B12: VITAMIN B 12: 457 pg/mL (ref 200–1100)

## 2016-07-05 NOTE — Progress Notes (Signed)
[facility] Unk Pinto, M.D.    Uvaldo Bristle. Silverio Lay, P.A.-C      Starlyn Skeans, P.A.-C  [facility]                [address] [place], [place] [id number] Telephone [phone] Telefax [phone]  Annual Screening/Preventative Visit & Comprehensive Evaluation &  Examination     This very nice 55 y.o. DWF presents for a Screening/Preventative Visit & comprehensive evaluation and management of multiple medical co-morbidities.  Patient has been followed for HTN, Prediabetes, Hyperlipidemia and Vitamin D Deficiency.      HTN predates circa 2007. Patient's BP has been controlled at home and patient denies any cardiac symptoms as chest pain, palpitations, shortness of breath, dizziness or ankle swelling. Today's BP is elevated at 144/90 and rechecked at 134/84.      Patient's hyperlipidemia is controlled with diet and medications. Patient denies myalgias or other medication SE's. Last lipids were at goal: Lab Results  Component Value Date   CHOL 157 03/08/2016   HDL 47 03/08/2016   LDLCALC 77 03/08/2016   TRIG 166 (H) 03/08/2016   CHOLHDL 3.3 03/08/2016      Patient has Morbid Obesity (BMI 36+) and consequent  prediabetes predating with A1c 5.8% and Insulin 37 in 2012 and patient denies reactive hypoglycemic symptoms, visual blurring, diabetic polys, or paresthesias. Last A1c was at goal:  Lab Results  Component Value Date   HGBA1C 5.3 12/07/2015      Finally, patient has history of Vitamin D Deficiency  In 2008 of "22"and last Vitamin D was at goal: Lab Results  Component Value Date   VD25OH 73 12/07/2015   Current Outpatient Prescriptions on File Prior to Visit  Medication Sig  . albuterol (PROVENTIL HFA;VENTOLIN HFA) 108 (90 BASE) MCG/ACT inhaler Inhale into the lungs every 6 (six) hours as needed for wheezing or shortness of breath.  Marland Kitchen azelastine (ASTELIN) 0.1 % nasal spray Place 1 spray  into both nostrils 2 (two) times daily. Use in each nostril as directed  . bisoprolol-hydrochlorothiazide (ZIAC) 5-6.25 MG tablet TAKE 1/2-1 TABLET BY MOUTH EVERY DAY FOR BLOOD PRESSURE  . BREO ELLIPTA 200-25 MCG/INH AEPB   . Cholecalciferol (VITAMIN D PO) Take 5,000 Units by mouth. Alternates 5000 and 10000 units  . co-enzyme Q-10 30 MG capsule Take 30 mg by mouth 3 (three) times daily.  Marland Kitchen glucosamine-chondroitin 500-400 MG tablet Take 1 tablet by mouth daily.  Marland Kitchen levocetirizine (XYZAL) 5 MG tablet   . losartan (COZAAR) 100 MG tablet TAKE ONE TABLET BY MOUTH EVERY DAY  . montelukast (SINGULAIR) 10 MG tablet Take 10 mg by mouth at bedtime.  . Multiple Vitamins-Minerals (MULTIVITAMIN PO) Take by mouth daily.  Marland Kitchen OVER THE COUNTER MEDICATION Bone strenght vitamin with Calcium and Magnesium  . pravastatin (PRAVACHOL) 40 MG tablet TAKE ONE TABLET BY MOUTH AT BEDTIME FOR CHOLESTEROL  . aspirin 81 MG tablet Take 81 mg by mouth daily.   No current facility-administered medications on file prior to visit.    Allergies  Allergen Reactions  . Ace Inhibitors     cough  . Azithromycin     rash  . Prednisone     High dose  . Vicodin [Hydrocodone-Acetaminophen]     Nausea/vomiting   Past Medical History:  Diagnosis Date  . Allergy   . Asthma   . Hyperlipidemia   .  Hypertension   . Migraine   . Obesity   . Prediabetes   . Vitamin D deficiency    Health Maintenance  Topic Date Due  . Hepatitis C Screening  10/04/1960  . HIV Screening  05/31/1974  . PAP SMEAR  12/27/2012  . INFLUENZA VACCINE  03/07/2016  . TETANUS/TDAP  08/29/2017  . MAMMOGRAM  03/20/2018  . COLONOSCOPY  04/03/2026   Immunization History  Administered Date(s) Administered  . PPD Test 05/12/2014, 05/25/2015, 07/05/2016  . Pneumococcal-Unspecified 08/29/2009  . Tdap 08/30/2007   Past Surgical History:  Procedure Laterality Date  . DILATION AND CURETTAGE, DIAGNOSTIC / THERAPEUTIC  2009  . LYMPH NODE BIOPSY Left 1991    negative  . NEVUS EXCISION  2012   dysplastic from back   Family History  Problem Relation Age of Onset  . Hypertension Mother   . COPD Father   . Cancer Father     colon  . Heart disease Father   . Hyperlipidemia Father   . Hypertension Father   . Stroke Father    Social History  Substance Use Topics  . Smoking status: Never Smoker  . Smokeless tobacco: Never Used  . Alcohol use No    ROS Constitutional: Denies fever, chills, weight loss/gain, headaches, insomnia,  night sweats, and change in appetite. Does c/o fatigue. Eyes: Denies redness, blurred vision, diplopia, discharge, itchy, watery eyes.  ENT: Denies discharge, congestion, post nasal drip, epistaxis, sore throat, earache, hearing loss, dental pain, Tinnitus, Vertigo, Sinus pain, snoring.  Cardio: Denies chest pain, palpitations, irregular heartbeat, syncope, dyspnea, diaphoresis, orthopnea, PND, claudication, edema Respiratory: denies cough, dyspnea, DOE, pleurisy, hoarseness, laryngitis, wheezing.  Gastrointestinal: Denies dysphagia, heartburn, reflux, water brash, pain, cramps, nausea, vomiting, bloating, diarrhea, constipation, hematemesis, melena, hematochezia, jaundice, hemorrhoids Genitourinary: Denies dysuria, frequency, urgency, nocturia, hesitancy, discharge, hematuria, flank pain Breast: Breast lumps, nipple discharge, bleeding.  Musculoskeletal: Denies arthralgia, myalgia, stiffness, Jt. Swelling, pain, limp, and strain/sprain. Denies falls. Skin: Denies puritis, rash, hives, warts, acne, eczema, changing in skin lesion Neuro: No weakness, tremor, incoordination, spasms, paresthesia, pain Psychiatric: Denies confusion, memory loss, sensory loss. Denies Depression. Endocrine: Denies change in weight, skin, hair change, nocturia, and paresthesia, diabetic polys, visual blurring, hyper / hypo glycemic episodes.  Heme/Lymph: No excessive bleeding, bruising, enlarged lymph nodes.  Physical Exam  BP  144/90->  134/84   Pulse 64   Temp 97.4 F (36.3 C)   Resp 16   Ht 5\' 4"  (1.626 m)   Wt 210 lb 3.2 oz (95.3 kg)   BMI 36.08 kg/m   General Appearance: Well nourished and in no apparent distress.  Eyes: PERRLA, EOMs, conjunctiva no swelling or erythema, normal fundi and vessels. Sinuses: No frontal/maxillary tenderness ENT/Mouth: EACs patent / TMs  nl. Nares clear without erythema, swelling, mucoid exudates. Oral hygiene is good. No erythema, swelling, or exudate. Tongue normal, non-obstructing. Tonsils not swollen or erythematous. Hearing normal.  Neck: Supple, thyroid normal. No bruits, nodes or JVD. Respiratory: Respiratory effort normal.  BS equal and clear bilateral without rales, rhonci, wheezing or stridor. Cardio: Heart sounds are normal with regular rate and rhythm and no murmurs, rubs or gallops. Peripheral pulses are normal and equal bilaterally without edema. No aortic or femoral bruits. Chest: symmetric with normal excursions and percussion. Breasts: Symmetric, without lumps, nipple discharge, retractions, or fibrocystic changes.  Abdomen: Flat, soft with bowel sounds active. Nontender, no guarding, rebound, hernias, masses, or organomegaly.  Lymphatics: Non tender without lymphadenopathy.  Genitourinary:  Musculoskeletal: Full ROM  all peripheral extremities, joint stability, 5/5 strength, and normal gait. Skin: Warm and dry without rashes, lesions, cyanosis, clubbing or  ecchymosis.  Neuro: Cranial nerves intact, reflexes equal bilaterally. Normal muscle tone, no cerebellar symptoms. Sensation intact.  Pysch: Alert and oriented X 3, normal affect, Insight and Judgment appropriate.   Assessment and Plan  1. Annual Preventative Screening Examination  - Microalbumin / creatinine urine ratio - EKG 12-Lead - [place], RETROPERITNL ABD,  LTD - POC Hemoccult Bld/Stl  - Urinalysis, Routine w reflex microscopic  - Vitamin B12 - Iron and TIBC - CBC with Differential/Platelet - BASIC  METABOLIC PANEL WITH GFR - Hepatic function panel - Magnesium - Lipid panel - TSH - Hemoglobin A1c - Insulin, random - VITAMIN D 25 Hydroxy   2. Essential hypertension  - Microalbumin / creatinine urine ratio - EKG 12-Lead - [place], RETROPERITNL ABD,  LTD - TSH  3. Mixed hyperlipidemia  - EKG 12-Lead - [place], RETROPERITNL ABD,  LTD - Lipid panel - TSH  4. Prediabetes  - EKG 12-Lead - [place], RETROPERITNL ABD,  LTD - Hemoglobin A1c - Insulin, random  5. Vitamin D deficiency  - VITAMIN D 25 Hydroxy  6. Gastroesophageal reflux disease   7. Screening for rectal cancer  - POC Hemoccult Bld/Stl                                                                                                                                                                                                                                                                         8. Screening examination for pulmonary tuberculosis  - PPD  9. Screening for ischemic heart disease  - EKG 12-Lead  10. Screening for AAA (aortic abdominal aneurysm)  - [place], RETROPERITNL ABD,  LTD  11. Other fatigue  - Vitamin B12 - Iron and TIBC - CBC with Differential/Platelet - TSH  12. Medication management  - Urinalysis, Routine w reflex microscopic  - CBC with Differential/Platelet - BASIC METABOLIC PANEL WITH GFR - Hepatic function panel - Magnesium       Continue prudent diet as discussed, weight control, BP monitoring, regular exercise, and medications. Discussed med's effects and SE's. Screening labs and tests as requested with regular follow-up as recommended. Over  40 minutes of exam, counseling, chart review and high complex critical decision making was performed.

## 2016-07-05 NOTE — Patient Instructions (Signed)

## 2016-07-06 ENCOUNTER — Other Ambulatory Visit: Payer: Self-pay | Admitting: Internal Medicine

## 2016-07-06 LAB — LIPID PANEL
CHOL/HDL RATIO: 4.1 ratio (ref <5.0)
Cholesterol: 162 mg/dL (ref <200)
HDL: 40 mg/dL — AB (ref >50)
LDL CALC: 66 mg/dL (ref <100)
Triglycerides: 280 mg/dL — ABNORMAL HIGH (ref <150)
VLDL: 56 mg/dL — AB (ref <30)

## 2016-07-06 LAB — URINALYSIS, ROUTINE W REFLEX MICROSCOPIC
BILIRUBIN URINE: NEGATIVE
GLUCOSE, UA: NEGATIVE
Hgb urine dipstick: NEGATIVE
Ketones, ur: NEGATIVE
LEUKOCYTES UA: NEGATIVE
Nitrite: NEGATIVE
PROTEIN: NEGATIVE
SPECIFIC GRAVITY, URINE: 1.017 (ref 1.001–1.035)
pH: 5.5 (ref 5.0–8.0)

## 2016-07-06 LAB — VITAMIN D 25 HYDROXY (VIT D DEFICIENCY, FRACTURES): Vit D, 25-Hydroxy: 73 ng/mL (ref 30–100)

## 2016-07-06 LAB — IRON AND TIBC
%SAT: 24 % (ref 11–50)
Iron: 78 ug/dL (ref 45–160)
TIBC: 324 ug/dL (ref 250–450)
UIBC: 246 ug/dL (ref 125–400)

## 2016-07-06 LAB — MICROALBUMIN / CREATININE URINE RATIO
Creatinine, Urine: 111 mg/dL (ref 20–320)
Microalb Creat Ratio: 4 mcg/mg creat (ref <30)
Microalb, Ur: 0.4 mg/dL

## 2016-07-06 LAB — BASIC METABOLIC PANEL WITH GFR
BUN: 17 mg/dL (ref 7–25)
CALCIUM: 9.9 mg/dL (ref 8.6–10.4)
CHLORIDE: 104 mmol/L (ref 98–110)
CO2: 22 mmol/L (ref 20–31)
CREATININE: 0.92 mg/dL (ref 0.50–1.05)
GFR, EST AFRICAN AMERICAN: 80 mL/min (ref >=60)
GFR, Est Non African American: 69 mL/min (ref >=60)
Glucose, Bld: 95 mg/dL (ref 65–99)
Potassium: 4 mmol/L (ref 3.5–5.3)
SODIUM: 141 mmol/L (ref 135–146)

## 2016-07-06 LAB — HEPATIC FUNCTION PANEL
ALK PHOS: 80 U/L (ref 33–130)
ALT: 27 U/L (ref 6–29)
AST: 22 U/L (ref 10–35)
Albumin: 4.6 g/dL (ref 3.6–5.1)
BILIRUBIN DIRECT: 0.1 mg/dL (ref <=0.2)
BILIRUBIN TOTAL: 0.4 mg/dL (ref 0.2–1.2)
Indirect Bilirubin: 0.3 mg/dL (ref 0.2–1.2)
Total Protein: 6.8 g/dL (ref 6.1–8.1)

## 2016-07-06 LAB — INSULIN, RANDOM: Insulin: 33.4 u[IU]/mL — ABNORMAL HIGH (ref 2.0–19.6)

## 2016-07-06 LAB — MAGNESIUM: Magnesium: 2.2 mg/dL (ref 1.5–2.5)

## 2016-07-10 LAB — TB SKIN TEST
Induration: 0 mm
TB SKIN TEST: NEGATIVE

## 2016-08-01 DIAGNOSIS — J4530 Mild persistent asthma, uncomplicated: Secondary | ICD-10-CM | POA: Diagnosis not present

## 2016-08-01 DIAGNOSIS — J3089 Other allergic rhinitis: Secondary | ICD-10-CM | POA: Diagnosis not present

## 2016-08-01 DIAGNOSIS — H1045 Other chronic allergic conjunctivitis: Secondary | ICD-10-CM | POA: Diagnosis not present

## 2016-08-01 DIAGNOSIS — J301 Allergic rhinitis due to pollen: Secondary | ICD-10-CM | POA: Diagnosis not present

## 2016-09-12 ENCOUNTER — Other Ambulatory Visit: Payer: Self-pay | Admitting: Internal Medicine

## 2016-10-10 ENCOUNTER — Encounter: Payer: Self-pay | Admitting: Physician Assistant

## 2016-10-10 ENCOUNTER — Ambulatory Visit (INDEPENDENT_AMBULATORY_CARE_PROVIDER_SITE_OTHER): Payer: BLUE CROSS/BLUE SHIELD | Admitting: Physician Assistant

## 2016-10-10 VITALS — BP 138/80 | HR 70 | Temp 97.4°F | Resp 14 | Ht 64.0 in | Wt 212.8 lb

## 2016-10-10 DIAGNOSIS — R7303 Prediabetes: Secondary | ICD-10-CM

## 2016-10-10 DIAGNOSIS — Z79899 Other long term (current) drug therapy: Secondary | ICD-10-CM | POA: Diagnosis not present

## 2016-10-10 DIAGNOSIS — E782 Mixed hyperlipidemia: Secondary | ICD-10-CM | POA: Diagnosis not present

## 2016-10-10 DIAGNOSIS — E559 Vitamin D deficiency, unspecified: Secondary | ICD-10-CM

## 2016-10-10 DIAGNOSIS — H6121 Impacted cerumen, right ear: Secondary | ICD-10-CM | POA: Diagnosis not present

## 2016-10-10 DIAGNOSIS — H9201 Otalgia, right ear: Secondary | ICD-10-CM | POA: Diagnosis not present

## 2016-10-10 DIAGNOSIS — I10 Essential (primary) hypertension: Secondary | ICD-10-CM

## 2016-10-10 LAB — HEPATIC FUNCTION PANEL
ALBUMIN: 4.3 g/dL (ref 3.6–5.1)
ALK PHOS: 82 U/L (ref 33–130)
ALT: 24 U/L (ref 6–29)
AST: 21 U/L (ref 10–35)
Bilirubin, Direct: 0.1 mg/dL (ref <=0.2)
Indirect Bilirubin: 0.3 mg/dL (ref 0.2–1.2)
TOTAL PROTEIN: 6.4 g/dL (ref 6.1–8.1)
Total Bilirubin: 0.4 mg/dL (ref 0.2–1.2)

## 2016-10-10 LAB — LIPID PANEL
Cholesterol: 146 mg/dL (ref <200)
HDL: 44 mg/dL — ABNORMAL LOW (ref >50)
LDL CALC: 74 mg/dL (ref <100)
TRIGLYCERIDES: 138 mg/dL (ref <150)
Total CHOL/HDL Ratio: 3.3 Ratio (ref <5.0)
VLDL: 28 mg/dL (ref <30)

## 2016-10-10 LAB — BASIC METABOLIC PANEL WITH GFR
BUN: 17 mg/dL (ref 7–25)
CO2: 23 mmol/L (ref 20–31)
CREATININE: 1.03 mg/dL (ref 0.50–1.05)
Calcium: 9.7 mg/dL (ref 8.6–10.4)
Chloride: 107 mmol/L (ref 98–110)
GFR, EST AFRICAN AMERICAN: 70 mL/min (ref >=60)
GFR, Est Non African American: 60 mL/min (ref >=60)
GLUCOSE: 98 mg/dL (ref 65–99)
POTASSIUM: 4.2 mmol/L (ref 3.5–5.3)
Sodium: 142 mmol/L (ref 135–146)

## 2016-10-10 LAB — CBC WITH DIFFERENTIAL/PLATELET
BASOS PCT: 1 %
Basophils Absolute: 59 cells/uL (ref 0–200)
EOS ABS: 177 {cells}/uL (ref 15–500)
Eosinophils Relative: 3 %
HEMATOCRIT: 39.4 % (ref 35.0–45.0)
HEMOGLOBIN: 13.2 g/dL (ref 11.7–15.5)
LYMPHS ABS: 1652 {cells}/uL (ref 850–3900)
Lymphocytes Relative: 28 %
MCH: 30.6 pg (ref 27.0–33.0)
MCHC: 33.5 g/dL (ref 32.0–36.0)
MCV: 91.2 fL (ref 80.0–100.0)
MONO ABS: 354 {cells}/uL (ref 200–950)
MPV: 10 fL (ref 7.5–12.5)
Monocytes Relative: 6 %
NEUTROS ABS: 3658 {cells}/uL (ref 1500–7800)
Neutrophils Relative %: 62 %
Platelets: 253 10*3/uL (ref 140–400)
RBC: 4.32 MIL/uL (ref 3.80–5.10)
RDW: 13.7 % (ref 11.0–15.0)
WBC: 5.9 10*3/uL (ref 3.8–10.8)

## 2016-10-10 LAB — TSH: TSH: 3.34 m[IU]/L

## 2016-10-10 LAB — MAGNESIUM: MAGNESIUM: 2.1 mg/dL (ref 1.5–2.5)

## 2016-10-10 NOTE — Patient Instructions (Addendum)
Try to get at least 80 oz of water but if you can 100 oz of water Make sure you are eating 3 meals a day- try to do protein in the morning    Simple math prevails.    1st - exercise does not produce significant weight loss - at best one converts fat into muscle , "bulks up", loses inches, but usually stays "weight neutral"     2nd - think of your body weightas a check book: If you eat more calories than you burn up - you save money or gain weight .... Or if you spend more money than you put in the check book, ie burn up more calories than you eat, then you lose weight     3rd - if you walk or run 1 mile, you burn up 100 calories - you have to burn up 3,500 calories to lose 1 pound, ie you have to walk/run 35 miles to lose 1 measly pound. So if you want to lose 10 #, then you have to walk/run 350 miles, so.... clearly exercise is not the solution.     4. So if you consume 1,500 calories, then you have to burn up the equivalent of 15 miles to stay weight neutral - It also stands to reason that if you consume 1,500 cal/day and don't lose weight, then you must be burning up about 1,500 cals/day to stay weight neutral.     5. If you really want to lose weight, you must cut your calorie intake 300 calories /day and at that rate you should lose about 1 # every 3 days.   6. Please purchase Dr Fara Olden Fuhrman's book(s) "The End of Dieting" & "Eat to Live" . It has some great concepts and recipes.    Before you even begin to attack a weight-loss plan, it pays to remember this: You are not fat. You have fat. Losing weight isn't about blame or shame; it's simply another achievement to accomplish. Dieting is like any other skill-you have to buckle down and work at it. As long as you act in a smart, reasonable way, you'll ultimately get where you want to be. Here are some weight loss pearls for you.  1. It's Not a Diet. It's a Lifestyle Thinking of a diet as something you're on and suffering through only for  the short term doesn't work. To shed weight and keep it off, you need to make permanent changes to the way you eat. It's OK to indulge occasionally, of course, but if you cut calories temporarily and then revert to your old way of eating, you'll gain back the weight quicker than you can say yo-yo. Use it to lose it. Research shows that one of the best predictors of long-term weight loss is how many pounds you drop in the first month. For that reason, nutritionists often suggest being stricter for the first two weeks of your new eating strategy to build momentum. Cut out added sugar and alcohol and avoid unrefined carbs. After that, figure out how you can reincorporate them in a way that's healthy and maintainable.  2. There's a Right Way to Exercise Working out burns calories and fat and boosts your metabolism by building muscle. But those trying to lose weight are notorious for overestimating the number of calories they burn and underestimating the amount they take in. Unfortunately, your system is biologically programmed to hold on to extra pounds and that means when you start exercising, your body senses the  deficit and ramps up its hunger signals. If you're not diligent, you'll eat everything you burn and then some. Use it to lose it. Cardio gets all the exercise glory, but strength and interval training are the real heroes. They help you build lean muscle, which in turn increases your metabolism and calorie-burning ability 3. Don't Overreact to Mild Hunger Some people have a hard time losing weight because of hunger anxiety. To them, being hungry is bad-something to be avoided at all costs-so they carry snacks with them and eat when they don't need to. Others eat because they're stressed out or bored. While you never want to get to the point of being ravenous (that's when bingeing is likely to happen), a hunger pang, a craving, or the fact that it's 3:00 p.m. should not send you racing for the vending  machine or obsessing about the energy bar in your purse. Ideally, you should put off eating until your stomach is growling and it's difficult to concentrate.  Use it to lose it. When you feel the urge to eat, use the HALT method. Ask yourself, Am I really hungry? Or am I angry or anxious, lonely or bored, or tired? If you're still not certain, try the apple test. If you're truly hungry, an apple should seem delicious; if it doesn't, something else is going on. Or you can try drinking water and making yourself busy, if you are still hungry try a healthy snack.  4. Not All Calories Are Created Equal The mechanics of weight loss are pretty simple: Take in fewer calories than you use for energy. But the kind of food you eat makes all the difference. Processed food that's high in saturated fat and refined starch or sugar can cause inflammation that disrupts the hormone signals that tell your brain you're full. The result: You eat a lot more.  Use it to lose it. Clean up your diet. Swap in whole, unprocessed foods, including vegetables, lean protein, and healthy fats that will fill you up and give you the biggest nutritional bang for your calorie buck. In a few weeks, as your brain starts receiving regular hunger and fullness signals once again, you'll notice that you feel less hungry overall and naturally start cutting back on the amount you eat.  5. Protein, Produce, and Plant-Based Fats Are Your Weight-Loss Trinity Here's why eating the three Ps regularly will help you drop pounds. Protein fills you up. You need it to build lean muscle, which keeps your metabolism humming so that you can torch more fat. People in a weight-loss program who ate double the recommended daily allowance for protein (about 110 grams for a 150-pound woman) lost 70 percent of their weight from fat, while people who ate the RDA lost only about 40 percent, one study found. Produce is packed with filling fiber. "It's very difficult to  consume too many calories if you're eating a lot of vegetables. Example: Three cups of broccoli is a lot of food, yet only 93 calories. (Fruit is another story. It can be easy to overeat and can contain a lot of calories from sugar, so be sure to monitor your intake.) Plant-based fats like olive oil and those in avocados and nuts are healthy and extra satiating.  Use it to lose it. Aim to incorporate each of the three Ps into every meal and snack. People who eat protein throughout the day are able to keep weight off, according to a study in the [employer].  In addition to meat, poultry and seafood, good sources are beans, lentils, eggs, tofu, and yogurt. As for fat, keep portion sizes in check by measuring out salad dressing, oil, and nut butters (shoot for one to two tablespoons). Finally, eat veggies or a little fruit at every meal. People who did that consumed 308 fewer calories but didn't feel any hungrier than when they didn't eat more produce.  7. How You Eat Is As Important As What You Eat In order for your brain to register that you're full, you need to focus on what you're eating. Sit down whenever you eat, preferably at a table. Turn off the TV or computer, put down your phone, and look at your food. Smell it. Chew slowly, and don't put another bite on your fork until you swallow. When women ate lunch this attentively, they consumed 30 percent less when snacking later than those who listened to an audiobook at lunchtime, according to a study in the [place] of [employer]. 8. Weighing Yourself Really Works The scale provides the best evidence about whether your efforts are paying off. Seeing the numbers tick up or down or stagnate is motivation to keep going-or to rethink your approach. A 2015 study at [facility] found that daily weigh-ins helped people lose more weight, keep it off, and maintain that loss, even after two years. Use it to lose it. Step on the  scale at the same time every day for the best results. If your weight shoots up several pounds from one weigh-in to the next, don't freak out. Eating a lot of salt the night before or having your period is the likely culprit. The number should return to normal in a day or two. It's a steady climb that you need to do something about. 9. Too Much Stress and Too Little Sleep Are Your Enemies When you're tired and frazzled, your body cranks up the production of cortisol, the stress hormone that can cause carb cravings. Not getting enough sleep also boosts your levels of ghrelin, a hormone associated with hunger, while suppressing leptin, a hormone that signals fullness and satiety. People on a diet who slept only five and a half hours a night for two weeks lost 55 percent less fat and were hungrier than those who slept eight and a half hours, according to a study in the [place]. Use it to lose it. Prioritize sleep, aiming for seven hours or more a night, which research shows helps lower stress. And make sure you're getting quality zzz's. If a snoring spouse or a fidgety cat wakes you up frequently throughout the night, you may end up getting the equivalent of just four hours of sleep, according to a study from [facility]. Keep pets out of the bedroom, and use a white-noise app to drown out snoring. 10. You Will Hit a plateau-And You Can Bust Through It As you slim down, your body releases much less leptin, the fullness hormone.  If you're not strength training, start right now. Building muscle can raise your metabolism to help you overcome a plateau. To keep your body challenged and burning calories, incorporate new moves and more intense intervals into your workouts or add another sweat session to your weekly routine. Alternatively, cut an extra 100 calories or so a day from your diet. Now that you've lost weight, your body simply doesn't need as much fuel.
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
## 2016-10-11 ENCOUNTER — Other Ambulatory Visit: Payer: Self-pay

## 2016-10-11 LAB — VITAMIN D 25 HYDROXY (VIT D DEFICIENCY, FRACTURES): VIT D 25 HYDROXY: 92 ng/mL (ref 30–100)

## 2016-10-11 LAB — HEMOGLOBIN A1C
HEMOGLOBIN A1C: 5.2 % (ref <5.7)
Mean Plasma Glucose: 103 mg/dL

## 2016-10-11 MED ORDER — LOSARTAN POTASSIUM 100 MG PO TABS: 100.0000 mg | ORAL_TABLET | Freq: Every day | ORAL | 0 refills | Status: DC

## 2016-10-11 NOTE — Progress Notes (Signed)
Pt aware of lab results & voiced understanding of those results.

## 2017-01-10 ENCOUNTER — Other Ambulatory Visit: Payer: Self-pay | Admitting: Internal Medicine

## 2017-01-10 ENCOUNTER — Ambulatory Visit (INDEPENDENT_AMBULATORY_CARE_PROVIDER_SITE_OTHER): Payer: BLUE CROSS/BLUE SHIELD | Admitting: Internal Medicine

## 2017-01-10 ENCOUNTER — Other Ambulatory Visit: Payer: Self-pay | Admitting: Physician Assistant

## 2017-01-10 VITALS — BP 134/76 | HR 80 | Temp 97.3°F | Resp 16 | Ht 64.0 in | Wt 198.8 lb

## 2017-01-10 DIAGNOSIS — I10 Essential (primary) hypertension: Secondary | ICD-10-CM

## 2017-01-10 DIAGNOSIS — E559 Vitamin D deficiency, unspecified: Secondary | ICD-10-CM

## 2017-01-10 DIAGNOSIS — Z79899 Other long term (current) drug therapy: Secondary | ICD-10-CM

## 2017-01-10 DIAGNOSIS — K219 Gastro-esophageal reflux disease without esophagitis: Secondary | ICD-10-CM

## 2017-01-10 DIAGNOSIS — R7303 Prediabetes: Secondary | ICD-10-CM

## 2017-01-10 DIAGNOSIS — E782 Mixed hyperlipidemia: Secondary | ICD-10-CM | POA: Diagnosis not present

## 2017-01-10 LAB — CBC WITH DIFFERENTIAL/PLATELET
Basophils Absolute: 71 cells/uL (ref 0–200)
Basophils Relative: 1 %
EOS PCT: 2 %
Eosinophils Absolute: 142 cells/uL (ref 15–500)
HCT: 41 % (ref 35.0–45.0)
HEMOGLOBIN: 13.7 g/dL (ref 11.7–15.5)
LYMPHS ABS: 1917 {cells}/uL (ref 850–3900)
LYMPHS PCT: 27 %
MCH: 30.6 pg (ref 27.0–33.0)
MCHC: 33.4 g/dL (ref 32.0–36.0)
MCV: 91.5 fL (ref 80.0–100.0)
MONOS PCT: 5 %
MPV: 10.3 fL (ref 7.5–12.5)
Monocytes Absolute: 355 cells/uL (ref 200–950)
NEUTROS PCT: 65 %
Neutro Abs: 4615 cells/uL (ref 1500–7800)
PLATELETS: 253 10*3/uL (ref 140–400)
RBC: 4.48 MIL/uL (ref 3.80–5.10)
RDW: 13.3 % (ref 11.0–15.0)
WBC: 7.1 10*3/uL (ref 3.8–10.8)

## 2017-01-10 NOTE — Patient Instructions (Signed)

## 2017-01-10 NOTE — Progress Notes (Signed)
This very nice 55 y.o. DWF presents for 3 month follow up with Hypertension, Hyperlipidemia, Pre-Diabetes and Vitamin D Deficiency.      Patient is treated for HTN (2007) & BP has been controlled at home. Today's BP is at 134/76. Patient has had no complaints of any cardiac type chest pain, palpitations, dyspnea/orthopnea/PND, dizziness, claudication, or dependent edema.     Hyperlipidemia is controlled with diet & meds. Patient denies myalgias or other med SE's. Last Lipids were at goal: Lab Results  Component Value Date   CHOL 146 10/10/2016   HDL 44 (L) 10/10/2016   LDLCALC 74 10/10/2016   TRIG 138 10/10/2016   CHOLHDL 3.3 10/10/2016      Also, the patient has history of  Morbid Obesity (BMI 34+) and has lost 14# over the last 3 months with improved die .  She does have PreDiabetes (A1c 5.8%/Insulin 37 in 2012) and has had no symptoms of reactive hypoglycemia, diabetic polys, paresthesias or visual blurring.  Last A1c was at goal: Lab Results  Component Value Date   HGBA1C 5.2 10/10/2016      Further, the patient also has history of Vitamin D Deficiency ("22" in 2008)  and supplements vitamin D without any suspected side-effects. Last vitamin D was at goal: Lab Results  Component Value Date   VD25OH 92 10/10/2016   Current Outpatient Prescriptions on File Prior to Visit  Medication Sig  . albuterol (PROVENTIL HFA;VENTOLIN HFA) 108 (90 BASE) MCG/ACT inhaler Inhale into the lungs every 6 (six) hours as needed for wheezing or shortness of breath.  Marland Kitchen aspirin 81 MG Take 81 mg by mouth daily.  Marland Kitchen VITAMIN D  Take 5,000 Units by mouth. Alternates 5000 and 10000 units  . glucosamine-chondroitin  Take 1 tablet by mouth daily.  Marland Kitchen levocetirizin 5 MG   . montelukast  10 MG  Take 10 mg by mouth at bedtime.  . Multi-Vit w/Minerals  Take by mouth daily.  . Bone strenght vitamin with  Calcium and Magnesium  . pantoprazole  40 MG tablet   . pravastatin40 MG tablet TAKE ONE TABLET BY MOUTH AT  BEDTIME FOR CHOLESTEROL  . ASTELIN nasal spray 1 spray into nostrils 2 x dailyd   Allergies  Allergen Reactions  . Ace Inhibitors     cough  . Azithromycin     rash  . Prednisone     High dose  . Vicodin [Hydrocodone-Acetaminophen]     Nausea/vomiting   PMHx:   Past Medical History:  Diagnosis Date  . Allergy   . Asthma   . Hyperlipidemia   . Hypertension   . Migraine   . Obesity   . Prediabetes   . Vitamin D deficiency    Immunization History  Administered Date(s) Administered  . PPD Test 05/12/2014, 05/25/2015, 07/05/2016  . Pneumococcal-Unspecified 08/29/2009  . Tdap 08/30/2007   Past Surgical History:  Procedure Laterality Date  . DILATION AND CURETTAGE, DIAGNOSTIC / THERAPEUTIC  2009  . LYMPH NODE BIOPSY Left 1991   negative  . NEVUS EXCISION  2012   dysplastic from back   FHx:    Reviewed / unchanged  SHx:    Reviewed / unchanged  Systems Review:  Constitutional: Denies fever, chills, wt changes, headaches, insomnia, fatigue, night sweats, change in appetite. Eyes: Denies redness, blurred vision, diplopia, discharge, itchy, watery eyes.  ENT: Denies discharge, congestion, post nasal drip, epistaxis, sore throat, earache, hearing loss, dental pain, tinnitus, vertigo, sinus pain, snoring.  CV: Denies chest pain, palpitations, irregular heartbeat, syncope, dyspnea, diaphoresis, orthopnea, PND, claudication or edema. Respiratory: denies cough, dyspnea, DOE, pleurisy, hoarseness, laryngitis, wheezing.  Gastrointestinal: Denies dysphagia, odynophagia, heartburn, reflux, water brash, abdominal pain or cramps, nausea, vomiting, bloating, diarrhea, constipation, hematemesis, melena, hematochezia  or hemorrhoids. Genitourinary: Denies dysuria, frequency, urgency, nocturia, hesitancy, discharge, hematuria or flank pain. Musculoskeletal: Denies arthralgias, myalgias, stiffness, jt. swelling, pain, limping or strain/sprain.  Skin: Denies pruritus, rash, hives, warts,  acne, eczema or change in skin lesion(s). Neuro: No weakness, tremor, incoordination, spasms, paresthesia or pain. Psychiatric: Denies confusion, memory loss or sensory loss. Endo: Denies change in weight, skin or hair change.  Heme/Lymph: No excessive bleeding, bruising or enlarged lymph nodes.  Physical Exam  BP 134/76   Pulse 80   Temp 97.3 F (36.3 C)   Resp 16   Ht 5\' 4"  (1.626 m)   Wt 198 lb 12.8 oz (90.2 kg)   BMI 34.12 kg/m   Appears well nourished, well groomed  and in no distress.  Eyes: PERRLA, EOMs, conjunctiva no swelling or erythema. Sinuses: No frontal/maxillary tenderness ENT/Mouth: EAC's clear, TM's nl w/o erythema, bulging. Nares clear w/o erythema, swelling, exudates. Oropharynx clear without erythema or exudates. Oral hygiene is good. Tongue normal, non obstructing. Hearing intact.  Neck: Supple. Thyroid nl. Car 2+/2+ without bruits, nodes or JVD. Chest: Respirations nl with BS clear & equal w/o rales, rhonchi, wheezing or stridor.  Cor: Heart sounds normal w/ regular rate and rhythm without sig. murmurs, gallops, clicks or rubs. Peripheral pulses normal and equal  without edema.  Abdomen: Soft & bowel sounds normal. Non-tender w/o guarding, rebound, hernias, masses or organomegaly.  Lymphatics: Unremarkable.  Musculoskeletal: Full ROM all peripheral extremities, joint stability, 5/5 strength and normal gait.  Skin: Warm, dry without exposed rashes, lesions or ecchymosis apparent.  Neuro: Cranial nerves intact, reflexes equal bilaterally. Sensory-motor testing grossly intact. Tendon reflexes grossly intact.  Pysch: Alert & oriented x 3.  Insight and judgement nl & appropriate. No ideations.  Assessment and Plan:  1. Essential hypertension  - Continue medication, monitor blood pressure at home.  - Continue DASH diet. Reminder to go to the ER if any CP,  SOB, nausea, dizziness, severe HA, changes vision/speech,  left arm numbness and tingling and jaw  pain.  - CBC with Differential/Platelet - BASIC METABOLIC PANEL WITH GFR - Magnesium - TSH  2. Hyperlipidemia, mixed  - Continue diet/meds, exercise,& lifestyle modifications.  - Continue monitor periodic cholesterol/liver & renal functions   - Hepatic function panel - Lipid panel - TSH  3. Prediabetes  - Continue diet, exercise, lifestyle modifications.  - Monitor appropriate labs.  - Hemoglobin A1c - Insulin, random  4. Vitamin D deficiency  - Continue supplementation.  - VITAMIN D 25 Hydroxy   5. Gastroesophageal reflux disease   6. Medication management  - CBC with Differential/Platelet - BASIC METABOLIC PANEL WITH GFR - Hepatic function panel - Magnesium - Lipid panel - TSH - Hemoglobin A1c - Insulin, random - VITAMIN D 25 Hydroxy        Discussed  regular exercise, BP monitoring, weight control to achieve/maintain BMI less than 25 and discussed med and SE's. Recommended labs to assess and monitor clinical status with further disposition pending results of labs. Over 30 minutes of exam, counseling, chart review was performed.

## 2017-01-11 LAB — HEPATIC FUNCTION PANEL
ALT: 27 U/L (ref 6–29)
AST: 22 U/L (ref 10–35)
Albumin: 4.6 g/dL (ref 3.6–5.1)
Alkaline Phosphatase: 80 U/L (ref 33–130)
BILIRUBIN DIRECT: 0.1 mg/dL (ref <=0.2)
BILIRUBIN INDIRECT: 0.3 mg/dL (ref 0.2–1.2)
Total Bilirubin: 0.4 mg/dL (ref 0.2–1.2)
Total Protein: 6.7 g/dL (ref 6.1–8.1)

## 2017-01-11 LAB — INSULIN, RANDOM: Insulin: 19.5 u[IU]/mL (ref 2.0–19.6)

## 2017-01-11 LAB — BASIC METABOLIC PANEL WITH GFR
BUN: 16 mg/dL (ref 7–25)
CALCIUM: 9.7 mg/dL (ref 8.6–10.4)
CO2: 22 mmol/L (ref 20–31)
CREATININE: 0.88 mg/dL (ref 0.50–1.05)
Chloride: 106 mmol/L (ref 98–110)
GFR, EST AFRICAN AMERICAN: 84 mL/min (ref >=60)
GFR, EST NON AFRICAN AMERICAN: 73 mL/min (ref >=60)
Glucose, Bld: 92 mg/dL (ref 65–99)
Potassium: 4.3 mmol/L (ref 3.5–5.3)
SODIUM: 141 mmol/L (ref 135–146)

## 2017-01-11 LAB — HEMOGLOBIN A1C
Hgb A1c MFr Bld: 5.2 % (ref <5.7)
Mean Plasma Glucose: 103 mg/dL

## 2017-01-11 LAB — LIPID PANEL
CHOL/HDL RATIO: 3.7 ratio (ref <5.0)
CHOLESTEROL: 153 mg/dL (ref <200)
HDL: 41 mg/dL — AB (ref >50)
LDL Cholesterol: 78 mg/dL (ref <100)
Triglycerides: 170 mg/dL — ABNORMAL HIGH (ref <150)
VLDL: 34 mg/dL — ABNORMAL HIGH (ref <30)

## 2017-01-11 LAB — VITAMIN D 25 HYDROXY (VIT D DEFICIENCY, FRACTURES): VIT D 25 HYDROXY: 103 ng/mL — AB (ref 30–100)

## 2017-01-11 LAB — MAGNESIUM: MAGNESIUM: 2.2 mg/dL (ref 1.5–2.5)

## 2017-01-11 LAB — TSH: TSH: 3.04 mIU/L

## 2017-01-13 ENCOUNTER — Encounter: Payer: Self-pay | Admitting: Internal Medicine

## 2017-02-28 ENCOUNTER — Other Ambulatory Visit: Payer: Self-pay | Admitting: Internal Medicine

## 2017-02-28 DIAGNOSIS — Z1231 Encounter for screening mammogram for malignant neoplasm of breast: Secondary | ICD-10-CM

## 2017-03-26 ENCOUNTER — Ambulatory Visit
Admission: RE | Admit: 2017-03-26 | Discharge: 2017-03-26 | Disposition: A | Discharge status: Home / Self Care | Payer: BLUE CROSS/BLUE SHIELD | Source: Ambulatory Visit | Attending: Internal Medicine | Admitting: Internal Medicine

## 2017-03-26 DIAGNOSIS — Z1231 Encounter for screening mammogram for malignant neoplasm of breast: Secondary | ICD-10-CM

## 2017-04-03 ENCOUNTER — Other Ambulatory Visit: Payer: Self-pay | Admitting: Internal Medicine

## 2017-04-17 ENCOUNTER — Other Ambulatory Visit: Payer: Self-pay | Admitting: Physician Assistant

## 2017-04-24 NOTE — Progress Notes (Signed)
Patient ID: Rhonda [name], female   DOB: 1961/05/09, 56 y.o.   MRN: [id number]  Assessment and Plan:  Hypertension:  -Continue medication,  -monitor blood pressure at home.  -Continue DASH diet.   -Reminder to go to the ER if any CP, SOB, nausea, dizziness, severe HA, changes vision/speech, left arm numbness and tingling, and jaw pain.  Cholesterol: -Continue diet and exercise.  -Check cholesterol.   Pre-diabetes: -Continue diet and exercise.  -Check A1C  Vitamin D Def: -check level -continue medications.   Morbid Obesity with co morbidities - long discussion about weight loss, diet, and exercise    Continue diet and meds as discussed. Further disposition pending results of labs. Future Appointments Date Time Provider [place]  08/03/2017 9:00 AM Unk Pinto, MD [facility] None    HPI 55 y.o. female  presents for 3 month follow up with hypertension, hyperlipidemia, prediabetes and vitamin D.   Her blood pressure has been controlled at home, today their BP is BP: 130/80.   She does workout, going to gym 3 days a week. She denies chest pain, shortness of breath, dizziness.  Some stress with her mom, has dementia, [address] but may need assisted and or memory   She is on cholesterol medication and denies myalgias. Her cholesterol is at goal. The cholesterol last visit was:   Lab Results  Component Value Date   CHOL 153 01/10/2017   HDL 41 (L) 01/10/2017   LDLCALC 78 01/10/2017   TRIG 170 (H) 01/10/2017   CHOLHDL 3.7 01/10/2017    She has been working on diet and exercise for prediabetes, and denies foot ulcerations, hyperglycemia, hypoglycemia , increased appetite, nausea, paresthesia of the feet, polydipsia, polyuria, visual disturbances, vomiting and weight loss. Last A1C in the office was:  Lab Results  Component Value Date   HGBA1C 5.2 01/10/2017   Patient is on Vitamin D supplement.  Lab Results  Component Value Date   VD25OH  103 (H) 01/10/2017   BMI is Body mass index is 32.41 kg/m., she is working on diet and exercise. Wt Readings from Last 3 Encounters:  04/25/17 188 lb 12.8 oz (85.6 kg)  01/13/17 198 lb 12.8 oz (90.2 kg)  10/10/16 212 lb 12.8 oz (96.5 kg)      Current Medications:  Current Outpatient Prescriptions on File Prior to Visit  Medication Sig Dispense Refill  . albuterol (PROVENTIL HFA;VENTOLIN HFA) 108 (90 BASE) MCG/ACT inhaler Inhale into the lungs every 6 (six) hours as needed for wheezing or shortness of breath.    Marland Kitchen aspirin 81 MG tablet Take 81 mg by mouth daily.    . bisoprolol-hydrochlorothiazide (ZIAC) 5-6.25 MG tablet TAKE 1/2-1 TABLET BY MOUTH EVERY DAY FORBLOOD PRESSURE 90 tablet 0  . Cholecalciferol (VITAMIN D PO) Take 5,000 Units by mouth. Alternates 5000 and 10000 units    . glucosamine-chondroitin 500-400 MG tablet Take 1 tablet by mouth daily.    Marland Kitchen levocetirizine (XYZAL) 5 MG tablet   5  . losartan (COZAAR) 100 MG tablet TAKE ONE TABLET BY MOUTH EVERY DAY 90 tablet 0  . montelukast (SINGULAIR) 10 MG tablet Take 10 mg by mouth at bedtime.    . Multiple Vitamins-Minerals (MULTIVITAMIN PO) Take by mouth daily.    Marland Kitchen OVER THE COUNTER MEDICATION Bone strenght vitamin with Calcium and Magnesium    . pantoprazole (PROTONIX) 40 MG tablet   11  . pravastatin (PRAVACHOL) 40 MG tablet TAKE ONE TABLET BY MOUTH AT BEDTIME FOR CHOLESTEROL  90 tablet 0  . azelastine (ASTELIN) 0.1 % nasal spray Place 1 spray into both nostrils 2 (two) times daily. Use in each nostril as directed 30 mL 2   No current facility-administered medications on file prior to visit.     Medical History:  Past Medical History:  Diagnosis Date  . Allergy   . Asthma   . Hyperlipidemia   . Hypertension   . Migraine   . Obesity   . Prediabetes   . Vitamin D deficiency     Allergies:  Allergies  Allergen Reactions  . Ace Inhibitors     cough  . Azithromycin     rash  . Prednisone     High dose  . Vicodin  [Hydrocodone-Acetaminophen]     Nausea/vomiting     Review of Systems:  Review of Systems  Constitutional: Negative for chills and fever.  HENT: Negative for congestion, ear pain and sore throat.   Respiratory: Negative for cough, sputum production, shortness of breath and wheezing.   Cardiovascular: Negative for chest pain, palpitations and leg swelling.  Gastrointestinal: Negative for abdominal pain, blood in stool, constipation, diarrhea, heartburn and melena.  Genitourinary: Negative.   Skin: Negative.   Neurological: Negative for dizziness, sensory change, loss of consciousness and headaches.  Psychiatric/Behavioral: Negative for depression. The patient is not nervous/anxious and does not have insomnia.     Family history- Review and unchanged  Social history- Review and unchanged  Physical Exam: BP 130/80   Pulse 65   Temp (!) 97.3 F (36.3 C)   Resp 14   Ht 5\' 4"  (1.626 m)   Wt 188 lb 12.8 oz (85.6 kg)   SpO2 98%   BMI 32.41 kg/m  Wt Readings from Last 3 Encounters:  04/25/17 188 lb 12.8 oz (85.6 kg)  01/13/17 198 lb 12.8 oz (90.2 kg)  10/10/16 212 lb 12.8 oz (96.5 kg)     General Appearance: Well nourished well developed, in no apparent distress. Eyes: PERRLA, EOMs, conjunctiva no swelling or erythema ENT/Mouth: Ear canals normal without obstruction, swelling, erythma, discharge.  TMs normal bilaterally.  Oropharynx moist, clear, without exudate, or postoropharyngeal swelling. Neck: Supple, thyroid normal,no cervical adenopathy  Respiratory: Respiratory effort normal, Breath sounds clear A&P without rhonchi, wheeze, or rale.  No retractions, no accessory usage. Cardio: RRR with no MRGs. Brisk peripheral pulses without edema.  Abdomen: Soft, + BS,  Non tender, no guarding, rebound, hernias, masses. Musculoskeletal: Full ROM, 5/5 strength, Normal gait Skin: Warm, dry without rashes, lesions, ecchymosis.  Neuro: Awake and oriented X 3, Cranial nerves intact.  Normal muscle tone, no cerebellar symptoms. Psych: Normal affect, Insight and Judgment appropriate.    Vicie Mutters, PA-C 4:52 PM [facility]

## 2017-04-25 ENCOUNTER — Encounter: Payer: Self-pay | Admitting: Physician Assistant

## 2017-04-25 ENCOUNTER — Ambulatory Visit (INDEPENDENT_AMBULATORY_CARE_PROVIDER_SITE_OTHER): Payer: BLUE CROSS/BLUE SHIELD | Admitting: Physician Assistant

## 2017-04-25 VITALS — BP 130/80 | HR 65 | Temp 97.3°F | Resp 14 | Ht 64.0 in | Wt 188.8 lb

## 2017-04-25 DIAGNOSIS — I10 Essential (primary) hypertension: Secondary | ICD-10-CM

## 2017-04-25 DIAGNOSIS — E559 Vitamin D deficiency, unspecified: Secondary | ICD-10-CM

## 2017-04-25 DIAGNOSIS — E782 Mixed hyperlipidemia: Secondary | ICD-10-CM | POA: Diagnosis not present

## 2017-04-25 NOTE — Patient Instructions (Addendum)
Monitor BP at home especially if you have fatigue, dizziness.  Try 1/2 of the ziac daily, can try to stop the weight loss  Monitor your blood pressure at home. Go to the ER if any CP, SOB, nausea, dizziness, severe HA, changes vision/speech  Goal BP:  For patients younger than 60: Goal BP < 140/90. For patients 60 and older: Goal BP < 150/90. For patients with diabetes: Goal BP < 140/90. Your most recent BP: BP: 130/80   Take your medications faithfully as instructed. Maintain a healthy weight. Get at least 150 minutes of aerobic exercise per week. Minimize salt intake. Minimize alcohol intake  DASH Eating Plan DASH stands for "Dietary Approaches to Stop Hypertension." The DASH eating plan is a healthy eating plan that has been shown to reduce high blood pressure (hypertension). Additional health benefits may include reducing the risk of type 2 diabetes mellitus, heart disease, and stroke. The DASH eating plan may also help with weight loss. WHAT DO I NEED TO KNOW ABOUT THE DASH EATING PLAN? For the DASH eating plan, you will follow these general guidelines:  Choose foods with a percent daily value for sodium of less than 5% (as listed on the food label).  Use salt-free seasonings or herbs instead of table salt or sea salt.  Check with your health care provider or pharmacist before using salt substitutes.  Eat lower-sodium products, often labeled as "lower sodium" or "no salt added."  Eat fresh foods.  Eat more vegetables, fruits, and low-fat dairy products.  Choose whole grains. Look for the word "whole" as the first word in the ingredient list.  Choose fish and skinless chicken or [place] more often than red meat. Limit fish, poultry, and meat to 6 oz (170 g) each day.  Limit sweets, desserts, sugars, and sugary drinks.  Choose heart-healthy fats.  Limit cheese to 1 oz (28 g) per day.  Eat more home-cooked food and less restaurant, buffet, and fast food.  Limit fried  foods.  Cook foods using methods other than frying.  Limit canned vegetables. If you do use them, rinse them well to decrease the sodium.  When eating at a restaurant, ask that your food be prepared with less salt, or no salt if possible. WHAT FOODS CAN I EAT? Seek help from a dietitian for individual calorie needs. Grains Whole grain or whole wheat bread. Brown rice. Whole grain or whole wheat pasta. Quinoa, bulgur, and whole grain cereals. Low-sodium cereals. Corn or whole wheat flour tortillas. Whole grain cornbread. Whole grain crackers. Low-sodium crackers. Vegetables Fresh or frozen vegetables (raw, steamed, roasted, or grilled). Low-sodium or reduced-sodium tomato and vegetable juices. Low-sodium or reduced-sodium tomato sauce and paste. Low-sodium or reduced-sodium canned vegetables.  Fruits All fresh, canned (in natural juice), or frozen fruits. Meat and Other Protein Products Ground beef (85% or leaner), grass-fed beef, or beef trimmed of fat. Skinless chicken or [place]. Ground chicken or [place]. Pork trimmed of fat. All fish and seafood. Eggs. Dried beans, peas, or lentils. Unsalted nuts and seeds. Unsalted canned beans. Dairy Low-fat dairy products, such as skim or 1% milk, 2% or reduced-fat cheeses, low-fat ricotta or cottage cheese, or plain low-fat yogurt. Low-sodium or reduced-sodium cheeses. Fats and Oils Tub margarines without trans fats. Light or reduced-fat mayonnaise and salad dressings (reduced sodium). Avocado. Safflower, olive, or canola oils. Natural peanut or almond butter. Other Unsalted popcorn and pretzels. The items listed above may not be a complete list of recommended foods or beverages. Contact your  dietitian for more options. WHAT FOODS ARE NOT RECOMMENDED? Grains White bread. White pasta. White rice. Refined cornbread. Bagels and croissants. Crackers that contain trans fat. Vegetables Creamed or fried vegetables. Vegetables in a cheese sauce. Regular  canned vegetables. Regular canned tomato sauce and paste. Regular tomato and vegetable juices. Fruits Dried fruits. Canned fruit in light or heavy syrup. Fruit juice. Meat and Other Protein Products Fatty cuts of meat. Ribs, chicken wings, bacon, sausage, bologna, salami, chitterlings, fatback, hot dogs, bratwurst, and packaged luncheon meats. Salted nuts and seeds. Canned beans with salt. Dairy Whole or 2% milk, cream, half-and-half, and cream cheese. Whole-fat or sweetened yogurt. Full-fat cheeses or blue cheese. Nondairy creamers and whipped toppings. Processed cheese, cheese spreads, or cheese curds. Condiments Onion and garlic salt, seasoned salt, table salt, and sea salt. Canned and packaged gravies. Worcestershire sauce. Tartar sauce. Barbecue sauce. Teriyaki sauce. Soy sauce, including reduced sodium. Steak sauce. Fish sauce. Oyster sauce. Cocktail sauce. Horseradish. Ketchup and mustard. Meat flavorings and tenderizers. Bouillon cubes. Hot sauce. Tabasco sauce. Marinades. Taco seasonings. Relishes. Fats and Oils Butter, stick margarine, lard, shortening, ghee, and bacon fat. Coconut, palm kernel, or palm oils. Regular salad dressings. Other Pickles and olives. Salted popcorn and pretzels. The items listed above may not be a complete list of foods and beverages to avoid. Contact your dietitian for more information. WHERE CAN I FIND MORE INFORMATION? [employer], [employer], [employer]: travelstabl[employer].com Document Released: 07/13/2011 Document Revised: 12/08/2013 Document Reviewed: 05/28/2013 [facility] Patient Information 2015 [place], [place]. This information is not intended to replace advice given to you by your health care provider. Make sure you discuss any questions you have with your health care provider.  You Will Hit a plateau-And You Can Bust Through It As you slim down, your body releases much less leptin, the fullness hormone.  If you're not  strength training, start right now. Building muscle can raise your metabolism to help you overcome a plateau. To keep your body challenged and burning calories, incorporate new moves and more intense intervals into your workouts or add another sweat session to your weekly routine. Alternatively, cut an extra 100 calories or so a day from your diet. Now that you've lost weight, your body simply doesn't need as much fuel.   Ways to cut 100 calories  1. Eat your eggs with hot sauce OR salsa instead of cheese.  Eggs are great for breakfast, but many people consider eggs and cheese to be BFFs. Instead of cheese-1 oz. of cheddar has 114 calories-top your eggs with hot sauce, which contains no calories and helps with satiety and metabolism. Salsa is also a great option!!  2. Top your toast, waffles or pancakes with mashed berries instead of jelly or syrup. Half a cup of berries-fresh, frozen or thawed-has about 40 calories, compared with 2 tbsp. of maple syrup or jelly, which both have about 100 calories. The berries will also give you a good punch of fiber, which helps keep you full and satisfied and won't spike blood sugar quickly like the jelly or syrup. 3. Swap the non-fat latte for black coffee with a splash of half-and-half. Contrary to its name, that non-fat latte has 130 calories and a startling 19g of carbohydrates per 16 oz. serving. Replacing that 'light' drinkable dessert with a black coffee with a splash of half-and-half saves you more than 100 calories per 16 oz. serving. 4. Sprinkle salads with freeze-dried raspberries instead of dried cranberries. If you want a sweet addition to  your nutritious salad, stay away from dried cranberries. They have a whopping 130 calories per  cup and 30g carbohydrates. Instead, sprinkle freeze-dried raspberries guilt-free and save more than 100 calories per  cup serving, adding 3g of belly-filling fiber. 5. Go for mustard in place of mayo on your  sandwich. Mustard can add really nice flavor to any sandwich, and there are tons of varieties, from spicy to honey. A serving of mayo is 95 calories, versus 10 calories in a serving of mustard. 6. Choose a DIY salad dressing instead of the store-bought kind. Mix Dijon or whole grain mustard with low-fat Kefir or red wine vinegar and garlic. 7. Use hummus as a spread instead of a dip. Use hummus as a spread on a high-fiber cracker or tortilla with a sandwich and save on calories without sacrificing taste. 8. Pick just one salad "accessory." Salad isn't automatically a calorie winner. It's easy to over-accessorize with toppings. Instead of topping your salad with nuts, avocado and cranberries (all three will clock in at 313 calories), just pick one. The next day, choose a different accessory, which will also keep your salad interesting. You don't wear all your jewelry every day, right? 9. Ditch the white pasta in favor of spaghetti squash. One cup of cooked spaghetti squash has about 40 calories, compared with traditional spaghetti, which comes with more than 200. Spaghetti squash is also nutrient-dense. It's a good source of fiber and Vitamins A and C, and it can be eaten just like you would eat pasta-with a great tomato sauce and [place] meatballs or with pesto, tofu and spinach, for example. 10. Dress up your chili, soups and stews with non-fat [place] yogurt instead of sour cream. Just a 'dollop' of sour cream can set you back 115 calories and a whopping 12g of fat-seven of which are of the artery-clogging variety. Added bonus: [place] yogurt is packed with muscle-building protein, calcium and B Vitamins. 11. Mash cauliflower instead of mashed potatoes. One cup of traditional mashed potatoes-in all their creamy goodness-has more than 200 calories, compared to mashed cauliflower, which you can typically eat for less than 100 calories per 1 cup serving. Cauliflower is a great source of the antioxidant  indole-3-carbinol (I3C), which may help reduce the risk of some cancers, like breast cancer. 12. Ditch the ice cream sundae in favor of a [place] yogurt parfait. Instead of a cup of ice cream or fro-yo for dessert, try 1 cup of nonfat Greek yogurt topped with fresh berries and a sprinkle of cacao nibs. Both toppings are packed with antioxidants, which can help reduce cellular inflammation and oxidative damage. And the comparison is a no-brainer: One cup of ice cream has about 275 calories; one cup of frozen yogurt has about 230; and a cup of Greek yogurt has just 130, plus twice the protein, so you're less likely to return to the freezer for a second helping. 13. Put olive oil in a spray container instead of using it directly from the bottle. Each tablespoon of olive oil is 120 calories and 15g of fat. Use a mister instead of pouring it straight into the pan or onto a salad. This allows for portion control and will save you more than 100 calories. 14. When baking, substitute canned pumpkin for butter or oil. Canned pumpkin-not pumpkin pie mix-is loaded with Vitamin A, which is important for skin and eye health, as well as immunity. And the comparisons are pretty crazy:  cup of canned pumpkin has about 40 calories,  compared to butter or oil, which has more than 800 calories. Yes, 800 calories. Applesauce and mashed banana can also serve as good substitutions for butter or oil, usually in a 1:1 ratio. 15. Top casseroles with high-fiber cereal instead of breadcrumbs. Breadcrumbs are typically made with white bread, while breakfast cereals contain 5-9g of fiber per serving. Not only will you save more than 150 calories per  cup serving, the swap will also keep you more full and you'll get a metabolism boost from the added fiber. 16. Snack on pistachios instead of macadamia nuts. Believe it or not, you get the same amount of calories from 35 pistachios (100 calories) as you would from only five macadamia  nuts. 17. Chow down on kale chips rather than potato chips. This is my favorite 'don't knock it 'till you try it' swap. Kale chips are so easy to make at home, and you can spice them up with a little grated parmesan or chili powder. Plus, they're a mere fraction of the calories of potato chips, but with the same crunch factor we crave so often. 18. Add seltzer and some fruit slices to your cocktail instead of soda or fruit juice. One cup of soda or fruit juice can pack on as much as 140 calories. Instead, use seltzer and fruit slices. The fruit provides valuable phytochemicals, such as flavonoids and anthocyanins, which help to combat cancer and stave off the aging process.

## 2017-04-26 ENCOUNTER — Telehealth: Payer: Self-pay

## 2017-04-26 LAB — CBC WITH DIFFERENTIAL/PLATELET
BASOS ABS: 68 {cells}/uL (ref 0–200)
Basophils Relative: 0.9 %
EOS PCT: 2.5 %
Eosinophils Absolute: 188 cells/uL (ref 15–500)
HCT: 39.5 % (ref 35.0–45.0)
HEMOGLOBIN: 13.6 g/dL (ref 11.7–15.5)
Lymphs Abs: 2108 cells/uL (ref 850–3900)
MCH: 30.8 pg (ref 27.0–33.0)
MCHC: 34.4 g/dL (ref 32.0–36.0)
MCV: 89.6 fL (ref 80.0–100.0)
MONOS PCT: 6.7 %
MPV: 11.2 fL (ref 7.5–12.5)
NEUTROS ABS: 4635 {cells}/uL (ref 1500–7800)
Neutrophils Relative %: 61.8 %
PLATELETS: 232 10*3/uL (ref 140–400)
RBC: 4.41 10*6/uL (ref 3.80–5.10)
RDW: 12.3 % (ref 11.0–15.0)
TOTAL LYMPHOCYTE: 28.1 %
WBC mixed population: 503 cells/uL (ref 200–950)
WBC: 7.5 10*3/uL (ref 3.8–10.8)

## 2017-04-26 LAB — TSH: TSH: 2.54 mIU/L (ref 0.40–4.50)

## 2017-04-26 LAB — HEPATIC FUNCTION PANEL
AG Ratio: 2.4 (calc) (ref 1.0–2.5)
ALKALINE PHOSPHATASE (APISO): 86 U/L (ref 33–130)
ALT: 19 U/L (ref 6–29)
AST: 16 U/L (ref 10–35)
Albumin: 4.6 g/dL (ref 3.6–5.1)
BILIRUBIN INDIRECT: 0.3 mg/dL (ref 0.2–1.2)
Bilirubin, Direct: 0.1 mg/dL (ref 0.0–0.2)
Globulin: 1.9 g/dL (calc) (ref 1.9–3.7)
TOTAL PROTEIN: 6.5 g/dL (ref 6.1–8.1)
Total Bilirubin: 0.4 mg/dL (ref 0.2–1.2)

## 2017-04-26 LAB — BASIC METABOLIC PANEL WITH GFR
BUN: 23 mg/dL (ref 7–25)
CHLORIDE: 103 mmol/L (ref 98–110)
CO2: 29 mmol/L (ref 20–32)
CREATININE: 1.02 mg/dL (ref 0.50–1.05)
Calcium: 9.8 mg/dL (ref 8.6–10.4)
GFR, Est African American: 71 mL/min/{1.73_m2} (ref >=60)
GFR, Est Non African American: 61 mL/min/{1.73_m2} (ref >=60)
GLUCOSE: 96 mg/dL (ref 65–99)
POTASSIUM: 4.1 mmol/L (ref 3.5–5.3)
SODIUM: 139 mmol/L (ref 135–146)

## 2017-04-26 LAB — LIPID PANEL
Cholesterol: 165 mg/dL (ref <200)
HDL: 41 mg/dL — AB (ref >50)
LDL Cholesterol (Calc): 90 mg/dL (calc)
Non-HDL Cholesterol (Calc): 124 mg/dL (calc) (ref <130)
Total CHOL/HDL Ratio: 4 (calc) (ref <5.0)
Triglycerides: 246 mg/dL — ABNORMAL HIGH (ref <150)

## 2017-04-26 LAB — VITAMIN D 25 HYDROXY (VIT D DEFICIENCY, FRACTURES): Vit D, 25-Hydroxy: 84 ng/mL (ref 30–100)

## 2017-04-26 NOTE — Telephone Encounter (Signed)
Pt reports she isn't eating a lot of greasy foods, white flour. What could be causing her triglycerides to be elevated & she should keep taking her chol meds?   Per provider she should cont to take her cholesterol meds for now & her trigs are likely elevated because she was not fasting so they are okay.  Pt seemed happy with the information & hung up.

## 2017-04-26 NOTE — Progress Notes (Signed)
Pt aware of lab results & voiced understanding of those results.

## 2017-05-02 DIAGNOSIS — J301 Allergic rhinitis due to pollen: Secondary | ICD-10-CM | POA: Diagnosis not present

## 2017-05-02 DIAGNOSIS — J069 Acute upper respiratory infection, unspecified: Secondary | ICD-10-CM | POA: Diagnosis not present

## 2017-05-02 DIAGNOSIS — J4530 Mild persistent asthma, uncomplicated: Secondary | ICD-10-CM | POA: Diagnosis not present

## 2017-05-02 DIAGNOSIS — J3089 Other allergic rhinitis: Secondary | ICD-10-CM | POA: Diagnosis not present

## 2017-06-04 DIAGNOSIS — B308 Other viral conjunctivitis: Secondary | ICD-10-CM | POA: Diagnosis not present

## 2017-06-18 ENCOUNTER — Encounter: Payer: Self-pay | Admitting: Physician Assistant

## 2017-06-18 ENCOUNTER — Other Ambulatory Visit: Payer: Self-pay | Admitting: Physician Assistant

## 2017-06-18 ENCOUNTER — Ambulatory Visit: Payer: BLUE CROSS/BLUE SHIELD | Admitting: Physician Assistant

## 2017-06-18 VITALS — BP 122/88 | HR 76 | Temp 97.3°F | Resp 14 | Ht 64.0 in | Wt 182.2 lb

## 2017-06-18 DIAGNOSIS — M542 Cervicalgia: Secondary | ICD-10-CM

## 2017-06-18 MED ORDER — MELOXICAM 15 MG PO TABS: ORAL_TABLET | ORAL | 1 refills | Status: DC

## 2017-06-18 MED ORDER — CYCLOBENZAPRINE HCL 10 MG PO TABS: 10.0000 mg | ORAL_TABLET | Freq: Three times a day (TID) | ORAL | 0 refills | Status: DC | PRN

## 2017-06-18 NOTE — Patient Instructions (Signed)
Try the exercises and other information below, meloxicam once during the day as needed (avoid taking other NSAIDS like Alleve or Ibuprofen while taking this) and then flexeril if needed at bedtime for muscle spasm. This can be taken up to every 8 hours, but causes sedation, so should not drive or operate heavy machinery while taking this medicine.   Go to the ER if you have any new weakness in your arms, trouble with your grip, worse headache ever, fever, chills. or have worsening pain.   If you are not better in 1-3 month we will refer you to ortho   Cervical Sprain A cervical sprain is a stretch or tear in one or more of the tough, cord-like tissues that connect bones (ligaments) in the neck. Cervical sprains can range from mild to severe. Severe cervical sprains can cause the spinal bones (vertebrae) in the neck to be unstable. This can lead to spinal cord damage and can result in serious nervous system problems. The amount of time that it takes for a cervical sprain to get better depends on the cause and extent of the injury. Most cervical sprains heal in 4-6 weeks. What are the causes? Cervical sprains may be caused by an injury (trauma), such as from a motor vehicle accident, a fall, or sudden forward and backward whipping movement of the head and neck (whiplash injury). Mild cervical sprains may be caused by wear and tear over time, such as from poor posture, sitting in a chair that does not provide support, or looking up or down for long periods of time. What increases the risk? The following factors may make you more likely to develop this condition:  Participating in activities that have a high risk of trauma to the neck. These include contact sports, auto racing, gymnastics, and diving.  Taking risks when driving or riding in a motor vehicle, such as speeding.  Having osteoarthritis of the spine.  Having poor strength and flexibility of the neck.  A previous neck injury.  Having  poor posture.  Spending a lot of time in certain positions that put stress on the neck, such as sitting at a computer for long periods of time.  What are the signs or symptoms? Symptoms of this condition include:  Pain, soreness, stiffness, tenderness, swelling, or a burning sensation in the front, back, or sides of the neck.  Sudden tightening of neck muscles that you cannot control (muscle spasms).  Pain in the shoulders or upper back.  Limited ability to move the neck.  Headache.  Dizziness.  Nausea.  Vomiting.  Weakness, numbness, or tingling in a hand or an arm.  Symptoms may develop right away after injury, or they may develop over a few days. In some cases, symptoms may go away with treatment and return (recur) over time. How is this diagnosed? This condition may be diagnosed based on:  Your medical history.  Your symptoms.  Any recent injuries or known neck problems that you have, such as arthritis in the neck.  A physical exam.  Imaging tests, such as: ? X-rays. ? MRI. ? CT scan.  How is this treated? This condition is treated by resting and icing the injured area and doing physical therapy exercises. Depending on the severity of your condition, treatment may also include:  Keeping your neck in place (immobilized) for periods of time. This may be done using: ? A cervical collar. This supports your chin and the back of your head. ? A cervical traction device.  This is a sling that holds up your head. This removes weight and pressure from your neck, and it may help to relieve pain.  Medicines that help to relieve pain and inflammation.  Medicines that help to relax your muscles (muscle relaxants).  Surgery. This is rare.  Follow these instructions at home: If you have a cervical collar:  Wear it as told by your health care provider. Do not remove the collar unless instructed by your health care provider.  Ask your health care provider before you make  any adjustments to your collar.  If you have long hair, keep it outside of the collar.  Ask your health care provider if you can remove the collar for cleaning and bathing. If you are allowed to remove the collar for cleaning or bathing: ? Follow instructions from your health care provider about how to remove the collar safely. ? Clean the collar by wiping it with mild soap and water and drying it completely. ? If your collar has removable pads, remove them every 1-2 days and wash them by hand with soap and water. Let them air-dry completely before you put them back in the collar. ? Check your skin under the collar for irritation or sores. If you see any, tell your health care provider. Managing pain, stiffness, and swelling  If directed, use a cervical traction device as told by your health care provider.  If directed, apply heat to the affected area before you do your physical therapy or as often as told by your health care provider. Use the heat source that your health care provider recommends, such as a moist heat pack or a heating pad. ? Place a towel between your skin and the heat source. ? Leave the heat on for 20-30 minutes. ? Remove the heat if your skin turns bright red. This is especially important if you are unable to feel pain, heat, or cold. You may have a greater risk of getting burned.  If directed, put ice on the affected area: ? Put ice in a plastic bag. ? Place a towel between your skin and the bag. ? Leave the ice on for 20 minutes, 2-3 times a day. Activity  Do not drive while wearing a cervical collar. If you do not have a cervical collar, ask your health care provider if it is safe to drive while your neck heals.  Do not drive or use heavy machinery while taking prescription pain medicine or muscle relaxants, unless your health care provider approves.  Do not lift anything that is heavier than 10 lb (4.5 kg) until your health care provider tells you that it is  safe.  Rest as directed by your health care provider. Avoid positions and activities that make your symptoms worse. Ask your health care provider what activities are safe for you.  If physical therapy was prescribed, do exercises as told by your health care provider or physical therapist. General instructions  Take over-the-counter and prescription medicines only as told by your health care provider.  Do not use any products that contain nicotine or tobacco, such as cigarettes and e-cigarettes. These can delay healing. If you need help quitting, ask your health care provider.  Keep all follow-up visits as told by your health care provider or physical therapist. This is important. How is this prevented? To prevent a cervical sprain from happening again:  Use and maintain good posture. Make any needed adjustments to your workstation to help you use good posture.  Exercise  regularly as directed by your health care provider or physical therapist.  Avoid risky activities that may cause a cervical sprain.  Contact a health care provider if:  You have symptoms that get worse or do not get better after 2 weeks of treatment.  You have pain that gets worse or does not get better with medicine.  You develop new, unexplained symptoms.  You have sores or irritated skin on your neck from wearing your cervical collar. Get help right away if:  You have severe pain.  You develop numbness, tingling, or weakness in any part of your body.  You cannot move a part of your body (you have paralysis).  You have neck pain along with: ? Severe dizziness. ? Headache. Summary  A cervical sprain is a stretch or tear in one or more of the tough, cord-like tissues that connect bones (ligaments) in the neck.  Cervical sprains may be caused by an injury (trauma), such as from a motor vehicle accident, a fall, or sudden forward and backward whipping movement of the head and neck (whiplash  injury).  Symptoms may develop right away after injury, or they may develop over a few days.  This condition is treated by resting and icing the injured area and doing physical therapy exercises. This information is not intended to replace advice given to you by your health care provider. Make sure you discuss any questions you have with your health care provider. Document Released: 05/21/2007 Document Revised: 03/22/2016 Document Reviewed: 03/22/2016 Elsevier Interactive Patient Education  2017 [employer]   Cervical Strain and Sprain Rehab Ask your health care provider which exercises are safe for you. Do exercises exactly as told by your health care provider and adjust them as directed. It is normal to feel mild stretching, pulling, tightness, or discomfort as you do these exercises, but you should stop right away if you feel sudden pain or your pain gets worse.Do not begin these exercises until told by your health care provider. Stretching and range of motion exercises These exercises warm up your muscles and joints and improve the movement and flexibility of your neck. These exercises also help to relieve pain, numbness, and tingling. Exercise A: Cervical side bend  1. Using good posture, sit on a stable chair or stand up. 2. Without moving your shoulders, slowly tilt your left / right ear to your shoulder until you feel a stretch in your neck muscles. You should be looking straight ahead. 3. Hold for __________ seconds. 4. Repeat with the other side of your neck. Repeat __________ times. Complete this exercise __________ times a day. Exercise B: Cervical rotation  1. Using good posture, sit on a stable chair or stand up. 2. Slowly turn your head to the side as if you are looking over your left / right shoulder. ? Keep your eyes level with the ground. ? Stop when you feel a stretch along the side and the back of your neck. 3. Hold for __________ seconds. 4. Repeat this by turning  to your other side. Repeat __________ times. Complete this exercise __________ times a day. Exercise C: Thoracic extension and pectoral stretch 1. Roll a towel or a small blanket so it is about 4 inches (10 cm) in diameter. 2. Lie down on your back on a firm surface. 3. Put the towel lengthwise, under your spine in the middle of your back. It should not be not under your shoulder blades. The towel should line up with your spine from your  middle back to your lower back. 4. Put your hands behind your head and let your elbows fall out to your sides. 5. Hold for __________ seconds. Repeat __________ times. Complete this exercise __________ times a day. Strengthening exercises These exercises build strength and endurance in your neck. Endurance is the ability to use your muscles for a long time, even after your muscles get tired. Exercise D: Upper cervical flexion, isometric 1. Lie on your back with a thin pillow behind your head and a small rolled-up towel under your neck. 2. Gently tuck your chin toward your chest and nod your head down to look toward your feet. Do not lift your head off the pillow. 3. Hold for __________ seconds. 4. Release the tension slowly. Relax your neck muscles completely before you repeat this exercise. Repeat __________ times. Complete this exercise __________ times a day. Exercise E: Cervical extension, isometric  1. Stand about 6 inches (15 cm) away from a wall, with your back facing the wall. 2. Place a soft object, about 6-8 inches (15-20 cm) in diameter, between the back of your head and the wall. A soft object could be a small pillow, a ball, or a folded towel. 3. Gently tilt your head back and press into the soft object. Keep your jaw and forehead relaxed. 4. Hold for __________ seconds. 5. Release the tension slowly. Relax your neck muscles completely before you repeat this exercise. Repeat __________ times. Complete this exercise __________ times a  day. Posture and body mechanics  Body mechanics refers to the movements and positions of your body while you do your daily activities. Posture is part of body mechanics. Good posture and healthy body mechanics can help to relieve stress in your body's tissues and joints. Good posture means that your spine is in its natural S-curve position (your spine is neutral), your shoulders are pulled back slightly, and your head is not tipped forward. The following are general guidelines for applying improved posture and body mechanics to your everyday activities. Standing  When standing, keep your spine neutral and keep your feet about hip-width apart. Keep a slight bend in your knees. Your ears, shoulders, and hips should line up.  When you do a task in which you stand in one place for a long time, place one foot up on a stable object that is 2-4 inches (5-10 cm) high, such as a footstool. This helps keep your spine neutral. Sitting   When sitting, keep your spine neutral and your keep feet flat on the floor. Use a footrest, if necessary, and keep your thighs parallel to the floor. Avoid rounding your shoulders, and avoid tilting your head forward.  When working at a desk or a computer, keep your desk at a height where your hands are slightly lower than your elbows. Slide your chair under your desk so you are close enough to maintain good posture.  When working at a computer, place your monitor at a height where you are looking straight ahead and you do not have to tilt your head forward or downward to look at the screen. Resting When lying down and resting, avoid positions that are most painful for you. Try to support your neck in a neutral position. You can use a contour pillow or a small rolled-up towel. Your pillow should support your neck but not push on it. This information is not intended to replace advice given to you by your health care provider. Make sure you discuss any questions you have  with  your health care provider. Document Released: 07/24/2005 Document Revised: 03/30/2016 Document Reviewed: 06/30/2015 Elsevier Interactive Patient Education  [employer].

## 2017-06-18 NOTE — Progress Notes (Signed)
Subjective:    Patient ID: Rhonda [name], female    DOB: 12/09/1960, 56 y.o.   MRN: [id number]  HPI 57 y.o. WF presents with neck stiffness x 1 week.  Has history of neck pain but woke up last Sunday with neck stiffness/pain, would ease off, and worse Wednesday then eased off, but then worse Saturday. Has had back and hip pain as well. Some sore throat x last night with cough. Some HA but no sensitivity to light/sound, no nausea. No fever, chills. No numbness, tingling in hand/arm, no weakness.   Has been doing ice/heat, she has done ibuprofen 2-3 in AM and 2 in the PM.   Blood pressure 122/88, pulse 76, temperature (!) 97.3 F (36.3 C), resp. rate 14, height 5\' 4"  (1.626 m), weight 182 lb 3.2 oz (82.6 kg), SpO2 98 %.  Medications Current Outpatient Medications on File Prior to Visit  Medication Sig  . albuterol (PROVENTIL HFA;VENTOLIN HFA) 108 (90 BASE) MCG/ACT inhaler Inhale into the lungs every 6 (six) hours as needed for wheezing or shortness of breath.  Marland Kitchen aspirin 81 MG tablet Take 81 mg by mouth daily.  . Cholecalciferol (VITAMIN D PO) Take 5,000 Units by mouth. Alternates 5000 and 10000 units  . glucosamine-chondroitin 500-400 MG tablet Take 1 tablet by mouth daily.  Marland Kitchen levocetirizine (XYZAL) 5 MG tablet   . losartan (COZAAR) 100 MG tablet TAKE ONE TABLET BY MOUTH EVERY DAY  . montelukast (SINGULAIR) 10 MG tablet Take 10 mg by mouth at bedtime.  . Multiple Vitamins-Minerals (MULTIVITAMIN PO) Take by mouth daily.  Marland Kitchen OVER THE COUNTER MEDICATION Bone strenght vitamin with Calcium and Magnesium  . pantoprazole (PROTONIX) 40 MG tablet   . pravastatin (PRAVACHOL) 40 MG tablet TAKE ONE TABLET BY MOUTH AT BEDTIME FOR CHOLESTEROL  . azelastine (ASTELIN) 0.1 % nasal spray Place 1 spray into both nostrils 2 (two) times daily. Use in each nostril as directed   No current facility-administered medications on file prior to visit.     Problem list She has Hypertension; Hyperlipidemia;  Vitamin D deficiency; Prediabetes; Asthma; Morbid obesity (BMI 36.19)     ([place]); Medication management; and BMI 36.19,  adult on their problem list.   Review of Systems  Constitutional: Negative.  Negative for chills, fatigue and fever.  HENT: Negative.  Negative for congestion, sore throat and trouble swallowing.   Respiratory: Negative.   Cardiovascular: Negative.   Genitourinary: Negative.   Musculoskeletal: Positive for neck pain and neck stiffness. Negative for arthralgias, back pain, gait problem, joint swelling and myalgias.  Skin: Negative.  Negative for rash.  Neurological: Negative.  Negative for dizziness, tremors, seizures, syncope, facial asymmetry, speech difficulty, weakness, light-headedness, numbness and headaches.  Psychiatric/Behavioral: Negative for agitation and confusion.       Objective:   Physical Exam  Constitutional: She is oriented to person, place, and time. She appears well-developed and well-nourished.  HENT:  Head: Normocephalic and atraumatic.  Eyes: Conjunctivae are normal. Pupils are equal, round, and reactive to light.  Neck: Normal range of motion. Neck supple. No Brudzinski's sign and no Kernig's sign noted.  Cardiovascular: Normal rate and regular rhythm.  Pulmonary/Chest: Effort normal and breath sounds normal.  Abdominal: Soft. Bowel sounds are normal. There is no tenderness.  Musculoskeletal:  limited flexion, right rotation and left rotation slightly limited but better than right, without spinous process tenderness, with paraspinal muscle tenderness the left side at SCM, normal sensation, reflexes, and pulses distal.  Lymphadenopathy:  She has no cervical adenopathy.  Neurological: She is alert and oriented to person, place, and time. She has normal reflexes.  Skin: Skin is warm and dry. No rash noted.      Assessment & Plan:    Neck pain -     meloxicam (MOBIC) 15 MG tablet; Take one daily with food for 2 weeks, can take with tylenol,  can not take with aleve, iburpofen, then as needed daily for pain -     cyclobenzaprine (FLEXERIL) 10 MG tablet; Take 1 tablet (10 mg total) 3 (three) times daily as needed by mouth for muscle spasms. - will refer to ortho if not better   The patient was advised to call immediately if she has any concerning symptoms in the interval. The patient voices understanding of current treatment options and is in agreement with the current care plan.The patient knows to call the clinic with any problems, questions or concerns or go to the ER if any further progression of symptoms.

## 2017-07-13 ENCOUNTER — Other Ambulatory Visit: Payer: Self-pay | Admitting: Physician Assistant

## 2017-08-03 ENCOUNTER — Ambulatory Visit: Payer: BLUE CROSS/BLUE SHIELD | Admitting: Internal Medicine

## 2017-08-03 VITALS — BP 138/88 | HR 84 | Temp 97.3°F | Resp 16 | Ht 64.0 in | Wt 182.2 lb

## 2017-08-03 DIAGNOSIS — E782 Mixed hyperlipidemia: Secondary | ICD-10-CM

## 2017-08-03 DIAGNOSIS — Z111 Encounter for screening for respiratory tuberculosis: Secondary | ICD-10-CM | POA: Diagnosis not present

## 2017-08-03 DIAGNOSIS — Z0000 Encounter for general adult medical examination without abnormal findings: Secondary | ICD-10-CM

## 2017-08-03 DIAGNOSIS — Z0001 Encounter for general adult medical examination with abnormal findings: Secondary | ICD-10-CM

## 2017-08-03 DIAGNOSIS — Z136 Encounter for screening for cardiovascular disorders: Secondary | ICD-10-CM

## 2017-08-03 DIAGNOSIS — I10 Essential (primary) hypertension: Secondary | ICD-10-CM | POA: Diagnosis not present

## 2017-08-03 DIAGNOSIS — K219 Gastro-esophageal reflux disease without esophagitis: Secondary | ICD-10-CM

## 2017-08-03 DIAGNOSIS — E559 Vitamin D deficiency, unspecified: Secondary | ICD-10-CM | POA: Diagnosis not present

## 2017-08-03 DIAGNOSIS — Z1211 Encounter for screening for malignant neoplasm of colon: Secondary | ICD-10-CM

## 2017-08-03 DIAGNOSIS — R5383 Other fatigue: Secondary | ICD-10-CM

## 2017-08-03 DIAGNOSIS — Z79899 Other long term (current) drug therapy: Secondary | ICD-10-CM | POA: Diagnosis not present

## 2017-08-03 DIAGNOSIS — Z1212 Encounter for screening for malignant neoplasm of rectum: Secondary | ICD-10-CM

## 2017-08-03 DIAGNOSIS — R7303 Prediabetes: Secondary | ICD-10-CM

## 2017-08-03 NOTE — Progress Notes (Signed)
[facility] Unk Pinto, M.D.     Uvaldo Bristle. Silverio Lay, P.A.-C Liane Comber, [place] [address] [place], [place] [address] Telephone [phone] Telefax [phone] Annual Screening/Preventative Visit & Comprehensive Evaluation &  Examination     This very nice 56 y.o. DWF presents for a Screening/Preventative Visit & comprehensive evaluation and management of multiple medical co-morbidities.  Patient has been followed for HTN, T2_NIDDM  Prediabetes, Hyperlipidemia and Vitamin D Deficiency.      HTN predates since 2007. Patient's BP has been controlled at home and patient denies any cardiac symptoms as chest pain, palpitations, shortness of breath, dizziness or ankle swelling. Today's BP is high normal at 138/88.      Patient's hyperlipidemia is controlled with diet and medications. Patient denies myalgias or other medication SE's. Last lipids were at goal albeit elevated Trig's.  Lab Results  Component Value Date   CHOL 165 04/25/2017   HDL 41 (L) 04/25/2017   LDLCALC 78 01/10/2017   TRIG 246 (H) 04/25/2017   CHOLHDL 4.0 04/25/2017      Patient has Morbid obesity (BMI 44) and  prediabetes (A1c 5.7% and Insulin 37 in 2012)  and patient denies reactive hypoglycemic symptoms, visual blurring, diabetic polys, or paresthesias. Last A1c was normal at goal: Lab Results  Component Value Date   HGBA1C 5.2 01/10/2017      Finally, patient has history of Vitamin D Deficiency ("22" / 2008) and last Vitamin D was at goal: Lab Results  Component Value Date   VD25OH 84 04/25/2017   Current Outpatient Medications on File Prior to Visit  Medication Sig  . albuterol (PROVENTIL HFA;VENTOLIN HFA) 108 (90 BASE) MCG/ACT inhaler Inhale into the lungs every 6 (six) hours as needed for wheezing or shortness of breath.  Marland Kitchen aspirin 81 MG tablet Take 81 mg by mouth daily.  . Cholecalciferol (VITAMIN D PO) Take 5,000 Units  by mouth. Alternates 5000 and 10000 units  . cyclobenzaprine (FLEXERIL) 10 MG tablet Take 1 tablet (10 mg total) 3 (three) times daily as needed by mouth for muscle spasms.  Marland Kitchen levocetirizine (XYZAL) 5 MG tablet   . losartan (COZAAR) 100 MG tablet TAKE ONE TABLET BY MOUTH EVERY DAY  . meloxicam (MOBIC) 15 MG tablet Take one daily with food for 2 weeks, can take with tylenol, can not take with aleve, iburpofen, then as needed daily for pain  . montelukast (SINGULAIR) 10 MG tablet Take 10 mg by mouth at bedtime.  . Multiple Vitamins-Minerals (MULTIVITAMIN PO) Take by mouth daily.  Marland Kitchen OVER THE COUNTER MEDICATION Bone strenght vitamin with Calcium and Magnesium  . pantoprazole (PROTONIX) 40 MG tablet TAKE 1 TABLET BY MOUTH ONCE DAILY  . pravastatin (PRAVACHOL) 40 MG tablet TAKE ONE TABLET BY MOUTH AT BEDTIME FOR CHOLESTEROL  . azelastine (ASTELIN) 0.1 % nasal spray Place 1 spray into both nostrils 2 (two) times daily. Use in each nostril as directed   No current facility-administered medications on file prior to visit.    Allergies  Allergen Reactions  . Ace Inhibitors     cough  . Azithromycin     rash  . Prednisone     High dose  . Vicodin [Hydrocodone-Acetaminophen]     Nausea/vomiting   Past Medical History:  Diagnosis Date  . Allergy   . Asthma   . Hyperlipidemia   . Hypertension   . Migraine   . Obesity   . Prediabetes   . Vitamin  D deficiency    Health Maintenance  Topic Date Due  . Hepatitis C Screening  [birth year]  . HIV Screening  05/31/1974  . PAP SMEAR  12/27/2012  . INFLUENZA VACCINE  04/05/2018 (Originally 03/07/2017)  . TETANUS/TDAP  08/29/2017  . MAMMOGRAM  03/27/2019  . COLONOSCOPY  04/03/2026   Immunization History  Administered Date(s) Administered  . PPD Test 05/12/2014, 05/25/2015, 07/05/2016  . Pneumococcal-Unspecified 08/29/2009  . Tdap 08/30/2007   Last Colon - 03/2016 Dr Melina Copa - recc 3 yr f Last Pap - Gyn in [place]- 03/2016 Past  Surgical History:  Procedure Laterality Date  . DILATION AND CURETTAGE, DIAGNOSTIC / THERAPEUTIC  2009  . LYMPH NODE BIOPSY Left 1991   negative  . NEVUS EXCISION  2012   dysplastic from back   Family History  Problem Relation Age of Onset  . Hypertension Mother   . COPD Father   . Cancer Father        colon  . Heart disease Father   . Hyperlipidemia Father   . Hypertension Father   . Stroke Father    Social History   Tobacco Use  . Smoking status: Never Smoker  . Smokeless tobacco: Never Used  Substance Use Topics  . Alcohol use: No  . Drug use: No    ROS Constitutional: Denies fever, chills, weight loss/gain, headaches, insomnia,  night sweats, and change in appetite. Does c/o fatigue. Eyes: Denies redness, blurred vision, diplopia, discharge, itchy, watery eyes.  ENT: Denies discharge, congestion, post nasal drip, epistaxis, sore throat, earache, hearing loss, dental pain, Tinnitus, Vertigo, Sinus pain, snoring.  Cardio: Denies chest pain, palpitations, irregular heartbeat, syncope, dyspnea, diaphoresis, orthopnea, PND, claudication, edema Respiratory: denies cough, dyspnea, DOE, pleurisy, hoarseness, laryngitis, wheezing.  Gastrointestinal: Denies dysphagia, heartburn, reflux, water brash, pain, cramps, nausea, vomiting, bloating, diarrhea, constipation, hematemesis, melena, hematochezia, jaundice, hemorrhoids Genitourinary: Denies dysuria, frequency, urgency, nocturia, hesitancy, discharge, hematuria, flank pain Breast: Breast lumps, nipple discharge, bleeding.  Musculoskeletal: Denies arthralgia, myalgia, stiffness, Jt. Swelling, pain, limp, and strain/sprain. Denies falls. Skin: Denies puritis, rash, hives, warts, acne, eczema, changing in skin lesion Neuro: No weakness, tremor, incoordination, spasms, paresthesia, pain Psychiatric: Denies confusion, memory loss, sensory loss. Denies Depression. Endocrine: Denies change in weight, skin, hair change, nocturia, and  paresthesia, diabetic polys, visual blurring, hyper / hypo glycemic episodes.  Heme/Lymph: No excessive bleeding, bruising, enlarged lymph nodes.  Physical Exam  BP 138/88   Pulse 84   Temp (!) 97.3 F (36.3 C)   Resp 16   Ht 5\' 4"  (1.626 m)   Wt 182 lb 3.2 oz (82.6 kg)   BMI 31.27 kg/m   General Appearance: Well nourished, well groomed and in no apparent distress.  Eyes: PERRLA, EOMs, conjunctiva no swelling or erythema, normal fundi and vessels. Sinuses: No frontal/maxillary tenderness ENT/Mouth: EACs patent / TMs  nl. Nares clear without erythema, swelling, mucoid exudates. Oral hygiene is good. No erythema, swelling, or exudate. Tongue normal, non-obstructing. Tonsils not swollen or erythematous. Hearing normal.  Neck: Supple, thyroid normal. No bruits, nodes or JVD. Respiratory: Respiratory effort normal.  BS equal and clear bilateral without rales, rhonci, wheezing or stridor. Cardio: Heart sounds are normal with regular rate and rhythm and no murmurs, rubs or gallops. Peripheral pulses are normal and equal bilaterally without edema. No aortic or femoral bruits. Chest: symmetric with normal excursions and percussion. Breasts: Symmetric, without lumps, nipple discharge, retractions, or fibrocystic changes.  Abdomen: Flat, soft with bowel sounds active. Nontender, no  guarding, rebound, hernias, masses, or organomegaly.  Lymphatics: Non tender without lymphadenopathy.  Genitourinary:  Musculoskeletal: Full ROM all peripheral extremities, joint stability, 5/5 strength, and normal gait. Skin: Warm and dry without rashes, lesions, cyanosis, clubbing or  ecchymosis.  Neuro: Cranial nerves intact, reflexes equal bilaterally. Normal muscle tone, no cerebellar symptoms. Sensation intact.  Pysch: Alert and oriented X 3, normal affect, Insight and Judgment appropriate.   Assessment and Plan  1. Annual Preventative Screening Examination  2. Essential hypertension  - EKG 12-Lead -  Urinalysis, Routine w reflex microscopic - CBC with Differential/Platelet - BASIC METABOLIC PANEL WITH GFR - Magnesium - TSH  3. Hyperlipidemia, mixed  - EKG 12-Lead - Hepatic function panel - Lipid panel - TSH  4. Prediabetes  - EKG 12-Lead - Hemoglobin A1c - Insulin, random  5. Vitamin D deficiency  - VITAMIN D 25 Hydroxy (Vit-D Deficiency, Fractures)  6. Gastroesophageal reflux disease, esophagitis presence not specified  - CBC with Differential/Platelet  7. Screening for colorectal cancer  - POC Hemoccult Bld/Stl (3-Cd Home Screen); Future  8. Screening for ischemic heart disease  - EKG 12-Lead  9. Screening examination for pulmonary tuberculosis  - PPD  10. Fatigue   - Iron,Total/Total Iron Binding Cap - Vitamin B12 - CBC with Differential/Platelet - TSH  11. Medication management  - Urinalysis, Routine w reflex microscopic - Microalbumin / creatinine urine ratio - CBC with Differential/Platelet - BASIC METABOLIC PANEL WITH GFR - Hepatic function panel - Magnesium - Lipid panel - TSH - Hemoglobin A1c - Insulin, random - VITAMIN D 25 Hydroxy  12. Screening for AAA (aortic abdominal aneurysm)  - [place], RETROPERITNL ABD,  LTD         Patient was counseled in prudent diet to achieve/maintain BMI less than 25 for weight control, BP monitoring, regular exercise and medications. Discussed med's effects and SE's. Screening labs and tests as requested with regular follow-up as recommended. Over 40 minutes of exam, counseling, chart review and high complex critical decision making was performed.

## 2017-08-03 NOTE — Patient Instructions (Signed)

## 2017-08-04 ENCOUNTER — Encounter: Payer: Self-pay | Admitting: Internal Medicine

## 2017-08-06 LAB — BASIC METABOLIC PANEL WITH GFR
BUN: 20 mg/dL (ref 7–25)
CO2: 26 mmol/L (ref 20–32)
Calcium: 9.6 mg/dL (ref 8.6–10.4)
Chloride: 106 mmol/L (ref 98–110)
Creat: 0.84 mg/dL (ref 0.50–1.05)
GFR, Est African American: 89 mL/min/{1.73_m2} (ref >=60)
GFR, Est Non African American: 77 mL/min/{1.73_m2} (ref >=60)
GLUCOSE: 99 mg/dL (ref 65–99)
POTASSIUM: 4.4 mmol/L (ref 3.5–5.3)
SODIUM: 141 mmol/L (ref 135–146)

## 2017-08-06 LAB — CBC WITH DIFFERENTIAL/PLATELET
BASOS ABS: 70 {cells}/uL (ref 0–200)
Basophils Relative: 1.1 %
EOS PCT: 2.2 %
Eosinophils Absolute: 141 cells/uL (ref 15–500)
HCT: 39.9 % (ref 35.0–45.0)
HEMOGLOBIN: 13.4 g/dL (ref 11.7–15.5)
Lymphs Abs: 1434 cells/uL (ref 850–3900)
MCH: 30 pg (ref 27.0–33.0)
MCHC: 33.6 g/dL (ref 32.0–36.0)
MCV: 89.3 fL (ref 80.0–100.0)
MONOS PCT: 6.4 %
MPV: 10.8 fL (ref 7.5–12.5)
NEUTROS ABS: 4346 {cells}/uL (ref 1500–7800)
NEUTROS PCT: 67.9 %
PLATELETS: 271 10*3/uL (ref 140–400)
RBC: 4.47 10*6/uL (ref 3.80–5.10)
RDW: 12.4 % (ref 11.0–15.0)
TOTAL LYMPHOCYTE: 22.4 %
WBC mixed population: 410 cells/uL (ref 200–950)
WBC: 6.4 10*3/uL (ref 3.8–10.8)

## 2017-08-06 LAB — URINALYSIS, ROUTINE W REFLEX MICROSCOPIC
Bilirubin Urine: NEGATIVE
Glucose, UA: NEGATIVE
HGB URINE DIPSTICK: NEGATIVE
KETONES UR: NEGATIVE
Leukocytes, UA: NEGATIVE
NITRITE: NEGATIVE
Protein, ur: NEGATIVE
Specific Gravity, Urine: 1.008 (ref 1.001–1.03)
pH: 7 (ref 5.0–8.0)

## 2017-08-06 LAB — HEPATIC FUNCTION PANEL
AG RATIO: 2.2 (calc) (ref 1.0–2.5)
ALKALINE PHOSPHATASE (APISO): 93 U/L (ref 33–130)
ALT: 19 U/L (ref 6–29)
AST: 19 U/L (ref 10–35)
Albumin: 4.3 g/dL (ref 3.6–5.1)
BILIRUBIN INDIRECT: 0.3 mg/dL (ref 0.2–1.2)
Bilirubin, Direct: 0.1 mg/dL (ref 0.0–0.2)
GLOBULIN: 2 g/dL (ref 1.9–3.7)
TOTAL PROTEIN: 6.3 g/dL (ref 6.1–8.1)
Total Bilirubin: 0.4 mg/dL (ref 0.2–1.2)

## 2017-08-06 LAB — LIPID PANEL
CHOLESTEROL: 166 mg/dL (ref <200)
HDL: 48 mg/dL — AB (ref >50)
LDL Cholesterol (Calc): 93 mg/dL (calc)
Non-HDL Cholesterol (Calc): 118 mg/dL (calc) (ref <130)
Total CHOL/HDL Ratio: 3.5 (calc) (ref <5.0)
Triglycerides: 146 mg/dL (ref <150)

## 2017-08-06 LAB — VITAMIN B12: Vitamin B-12: 484 pg/mL (ref 200–1100)

## 2017-08-06 LAB — IRON, TOTAL/TOTAL IRON BINDING CAP
%SAT: 22 % (ref 11–50)
IRON: 63 ug/dL (ref 45–160)
TIBC: 282 ug/dL (ref 250–450)

## 2017-08-06 LAB — MAGNESIUM: MAGNESIUM: 2.2 mg/dL (ref 1.5–2.5)

## 2017-08-06 LAB — VITAMIN D 25 HYDROXY (VIT D DEFICIENCY, FRACTURES): Vit D, 25-Hydroxy: 88 ng/mL (ref 30–100)

## 2017-08-06 LAB — HEMOGLOBIN A1C
EAG (MMOL/L): 5.8 (calc)
HEMOGLOBIN A1C: 5.3 %{Hb} (ref <5.7)
MEAN PLASMA GLUCOSE: 105 (calc)

## 2017-08-06 LAB — INSULIN, RANDOM: Insulin: 29.9 u[IU]/mL — ABNORMAL HIGH (ref 2.0–19.6)

## 2017-08-06 LAB — MICROALBUMIN / CREATININE URINE RATIO
CREATININE, URINE: 35 mg/dL (ref 20–275)
MICROALB UR: 0.2 mg/dL
Microalb Creat Ratio: 6 mcg/mg creat (ref <30)

## 2017-08-06 LAB — TB SKIN TEST
INDURATION: 0 mm
TB SKIN TEST: NEGATIVE

## 2017-08-06 LAB — TSH: TSH: 2.87 mIU/L (ref 0.40–4.50)

## 2017-08-14 ENCOUNTER — Encounter: Payer: Self-pay | Admitting: Internal Medicine

## 2017-10-10 ENCOUNTER — Other Ambulatory Visit: Payer: Self-pay | Admitting: Internal Medicine

## 2017-10-16 DIAGNOSIS — H5213 Myopia, bilateral: Secondary | ICD-10-CM | POA: Diagnosis not present

## 2017-11-05 NOTE — Progress Notes (Signed)
FOLLOW UP  Assessment and Plan:   Hypertension Well controlled with current medications  Monitor blood pressure at home; patient to call if consistently greater than 130/80 Continue DASH diet.   Reminder to go to the ER if any CP, SOB, nausea, dizziness, severe HA, changes vision/speech, left arm numbness and tingling and jaw pain.  Cholesterol Currently at goal; continue statin therapy Continue low cholesterol diet and exercise.  Check lipid panel.   Other abnormal glucose Recent A1Cs at goal Discussed diet/exercise, weight management  Defer A1C; check BMP  Obesity with co morbidities Long discussion about weight loss, diet, and exercise Recommended diet heavy in fruits and veggies and low in animal meats, cheeses, and dairy products, appropriate calorie intake Discussed ideal weight for height (below 150lb) and initial weight goal (180lb) Patient will work on reducing stress eating, portion control Will follow up in 3 months  Vitamin D Def  At goal at last visit; continue supplementation to maintain goal of 70-100 Defer Vit D level  Neck muscle strain Patient requesting refill of mobic/meloxicam - discussed risks with hx of NSAID gastritis Patient expresses understanding and would like to proceed, she will contact us back if any problems, continue with PPI therapy  Continue diet and meds as discussed. Further disposition pending results of labs. Discussed med's effects and SE's.   Over 30 minutes of exam, counseling, chart review, and critical decision making was performed.   Future Appointments  Date Time Provider [place]  02/06/2018  9:30 AM Unk Pinto, MD [facility] None  08/27/2018  9:00 AM Unk Pinto, MD [facility] None    ----------------------------------------------------------------------------------------------------------------------  HPI 57 y.o. female  presents for 3 month follow up on hypertension, cholesterol, glucose management,  obesity and vitamin D deficiency.   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    BMI is Body mass index is 32.44 kg/m., she has been working on diet and exercise, but reports stressful few months and feels she has been stress eating.  Wt Readings from Last 3 Encounters:  11/07/17 189 lb (85.7 kg)  08/03/17 182 lb 3.2 oz (82.6 kg)  06/18/17 182 lb 3.2 oz (82.6 kg)   Her blood pressure has been controlled at home, today their BP is BP: 132/88  She does workout. She denies chest pain, shortness of breath, dizziness.   She is on cholesterol medication (pravastatin 40 mg daily) and denies myalgias. Her cholesterol is at goal. The cholesterol last visit was:   Lab Results  Component Value Date   CHOL 166 08/03/2017   HDL 48 (L) 08/03/2017   LDLCALC 93 08/03/2017   TRIG 146 08/03/2017   CHOLHDL 3.5 08/03/2017    She has been working on diet and exercise for glucose management, and denies foot ulcerations, increased appetite, nausea, paresthesia of the feet, polydipsia, polyuria, visual disturbances, vomiting and weight loss. Last A1C in the office was:  Lab Results  Component Value Date   HGBA1C 5.3 08/03/2017   Patient is on Vitamin D supplement and at goal at recent check:    Lab Results  Component Value Date   VD25OH 88 08/03/2017        Current Medications:  Current Outpatient Medications on File Prior to Visit  Medication Sig  . albuterol (PROVENTIL  HFA;VENTOLIN HFA) 108 (90 BASE) MCG/ACT inhaler Inhale into the lungs every 6 (six) hours as needed for wheezing or shortness of breath.  Marland Kitchen aspirin 81 MG tablet Take 81 mg by mouth daily.  . Cholecalciferol (VITAMIN D PO) Take 5,000 Units by mouth. Alternates 5000 and 10000 units  .  levocetirizine (XYZAL) 5 MG tablet   . losartan (COZAAR) 100 MG tablet TAKE ONE TABLET BY MOUTH EVERY DAY  . montelukast (SINGULAIR) 10 MG tablet Take 10 mg by mouth at bedtime.  . Multiple Vitamins-Minerals (MULTIVITAMIN PO) Take by mouth daily.  Marland Kitchen OVER THE COUNTER MEDICATION Bone strenght vitamin with Calcium and Magnesium  . pantoprazole (PROTONIX) 40 MG tablet TAKE 1 TABLET BY MOUTH ONCE DAILY  . pravastatin (PRAVACHOL) 40 MG tablet TAKE ONE TABLET BY MOUTH AT BEDTIME FOR CHOLESTEROL  . azelastine (ASTELIN) 0.1 % nasal spray Place 1 spray into both nostrils 2 (two) times daily. Use in each nostril as directed  . cyclobenzaprine (FLEXERIL) 10 MG tablet Take 1 tablet (10 mg total) 3 (three) times daily as needed by mouth for muscle spasms. (Patient not taking: Reported on 11/07/2017)  . meloxicam (MOBIC) 15 MG tablet Take one daily with food for 2 weeks, can take with tylenol, can not take with aleve, iburpofen, then as needed daily for pain (Patient not taking: Reported on 11/07/2017)   No current facility-administered medications on file prior to visit.      Allergies:  Allergies  Allergen Reactions  . Ace Inhibitors     cough  . Azithromycin     rash  . Prednisone     High dose  . Vicodin [Hydrocodone-Acetaminophen]     Nausea/vomiting     Medical History:  Past Medical History:  Diagnosis Date  . Allergy   . Asthma   . Hyperlipidemia   . Hypertension   . Migraine   . Obesity   . Prediabetes   . Vitamin D deficiency    Family history- Reviewed and unchanged Social history- Reviewed and unchanged   Review of Systems:  Review of Systems  Constitutional: Negative for malaise/fatigue and weight loss.  HENT: Negative for hearing loss and tinnitus.   Eyes: Negative for blurred vision and double vision.  Respiratory: Negative for cough, shortness of breath and wheezing.   Cardiovascular: Negative for chest pain, palpitations, orthopnea, claudication and leg swelling.   Gastrointestinal: Negative for abdominal pain, blood in stool, constipation, diarrhea, heartburn, melena, nausea and vomiting.  Genitourinary: Negative.   Musculoskeletal: Positive for neck pain (Intermittent neck strain pain on right). Negative for joint pain and myalgias.  Skin: Negative for rash.  Neurological: Negative for dizziness, tingling, sensory change, weakness and headaches.  Endo/Heme/Allergies: Negative for polydipsia.  Psychiatric/Behavioral: Negative.   All other systems reviewed and are negative.   Physical Exam: BP 132/88   Pulse 70   Temp (!) 97.2 F (36.2 C)   Ht 5\' 4"  (1.626 m)   Wt 189 lb (85.7 kg)   SpO2 96%   BMI 32.44 kg/m  Wt Readings from Last 3 Encounters:  11/07/17 189 lb (85.7 kg)  08/03/17 182 lb 3.2 oz (82.6 kg)  06/18/17 182 lb 3.2 oz (82.6 kg)   General Appearance: Well nourished, in no apparent distress. Eyes: PERRLA, EOMs, conjunctiva no swelling or erythema Sinuses: No Frontal/maxillary tenderness ENT/Mouth: Ext aud canals clear, TMs without erythema, bulging. No erythema, swelling, or exudate on post pharynx.  Tonsils not swollen or erythematous. Hearing normal.  Neck: Supple, thyroid normal.  Respiratory: Respiratory effort normal, BS equal bilaterally without rales, rhonchi, wheezing or stridor.  Cardio: RRR with no MRGs. Brisk peripheral pulses without edema.  Abdomen: Soft, + BS.  Non tender, no guarding, rebound, hernias, masses. Lymphatics: Non tender without lymphadenopathy.  Musculoskeletal: Full ROM, 5/5 strength, Normal gait. Some tenderness to palpation in cervical musculature/trap on left.  Skin: Warm, dry without rashes, lesions, ecchymosis.  Neuro: Cranial nerves intact. No cerebellar symptoms.  Psych: Awake and oriented X 3, normal affect, Insight and Judgment appropriate.    [name], NP 8:56 AM [facility]

## 2017-11-07 ENCOUNTER — Encounter: Payer: Self-pay | Admitting: Adult Health

## 2017-11-07 ENCOUNTER — Ambulatory Visit: Payer: BLUE CROSS/BLUE SHIELD | Admitting: Adult Health

## 2017-11-07 VITALS — BP 132/88 | HR 70 | Temp 97.2°F | Ht 64.0 in | Wt 189.0 lb

## 2017-11-07 DIAGNOSIS — R7309 Other abnormal glucose: Secondary | ICD-10-CM | POA: Diagnosis not present

## 2017-11-07 DIAGNOSIS — E669 Obesity, unspecified: Secondary | ICD-10-CM | POA: Diagnosis not present

## 2017-11-07 DIAGNOSIS — Z79899 Other long term (current) drug therapy: Secondary | ICD-10-CM

## 2017-11-07 DIAGNOSIS — E782 Mixed hyperlipidemia: Secondary | ICD-10-CM | POA: Diagnosis not present

## 2017-11-07 DIAGNOSIS — E559 Vitamin D deficiency, unspecified: Secondary | ICD-10-CM

## 2017-11-07 DIAGNOSIS — I10 Essential (primary) hypertension: Secondary | ICD-10-CM | POA: Diagnosis not present

## 2017-11-07 DIAGNOSIS — M542 Cervicalgia: Secondary | ICD-10-CM | POA: Diagnosis not present

## 2017-11-07 LAB — HEPATIC FUNCTION PANEL
AG Ratio: 2.4 (calc) (ref 1.0–2.5)
ALBUMIN MSPROF: 4.5 g/dL (ref 3.6–5.1)
ALKALINE PHOSPHATASE (APISO): 90 U/L (ref 33–130)
ALT: 22 U/L (ref 6–29)
AST: 18 U/L (ref 10–35)
BILIRUBIN DIRECT: 0.1 mg/dL (ref 0.0–0.2)
BILIRUBIN INDIRECT: 0.3 mg/dL (ref 0.2–1.2)
Globulin: 1.9 g/dL (calc) (ref 1.9–3.7)
Total Bilirubin: 0.4 mg/dL (ref 0.2–1.2)
Total Protein: 6.4 g/dL (ref 6.1–8.1)

## 2017-11-07 LAB — CBC WITH DIFFERENTIAL/PLATELET
Basophils Absolute: 68 cells/uL (ref 0–200)
Basophils Relative: 1.2 %
EOS PCT: 3.2 %
Eosinophils Absolute: 182 cells/uL (ref 15–500)
HCT: 40.4 % (ref 35.0–45.0)
Hemoglobin: 13.9 g/dL (ref 11.7–15.5)
Lymphs Abs: 1516 cells/uL (ref 850–3900)
MCH: 30.5 pg (ref 27.0–33.0)
MCHC: 34.4 g/dL (ref 32.0–36.0)
MCV: 88.6 fL (ref 80.0–100.0)
MPV: 10.9 fL (ref 7.5–12.5)
Monocytes Relative: 7.7 %
NEUTROS PCT: 61.3 %
Neutro Abs: 3494 cells/uL (ref 1500–7800)
PLATELETS: 235 10*3/uL (ref 140–400)
RBC: 4.56 10*6/uL (ref 3.80–5.10)
RDW: 12.7 % (ref 11.0–15.0)
TOTAL LYMPHOCYTE: 26.6 %
WBC mixed population: 439 cells/uL (ref 200–950)
WBC: 5.7 10*3/uL (ref 3.8–10.8)

## 2017-11-07 LAB — LIPID PANEL
CHOLESTEROL: 190 mg/dL (ref <200)
HDL: 52 mg/dL (ref >50)
LDL CHOLESTEROL (CALC): 108 mg/dL — AB
NON-HDL CHOLESTEROL (CALC): 138 mg/dL — AB (ref <130)
Total CHOL/HDL Ratio: 3.7 (calc) (ref <5.0)
Triglycerides: 186 mg/dL — ABNORMAL HIGH (ref <150)

## 2017-11-07 LAB — BASIC METABOLIC PANEL WITH GFR
BUN: 20 mg/dL (ref 7–25)
CALCIUM: 9.8 mg/dL (ref 8.6–10.4)
CHLORIDE: 105 mmol/L (ref 98–110)
CO2: 30 mmol/L (ref 20–32)
Creat: 0.87 mg/dL (ref 0.50–1.05)
GFR, EST AFRICAN AMERICAN: 85 mL/min/{1.73_m2} (ref >=60)
GFR, Est Non African American: 73 mL/min/{1.73_m2} (ref >=60)
Glucose, Bld: 93 mg/dL (ref 65–99)
Potassium: 4.8 mmol/L (ref 3.5–5.3)
SODIUM: 142 mmol/L (ref 135–146)

## 2017-11-07 LAB — MAGNESIUM: MAGNESIUM: 2.1 mg/dL (ref 1.5–2.5)

## 2017-11-07 LAB — TSH: TSH: 3.14 mIU/L (ref 0.40–4.50)

## 2017-11-07 MED ORDER — MELOXICAM 15 MG PO TABS: ORAL_TABLET | ORAL | 1 refills | Status: DC

## 2017-11-07 NOTE — Patient Instructions (Signed)
Aim for 7+ servings of fruits and vegetables daily  80+ fluid ounces of water or unsweet tea for healthy kidneys  Limit alcohol intake  Limit animal fats in diet for cholesterol and heart health - choose grass fed whenever available  Aim for low stress - take time to unwind and care for your mental health  Aim for 150 min of moderate intensity exercise weekly for heart health, and weights twice weekly for bone health  Aim for 7-9 hours of sleep daily   Are you an emotional eater? Do you eat more when you're feeling stressed? Do you eat when you're not hungry or when you're full? Do you eat to feel better (to calm and soothe yourself when you're sad, mad, bored, anxious, etc.)? Do you reward yourself with food? Do you regularly eat until you've stuffed yourself? Does food make you feel safe? Do you feel like food is a friend? Do you feel powerless or out of control around food?  If you answered yes to some of these questions than it is likely that you are an emotional eater. This is normally a learned behavior and can take time to first recognize the signs and second BREAK THE HABIT. But here is more information and tips to help.   The difference between emotional hunger and physical hunger Emotional hunger can be powerful, so it's easy to mistake it for physical hunger. But there are clues you can look for to help you tell physical and emotional hunger apart.  Emotional hunger comes on suddenly. It hits you in an instant and feels overwhelming and urgent. Physical hunger, on the other hand, comes on more gradually. The urge to eat doesn't feel as dire or demand instant satisfaction (unless you haven't eaten for a very long time).  Emotional hunger craves specific comfort foods. When you're physically hungry, almost anything sounds good-including healthy stuff like vegetables. But emotional hunger craves junk food or sugary snacks that provide an instant rush. You feel like you need  cheesecake or pizza, and nothing else will do.  Emotional hunger often leads to mindless eating. Before you know it, you've eaten a whole bag of chips or an entire pint of ice cream without really paying attention or fully enjoying it. When you're eating in response to physical hunger, you're typically more aware of what you're doing.  Emotional hunger isn't satisfied once you're full. You keep wanting more and more, often eating until you're uncomfortably stuffed. Physical hunger, on the other hand, doesn't need to be stuffed. You feel satisfied when your stomach is full.  Emotional hunger isn't located in the stomach. Rather than a growling belly or a pang in your stomach, you feel your hunger as a craving you can't get out of your head. You're focused on specific textures, tastes, and smells.  Emotional hunger often leads to regret, guilt, or shame. When you eat to satisfy physical hunger, you're unlikely to feel guilty or ashamed because you're simply giving your body what it needs. If you feel guilty after you eat, it's likely because you know deep down that you're not eating for nutritional reasons.  Identify your emotional eating triggers What situations, places, or feelings make you reach for the comfort of food? Most emotional eating is linked to unpleasant feelings, but it can also be triggered by positive emotions, such as rewarding yourself for achieving a goal or celebrating a holiday or happy event. Common causes of emotional eating include:  Stuffing emotions - Eating can  be a way to temporarily silence or "stuff down" uncomfortable emotions, including anger, fear, sadness, anxiety, loneliness, resentment, and shame. While you're numbing yourself with food, you can avoid the difficult emotions you'd rather not feel.  Boredom or feelings of emptiness - Do you ever eat simply to give yourself something to do, to relieve boredom, or as a way to fill a void in your life? You feel  unfulfilled and empty, and food is a way to occupy your mouth and your time. In the moment, it fills you up and distracts you from underlying feelings of purposelessness and dissatisfaction with your life.  Childhood habits - Think back to your childhood memories of food. Did your parents reward good behavior with ice cream, take you out for pizza when you got a good report card, or serve you sweets when you were feeling sad? These habits can often carry over into adulthood. Or your eating may be driven by nostalgia-for cherished memories of grilling burgers in the backyard with your dad or baking and eating cookies with your mom.  Social influences - Getting together with other people for a meal is a great way to relieve stress, but it can also lead to overeating. It's easy to overindulge simply because the food is there or because everyone else is eating. You may also overeat in social situations out of nervousness. Or perhaps your family or circle of friends encourages you to overeat, and it's easier to go along with the group.  Stress - Ever notice how stress makes you hungry? It's not just in your mind. When stress is chronic, as it so often is in our chaotic, fast-paced world, your body produces high levels of the stress hormone, cortisol. Cortisol triggers cravings for salty, sweet, and fried foods-foods that give you a burst of energy and pleasure. The more uncontrolled stress in your life, the more likely you are to turn to food for emotional relief.  Find other ways to feed your feelings If you don't know how to manage your emotions in a way that doesn't involve food, you won't be able to control your eating habits for very long. Diets so often fail because they offer logical nutritional advice which only works if you have conscious control over your eating habits. It doesn't work when emotions hijack the process, demanding an immediate payoff with food.  In order to stop emotional eating, you  have to find other ways to fulfill yourself emotionally. It's not enough to understand the cycle of emotional eating or even to understand your triggers, although that's a huge first step. You need alternatives to food that you can turn to for emotional fulfillment.  Alternatives to emotional eating If you're depressed or lonely, call someone who always makes you feel better, play with your dog or cat, or look at a favorite photo or cherished memento.  If you're anxious, expend your nervous energy by dancing to your favorite song, squeezing a stress ball, or taking a brisk walk.  If you're exhausted, treat yourself with a hot cup of tea, take a bath, light some scented candles, or wrap yourself in a warm blanket.  If you're bored, read a good book, watch a comedy show, explore the outdoors, or turn to an activity you enjoy (woodworking, playing the guitar, shooting hoops, scrapbooking, etc.).  What is mindful eating? Mindful eating is a practice that develops your awareness of eating habits and allows you to pause between your triggers and your actions. Most emotional  eaters feel powerless over their food cravings. When the urge to eat hits, you feel an almost unbearable tension that demands to be fed, right now. Because you've tried to resist in the past and failed, you believe that your willpower just isn't up to snuff. But the truth is that you have more power over your cravings than you think.  Take 5 before you give in to a craving Emotional eating tends to be automatic and virtually mindless. Before you even realize what you're doing, you've reached for a tub of ice cream and polished off half of it. But if you can take a moment to pause and reflect when you're hit with a craving, you give yourself the opportunity to make a different decision.  Can you put off eating for five minutes? Or just start with one minute. Don't tell yourself you can't give in to the craving; remember, the forbidden  is extremely tempting. Just tell yourself to wait.  While you're waiting, check in with yourself. How are you feeling? What's going on emotionally? Even if you end up eating, you'll have a better understanding of why you did it. This can help you set yourself up for a different response next time.  How to practice mindful eating Eating while you're also doing other things-such as watching TV, driving, or playing with your phone-can prevent you from fully enjoying your food. Since your mind is elsewhere, you may not feel satisfied or continue eating even though you're no longer hungry. Eating more mindfully can help focus your mind on your food and the pleasure of a meal and curb overeating.   Eat your meals in a calm place with no distractions, aside from any dining companions.  Try eating with your non-dominant hand or using chopsticks instead of a knife and fork. Eating in such a non-familiar way can slow down how fast you eat and ensure your mind stays focused on your food.  Allow yourself enough time not to have to rush your meal. Set a timer for 20 minutes and pace yourself so you spend at least that much time eating.  Take small bites and chew them well, taking time to notice the different flavors and textures of each mouthful.  Put your utensils down between bites. Take time to consider how you feel-hungry, satiated-before picking up your utensils again.  Try to stop eating before you are full.It takes time for the signal to reach your brain that you've had enough. Don't feel obligated to always clean your plate.  When you've finished your food, take a few moments to assess if you're really still hungry before opting for an extra serving or dessert.  Learn to accept your feelings-even the bad ones  While it may seem that the core problem is that you're powerless over food, emotional eating actually stems from feeling powerless over your emotions. You don't feel capable of dealing with  your feelings head on, so you avoid them with food.  Recommended reading  Mini Habits for weight loss  Healthy Eating: A guide to the new nutrition - [place] Report  10 Tips for Mindful Eating - How mindfulness can help you fully enjoy a meal and the experience of eating-with moderation and restraint. ([place])  Weight Loss: Gain Control of Emotional Eating - Tips to regain control of your eating habits. [facility])  Why Stress Causes People to Overeat -Tips on controlling stress eating. ([place])  Mindful Eating Meditations -  Free online mindfulness meditations. ([facility])

## 2017-11-30 DIAGNOSIS — J3089 Other allergic rhinitis: Secondary | ICD-10-CM | POA: Diagnosis not present

## 2017-11-30 DIAGNOSIS — J4530 Mild persistent asthma, uncomplicated: Secondary | ICD-10-CM | POA: Diagnosis not present

## 2017-11-30 DIAGNOSIS — J301 Allergic rhinitis due to pollen: Secondary | ICD-10-CM | POA: Diagnosis not present

## 2017-11-30 DIAGNOSIS — H1045 Other chronic allergic conjunctivitis: Secondary | ICD-10-CM | POA: Diagnosis not present

## 2018-01-01 ENCOUNTER — Other Ambulatory Visit: Payer: Self-pay | Admitting: Internal Medicine

## 2018-01-14 ENCOUNTER — Other Ambulatory Visit: Payer: Self-pay | Admitting: Physician Assistant

## 2018-02-06 ENCOUNTER — Ambulatory Visit: Payer: BLUE CROSS/BLUE SHIELD | Admitting: Internal Medicine

## 2018-02-06 VITALS — BP 132/84 | HR 72 | Temp 97.6°F | Resp 16 | Ht 64.0 in | Wt 192.2 lb

## 2018-02-06 DIAGNOSIS — E782 Mixed hyperlipidemia: Secondary | ICD-10-CM

## 2018-02-06 DIAGNOSIS — K219 Gastro-esophageal reflux disease without esophagitis: Secondary | ICD-10-CM | POA: Diagnosis not present

## 2018-02-06 DIAGNOSIS — I10 Essential (primary) hypertension: Secondary | ICD-10-CM

## 2018-02-06 DIAGNOSIS — Z79899 Other long term (current) drug therapy: Secondary | ICD-10-CM | POA: Diagnosis not present

## 2018-02-06 DIAGNOSIS — R7303 Prediabetes: Secondary | ICD-10-CM

## 2018-02-06 DIAGNOSIS — E559 Vitamin D deficiency, unspecified: Secondary | ICD-10-CM | POA: Diagnosis not present

## 2018-02-06 NOTE — Progress Notes (Signed)
This very nice 57 y.o. DWF presents for 6 month follow up with HTN, HLD, Pre-Diabetes and Vitamin D Deficiency.      Patient is treated for HTN (2007)  & BP has been controlled at home. Today's BP is at goal - 132/84. Patient has had no complaints of any cardiac type chest pain, palpitations, dyspnea / orthopnea / PND, dizziness, claudication, or dependent edema.     Hyperlipidemia is near controlled with diet & meds. Patient denies myalgias or other med SE's. Last Lipids were near goal albeit elevate Trig's: Lab Results  Component Value Date   CHOL 185 02/06/2018   HDL 53 02/06/2018   LDLCALC 103 (H) 02/06/2018   TRIG 170 (H) 02/06/2018   CHOLHDL 3.5 02/06/2018      Also, the patient has history of Morbid Obesity (BMI 33) and PreDiabetes  (A1c 5.7% /Insulin 37/2012)  and has had no symptoms of reactive hypoglycemia, diabetic polys, paresthesias or visual blurring.  Last A1c was Normal & at goal: Lab Results  Component Value Date   HGBA1C 5.1 02/06/2018      Further, the patient also has history of Vitamin D Deficiency  ("22" / 2008)  and supplements vitamin D without any suspected side-effects. Last vitamin D was at goal:  Lab Results  Component Value Date   VD25OH 73 02/06/2018   Current Outpatient Medications on File Prior to Visit  Medication Sig  . albuterol (PROVENTIL HFA;VENTOLIN HFA) 108 (90 BASE) MCG/ACT inhaler Inhale into the lungs every 6 (six) hours as needed for wheezing or shortness of breath.  Marland Kitchen aspirin 81 MG tablet Take 81 mg by mouth daily.  . budesonide-formoterol (SYMBICORT) 160-4.5 MCG/ACT inhaler Inhale 2 puffs into the lungs 2 (two) times daily.  . Cholecalciferol (VITAMIN D PO) Take 5,000 Units by mouth. Alternates 5000 and 10000 units  . levocetirizine (XYZAL) 5 MG tablet   . losartan (COZAAR) 100 MG tablet TAKE ONE TABLET BY MOUTH EVERY DAY  . meloxicam (MOBIC) 15 MG tablet Take one daily with food for 2 weeks, can take with tylenol, can not take with  aleve, iburpofen, then as needed daily for pain  . montelukast (SINGULAIR) 10 MG tablet Take 10 mg by mouth at bedtime.  . Multiple Vitamins-Minerals (MULTIVITAMIN PO) Take by mouth daily.  Marland Kitchen OVER THE COUNTER MEDICATION Bone strenght vitamin with Calcium and Magnesium  . pantoprazole (PROTONIX) 40 MG tablet TAKE ONE TABLET BY MOUTH EVERY DAY  . pravastatin (PRAVACHOL) 40 MG tablet TAKE ONE TABLET BY MOUTH AT BEDTIME FOR CHOLESTEROL   No current facility-administered medications on file prior to visit.    Allergies  Allergen Reactions  . Ace Inhibitors     cough  . Azithromycin     rash  . Prednisone     High dose  . Vicodin [Hydrocodone-Acetaminophen]     Nausea/vomiting   PMHx:   Past Medical History:  Diagnosis Date  . Allergy   . Asthma   . Hyperlipidemia   . Hypertension   . Migraine   . Obesity   . Prediabetes   . Vitamin D deficiency    Immunization History  Administered Date(s) Administered  . PPD Test 05/12/2014, 05/25/2015, 07/05/2016, 08/03/2017  . Pneumococcal-Unspecified 08/29/2009  . Tdap 08/30/2007   Past Surgical History:  Procedure Laterality Date  . DILATION AND CURETTAGE, DIAGNOSTIC / THERAPEUTIC  2009  . LYMPH NODE BIOPSY Left 1991   negative  . NEVUS EXCISION  2012   dysplastic  from back   FHx:    Reviewed / unchanged  SHx:    Reviewed / unchanged   Systems Review:  Constitutional: Denies fever, chills, wt changes, headaches, insomnia, fatigue, night sweats, change in appetite. Eyes: Denies redness, blurred vision, diplopia, discharge, itchy, watery eyes.  ENT: Denies discharge, congestion, post nasal drip, epistaxis, sore throat, earache, hearing loss, dental pain, tinnitus, vertigo, sinus pain, snoring.  CV: Denies chest pain, palpitations, irregular heartbeat, syncope, dyspnea, diaphoresis, orthopnea, PND, claudication or edema. Respiratory: denies cough, dyspnea, DOE, pleurisy, hoarseness, laryngitis, wheezing.  Gastrointestinal: Denies  dysphagia, odynophagia, heartburn, reflux, water brash, abdominal pain or cramps, nausea, vomiting, bloating, diarrhea, constipation, hematemesis, melena, hematochezia  or hemorrhoids. Genitourinary: Denies dysuria, frequency, urgency, nocturia, hesitancy, discharge, hematuria or flank pain. Musculoskeletal: Denies arthralgias, myalgias, stiffness, jt. swelling, pain, limping or strain/sprain.  Skin: Denies pruritus, rash, hives, warts, acne, eczema or change in skin lesion(s). Neuro: No weakness, tremor, incoordination, spasms, paresthesia or pain. Psychiatric: Denies confusion, memory loss or sensory loss. Endo: Denies change in weight, skin or hair change.  Heme/Lymph: No excessive bleeding, bruising or enlarged lymph nodes.  Physical Exam  BP 132/84   Pulse 72   Temp 97.6 F (36.4 C)   Resp 16   Ht 5\' 4"  (1.626 m)   Wt 192 lb 3.2 oz (87.2 kg)   BMI 32.99 kg/m   Appears over nourished, well groomed  and in no distress.  Eyes: PERRLA, EOMs, conjunctiva no swelling or erythema. Sinuses: No frontal/maxillary tenderness ENT/Mouth: EAC's clear, TM's nl w/o erythema, bulging. Nares clear w/o erythema, swelling, exudates. Oropharynx clear without erythema or exudates. Oral hygiene is good. Tongue normal, non obstructing. Hearing intact.  Neck: Supple. Thyroid not palpable. Car 2+/2+ without bruits, nodes or JVD. Chest: Respirations nl with BS clear & equal w/o rales, rhonchi, wheezing or stridor.  Cor: Heart sounds normal w/ regular rate and rhythm without sig. murmurs, gallops, clicks or rubs. Peripheral pulses normal and equal  without edema.  Abdomen: Soft & bowel sounds normal. Non-tender w/o guarding, rebound, hernias, masses or organomegaly.  Lymphatics: Unremarkable.  Musculoskeletal: Full ROM all peripheral extremities, joint stability, 5/5 strength and normal gait.  Skin: Warm, dry without exposed rashes, lesions or ecchymosis apparent.  Neuro: Cranial nerves intact, reflexes  equal bilaterally. Sensory-motor testing grossly intact. Tendon reflexes grossly intact.  Pysch: Alert & oriented x 3.  Insight and judgement nl & appropriate. No ideations.  Assessment and Plan:  1. Essential hypertension  - Continue medication, monitor blood pressure at home.  - Continue DASH diet.  Reminder to go to the ER if any CP,  SOB, nausea, dizziness, severe HA, changes vision/speech.  - CBC with Differential/Platelet - COMPLETE METABOLIC PANEL WITH GFR - Magnesium - TSH  2. Hyperlipidemia, mixed  - Continue diet/meds, exercise,& lifestyle modifications.  - Continue monitor periodic cholesterol/liver & renal functions   - Lipid panel - TSH  3. Prediabetes  - Continue diet, exercise, lifestyle modifications.  - Monitor appropriate labs.  - Hemoglobin A1c - Insulin, random  4. Vitamin D deficiency  - Continue supplementation.   - VITAMIN D 25 Hydroxyl  5. Gastroesophageal reflux disease  - CBC with Differential/Platelet  6. Medication management  - CBC with Differential/Platelet - COMPLETE METABOLIC PANEL WITH GFR - Magnesium - Lipid panel - TSH - Hemoglobin A1c - Insulin, random - VITAMIN D 25 Hydroxyl       Discussed  regular exercise, BP monitoring, weight control to achieve/maintain BMI less than 25  and discussed med and SE's. Recommended labs to assess and monitor clinical status with further disposition pending results of labs. Over 30 minutes of exam, counseling, chart review was performed.

## 2018-02-06 NOTE — Patient Instructions (Signed)

## 2018-02-07 LAB — CBC WITH DIFFERENTIAL/PLATELET
BASOS ABS: 67 {cells}/uL (ref 0–200)
BASOS PCT: 1.1 %
EOS ABS: 189 {cells}/uL (ref 15–500)
EOS PCT: 3.1 %
HCT: 40.8 % (ref 35.0–45.0)
HEMOGLOBIN: 13.7 g/dL (ref 11.7–15.5)
Lymphs Abs: 1647 cells/uL (ref 850–3900)
MCH: 30.4 pg (ref 27.0–33.0)
MCHC: 33.6 g/dL (ref 32.0–36.0)
MCV: 90.5 fL (ref 80.0–100.0)
MONOS PCT: 6.5 %
MPV: 11 fL (ref 7.5–12.5)
NEUTROS ABS: 3800 {cells}/uL (ref 1500–7800)
Neutrophils Relative %: 62.3 %
Platelets: 219 10*3/uL (ref 140–400)
RBC: 4.51 10*6/uL (ref 3.80–5.10)
RDW: 12.5 % (ref 11.0–15.0)
Total Lymphocyte: 27 %
WBC mixed population: 397 cells/uL (ref 200–950)
WBC: 6.1 10*3/uL (ref 3.8–10.8)

## 2018-02-07 LAB — HEMOGLOBIN A1C
Hgb A1c MFr Bld: 5.1 % of total Hgb (ref <5.7)
Mean Plasma Glucose: 100 (calc)
eAG (mmol/L): 5.5 (calc)

## 2018-02-07 LAB — VITAMIN D 25 HYDROXY (VIT D DEFICIENCY, FRACTURES): Vit D, 25-Hydroxy: 73 ng/mL (ref 30–100)

## 2018-02-07 LAB — LIPID PANEL
Cholesterol: 185 mg/dL (ref <200)
HDL: 53 mg/dL (ref >50)
LDL Cholesterol (Calc): 103 mg/dL (calc) — ABNORMAL HIGH
Non-HDL Cholesterol (Calc): 132 mg/dL (calc) — ABNORMAL HIGH (ref <130)
TRIGLYCERIDES: 170 mg/dL — AB (ref <150)
Total CHOL/HDL Ratio: 3.5 (calc) (ref <5.0)

## 2018-02-07 LAB — COMPLETE METABOLIC PANEL WITH GFR
AG Ratio: 2.3 (calc) (ref 1.0–2.5)
ALBUMIN MSPROF: 4.5 g/dL (ref 3.6–5.1)
ALT: 21 U/L (ref 6–29)
AST: 22 U/L (ref 10–35)
Alkaline phosphatase (APISO): 75 U/L (ref 33–130)
BUN / CREAT RATIO: 30 (calc) — AB (ref 6–22)
BUN: 27 mg/dL — AB (ref 7–25)
CHLORIDE: 107 mmol/L (ref 98–110)
CO2: 27 mmol/L (ref 20–32)
Calcium: 9.8 mg/dL (ref 8.6–10.4)
Creat: 0.9 mg/dL (ref 0.50–1.05)
GFR, EST AFRICAN AMERICAN: 82 mL/min/{1.73_m2} (ref >=60)
GFR, EST NON AFRICAN AMERICAN: 70 mL/min/{1.73_m2} (ref >=60)
GLOBULIN: 2 g/dL (ref 1.9–3.7)
GLUCOSE: 117 mg/dL — AB (ref 65–99)
Potassium: 4.2 mmol/L (ref 3.5–5.3)
SODIUM: 142 mmol/L (ref 135–146)
TOTAL PROTEIN: 6.5 g/dL (ref 6.1–8.1)
Total Bilirubin: 0.5 mg/dL (ref 0.2–1.2)

## 2018-02-07 LAB — MAGNESIUM: MAGNESIUM: 2.2 mg/dL (ref 1.5–2.5)

## 2018-02-07 LAB — INSULIN, RANDOM: Insulin: 51.2 u[IU]/mL — ABNORMAL HIGH (ref 2.0–19.6)

## 2018-02-07 LAB — TSH: TSH: 3.69 m[IU]/L (ref 0.40–4.50)

## 2018-02-09 ENCOUNTER — Encounter: Payer: Self-pay | Admitting: Internal Medicine

## 2018-02-22 ENCOUNTER — Other Ambulatory Visit: Payer: Self-pay | Admitting: Internal Medicine

## 2018-02-22 DIAGNOSIS — Z1231 Encounter for screening mammogram for malignant neoplasm of breast: Secondary | ICD-10-CM

## 2018-04-01 ENCOUNTER — Ambulatory Visit
Admission: RE | Admit: 2018-04-01 | Discharge: 2018-04-01 | Disposition: A | Discharge status: Home / Self Care | Payer: BLUE CROSS/BLUE SHIELD | Source: Ambulatory Visit | Attending: Internal Medicine | Admitting: Internal Medicine

## 2018-04-01 DIAGNOSIS — Z1231 Encounter for screening mammogram for malignant neoplasm of breast: Secondary | ICD-10-CM | POA: Diagnosis not present

## 2018-04-12 ENCOUNTER — Other Ambulatory Visit: Payer: Self-pay | Admitting: Adult Health

## 2018-04-12 ENCOUNTER — Other Ambulatory Visit: Payer: Self-pay | Admitting: Internal Medicine

## 2018-05-12 DIAGNOSIS — S50911A Unspecified superficial injury of right forearm, initial encounter: Secondary | ICD-10-CM | POA: Diagnosis not present

## 2018-05-12 DIAGNOSIS — S6991XA Unspecified injury of right wrist, hand and finger(s), initial encounter: Secondary | ICD-10-CM | POA: Diagnosis not present

## 2018-05-14 NOTE — Progress Notes (Signed)
FOLLOW UP  Assessment and Plan:   Asthma Well controlled on current regimen Continue meds, avoid triggers  Hypertension Fairly controlled on current regimen - start checking BPs at home Monitor blood pressure at home; patient to call if consistently greater than 130/80 Continue DASH diet.   Reminder to go to the ER if any CP, SOB, nausea, dizziness, severe HA, changes vision/speech, left arm numbness and tingling and jaw pain.  Cholesterol Currently near goal; continue statin therapy Continue low cholesterol diet and exercise.  Check lipid panel.   Other abnormal glucose Recent A1Cs at goal Discussed diet/exercise, weight management  Defer A1C; check CMP  Obesity with co morbidities Long discussion about weight loss, diet, and exercise Recommended diet heavy in fruits and veggies and low in animal meats, cheeses, and dairy products, appropriate calorie intake Discussed ideal weight for height (below 150lb) and initial weight goal (180lb) Patient will work on reducing stress eating, portion control Will follow up in 3 months  Vitamin D Def  At goal at last visit; continue supplementation to maintain goal of 70-100 Defer Vit D level  R elbow pain Likely just still quite bruised from recent fall, do not suspect any missed fractures/joint space involvement Advised to apply ice, continue stabilizing with brace, avoid excessive lifting/pulling for several days Take diclofenac regularly Follow up after the weekend - if not improving can refer to ortho for evaluation    Continue diet and meds as discussed. Further disposition pending results of labs. Discussed med's effects and SE's.   Over 30 minutes of exam, counseling, chart review, and critical decision making was performed.   Future Appointments  Date Time Provider [place]  08/27/2018  9:00 AM Unk Pinto, MD [facility] None     ----------------------------------------------------------------------------------------------------------------------  HPI 57 y.o. female  presents for 3 month follow up on hypertension, cholesterol, glucose management, obesity and vitamin D deficiency. She has asthma currently well controlled on Symbicort and Singulair.   She reports right arm pain; fell on Sunday, was seen at [facility] and xray showed no fracture. She was prescribed diclofenac sodium 50 mg TID, and has been bracing at night. She reports some ongoing limited ROM, can't fully extend elbow, hesitancy with any full rom. She is bruised and tender throughout.   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    BMI is Body mass index is 32.92 kg/m., she has been working on diet and exercise, but reports stressful few months and feels she has been stress eating. She is getting back on the next 56 days diet, goal to get back down to 180 lb.  Wt Readings from Last 3 Encounters:  05/15/18 191 lb 12.8 oz (87 kg)  02/06/18 192 lb 3.2 oz (87.2 kg)  11/07/17 189 lb (85.7 kg)   Her blood pressure has been controlled at home, today their BP is BP: 136/90  She does workout. She denies chest pain, shortness of breath, dizziness.   She is on cholesterol medication (pravastatin 40 mg daily) and denies myalgias. Her cholesterol is not at goal. The cholesterol last visit was:   Lab Results  Component Value Date   CHOL 185 02/06/2018   HDL 53 02/06/2018   LDLCALC 103 (H) 02/06/2018   TRIG 170 (H) 02/06/2018  CHOLHDL 3.5 02/06/2018    She has been working on diet and exercise for glucose management, and denies foot ulcerations, increased appetite, nausea, paresthesia of the feet, polydipsia, polyuria, visual  disturbances, vomiting and weight loss. Last A1C in the office was:  Lab Results  Component Value Date   HGBA1C 5.1 02/06/2018   Patient is on Vitamin D supplement and at goal at recent check:    Lab Results  Component Value Date   VD25OH 73 02/06/2018       Current Medications:  Current Outpatient Medications on File Prior to Visit  Medication Sig  . albuterol (PROVENTIL HFA;VENTOLIN HFA) 108 (90 BASE) MCG/ACT inhaler Inhale into the lungs every 6 (six) hours as needed for wheezing or shortness of breath.  Marland Kitchen aspirin 81 MG tablet Take 81 mg by mouth daily.  . budesonide-formoterol (SYMBICORT) 160-4.5 MCG/ACT inhaler Inhale 2 puffs into the lungs 2 (two) times daily.  . Cholecalciferol (VITAMIN D PO) Take 5,000 Units by mouth. Alternates 5000 and 10000 units  . levocetirizine (XYZAL) 5 MG tablet   . losartan (COZAAR) 100 MG tablet TAKE 1 TABLET BY MOUTH ONCE DAILY  . meloxicam (MOBIC) 15 MG tablet Take one daily with food for 2 weeks, can take with tylenol, can not take with aleve, iburpofen, then as needed daily for pain  . montelukast (SINGULAIR) 10 MG tablet Take 10 mg by mouth at bedtime.  . Multiple Vitamins-Minerals (MULTIVITAMIN PO) Take by mouth daily.  Marland Kitchen OVER THE COUNTER MEDICATION Bone strenght vitamin with Calcium and Magnesium  . pantoprazole (PROTONIX) 40 MG tablet TAKE ONE TABLET BY MOUTH EVERY DAY  . pravastatin (PRAVACHOL) 40 MG tablet TAKE ONE TABLET BY MOUTH AT BEDTIME FOR CHOLESTEROL   No current facility-administered medications on file prior to visit.      Allergies:  Allergies  Allergen Reactions  . Ace Inhibitors     cough  . Azithromycin     rash  . Prednisone     High dose  . Vicodin [Hydrocodone-Acetaminophen]     Nausea/vomiting     Medical History:  Past Medical History:  Diagnosis Date  . Allergy   . Asthma   . Hyperlipidemia   . Hypertension   . Migraine   . Obesity   . Prediabetes   . Vitamin D deficiency    Family history-  Reviewed and unchanged Social history- Reviewed and unchanged   Review of Systems:  Review of Systems  Constitutional: Negative for malaise/fatigue and weight loss.  HENT: Negative for hearing loss and tinnitus.   Eyes: Negative for blurred vision and double vision.  Respiratory: Negative for cough, shortness of breath and wheezing.   Cardiovascular: Negative for chest pain, palpitations, orthopnea, claudication and leg swelling.  Gastrointestinal: Negative for abdominal pain, blood in stool, constipation, diarrhea, heartburn, melena, nausea and vomiting.  Genitourinary: Negative.   Musculoskeletal: Positive for falls (mechanical) and joint pain (right elbow). Negative for myalgias and neck pain.  Skin: Negative for rash.  Neurological: Negative for dizziness, tingling, sensory change, weakness and headaches.  Endo/Heme/Allergies: Negative for polydipsia.  Psychiatric/Behavioral: Negative.   All other systems reviewed and are negative.   Physical Exam: BP 136/90   Pulse 87   Temp 97.7 F (36.5 C)   Ht 5\' 4"  (1.626 m)   Wt 191 lb 12.8 oz (87 kg)   SpO2 97%   BMI 32.92 kg/m  Wt Readings from Last 3 Encounters:  05/15/18 191 lb 12.8 oz (87 kg)  02/06/18 192  lb 3.2 oz (87.2 kg)  11/07/17 189 lb (85.7 kg)   General Appearance: Well nourished, in no apparent distress. Eyes: PERRLA, EOMs, conjunctiva no swelling or erythema Sinuses: No Frontal/maxillary tenderness ENT/Mouth: Ext aud canals clear, TMs without erythema, bulging. No erythema, swelling, or exudate on post pharynx.  Tonsils not swollen or erythematous. Hearing normal.  Neck: Supple, thyroid normal.  Respiratory: Respiratory effort normal, BS equal bilaterally without rales, rhonchi, wheezing or stridor.  Cardio: RRR with no MRGs. Brisk peripheral pulses without edema.  Abdomen: Soft, + BS.  Non tender, no guarding, rebound, hernias, masses. Lymphatics: Non tender without lymphadenopathy.  Musculoskeletal: R elbow  with slightly limited extension, rotation internally and externally with poorly localized tenderness, some muscle spasms with active rom, no hematoma, palpable tears/bulging, hematoma, Normal gait.   Skin: Warm, dry without rashes, lesions, ecchymosis.  Neuro: Cranial nerves intact. No cerebellar symptoms.  Psych: Awake and oriented X 3, normal affect, Insight and Judgment appropriate.    [name], NP 8:54 AM [facility]

## 2018-05-15 ENCOUNTER — Ambulatory Visit: Payer: BLUE CROSS/BLUE SHIELD | Admitting: Adult Health

## 2018-05-15 ENCOUNTER — Encounter: Payer: Self-pay | Admitting: Adult Health

## 2018-05-15 VITALS — BP 132/86 | HR 87 | Temp 97.7°F | Ht 64.0 in | Wt 191.8 lb

## 2018-05-15 DIAGNOSIS — J45909 Unspecified asthma, uncomplicated: Secondary | ICD-10-CM

## 2018-05-15 DIAGNOSIS — I10 Essential (primary) hypertension: Secondary | ICD-10-CM | POA: Diagnosis not present

## 2018-05-15 DIAGNOSIS — R7309 Other abnormal glucose: Secondary | ICD-10-CM

## 2018-05-15 DIAGNOSIS — Z79899 Other long term (current) drug therapy: Secondary | ICD-10-CM | POA: Diagnosis not present

## 2018-05-15 DIAGNOSIS — E782 Mixed hyperlipidemia: Secondary | ICD-10-CM

## 2018-05-15 DIAGNOSIS — E559 Vitamin D deficiency, unspecified: Secondary | ICD-10-CM

## 2018-05-15 DIAGNOSIS — E669 Obesity, unspecified: Secondary | ICD-10-CM

## 2018-05-15 NOTE — Patient Instructions (Signed)
Goals    . Blood Pressure < 130/80    . LDL CALC < 100    . Weight (lb) < 180 lb (81.6 kg)        Drink 1/2 your body weight in fluid ounces of water daily; drink a tall glass of water 30 min before meals  Don't eat until you're stuffed- listen to your stomach and eat until you are 80% full   Try eating off of a salad plate; wait 10 min after finishing before going back for seconds  Start by eating the vegetables on your plate; aim for 50% of your meals to be fruits or vegetables  Then eat your protein - lean meats (grass fed if possible), fish, beans, nuts in moderation  Eat your carbs/starch last ONLY if you still are hungry. If you can, stop before finishing it all  Avoid sugar and flour - the closer it looks to it's original form in nature, typically the better it is for you  Splurge in moderation - "assign" days when you get to splurge and have the "bad stuff" - I like to follow a 80% - 20% plan- "good" choices 80 % of the time, "bad" choices in moderation 20% of the time  Simple equation is: Calories out > calories in = weight loss - even if you eat the bad stuff, if you limit portions, you will still lose weight    Know what a healthy weight is for you (roughly BMI <25) and aim to maintain this  Aim for 7+ servings of fruits and vegetables daily  65-80+ fluid ounces of water or unsweet tea for healthy kidneys  Limit to max 1 drink of alcohol per day; avoid smoking/tobacco  Limit animal fats in diet for cholesterol and heart health - choose grass fed whenever available  Avoid highly processed foods, and foods high in saturated/trans fats  Aim for low stress - take time to unwind and care for your mental health  Aim for 150 min of moderate intensity exercise weekly for heart health, and weights twice weekly for bone health  Aim for 7-9 hours of sleep daily

## 2018-05-16 LAB — CBC WITH DIFFERENTIAL/PLATELET
BASOS PCT: 1.3 %
Basophils Absolute: 90 cells/uL (ref 0–200)
Eosinophils Absolute: 186 cells/uL (ref 15–500)
Eosinophils Relative: 2.7 %
HCT: 40 % (ref 35.0–45.0)
Hemoglobin: 13.7 g/dL (ref 11.7–15.5)
Lymphs Abs: 1753 cells/uL (ref 850–3900)
MCH: 30.8 pg (ref 27.0–33.0)
MCHC: 34.3 g/dL (ref 32.0–36.0)
MCV: 89.9 fL (ref 80.0–100.0)
MONOS PCT: 7.4 %
MPV: 11.1 fL (ref 7.5–12.5)
Neutro Abs: 4361 cells/uL (ref 1500–7800)
Neutrophils Relative %: 63.2 %
PLATELETS: 260 10*3/uL (ref 140–400)
RBC: 4.45 10*6/uL (ref 3.80–5.10)
RDW: 12.7 % (ref 11.0–15.0)
TOTAL LYMPHOCYTE: 25.4 %
WBC: 6.9 10*3/uL (ref 3.8–10.8)
WBCMIX: 511 {cells}/uL (ref 200–950)

## 2018-05-16 LAB — COMPLETE METABOLIC PANEL WITH GFR
AG RATIO: 2.4 (calc) (ref 1.0–2.5)
ALKALINE PHOSPHATASE (APISO): 88 U/L (ref 33–130)
ALT: 23 U/L (ref 6–29)
AST: 18 U/L (ref 10–35)
Albumin: 4.4 g/dL (ref 3.6–5.1)
BUN: 18 mg/dL (ref 7–25)
CO2: 30 mmol/L (ref 20–32)
CREATININE: 0.86 mg/dL (ref 0.50–1.05)
Calcium: 10 mg/dL (ref 8.6–10.4)
Chloride: 106 mmol/L (ref 98–110)
GFR, Est African American: 86 mL/min/{1.73_m2} (ref >=60)
GFR, Est Non African American: 74 mL/min/{1.73_m2} (ref >=60)
Globulin: 1.8 g/dL (calc) — ABNORMAL LOW (ref 1.9–3.7)
Glucose, Bld: 84 mg/dL (ref 65–99)
POTASSIUM: 4.4 mmol/L (ref 3.5–5.3)
Sodium: 143 mmol/L (ref 135–146)
Total Bilirubin: 0.4 mg/dL (ref 0.2–1.2)
Total Protein: 6.2 g/dL (ref 6.1–8.1)

## 2018-05-16 LAB — LIPID PANEL
CHOLESTEROL: 180 mg/dL (ref <200)
HDL: 46 mg/dL — ABNORMAL LOW (ref >50)
LDL Cholesterol (Calc): 105 mg/dL (calc) — ABNORMAL HIGH
Non-HDL Cholesterol (Calc): 134 mg/dL (calc) — ABNORMAL HIGH (ref <130)
Total CHOL/HDL Ratio: 3.9 (calc) (ref <5.0)
Triglycerides: 169 mg/dL — ABNORMAL HIGH (ref <150)

## 2018-05-16 LAB — MAGNESIUM: MAGNESIUM: 2.3 mg/dL (ref 1.5–2.5)

## 2018-05-16 LAB — TSH: TSH: 3.31 m[IU]/L (ref 0.40–4.50)

## 2018-05-20 ENCOUNTER — Telehealth: Payer: Self-pay

## 2018-05-20 ENCOUNTER — Other Ambulatory Visit: Payer: Self-pay | Admitting: Adult Health

## 2018-05-20 DIAGNOSIS — M79601 Pain in right arm: Secondary | ICD-10-CM

## 2018-05-20 DIAGNOSIS — M542 Cervicalgia: Secondary | ICD-10-CM

## 2018-05-20 MED ORDER — DICLOFENAC POTASSIUM 50 MG PO TABS: 50.0000 mg | ORAL_TABLET | Freq: Three times a day (TID) | ORAL | 0 refills | Status: DC | PRN

## 2018-05-20 NOTE — Progress Notes (Signed)
R arm pain following fall; was seen at [facility] with xray negative. Exam in PCP office non-specific and limited due to soft tissue pain; pt was advised to continue NSAID as prescribed, rest, monitor. She is now requesting refill and referral to ortho for evaluation as symptoms not improving, which will be provided.

## 2018-05-20 NOTE — Telephone Encounter (Signed)
Refill request for Diclofenac. Also, patient states that her arm is still hurting, would like to know if she should get a referral to see Ortho?

## 2018-07-01 ENCOUNTER — Other Ambulatory Visit: Payer: Self-pay | Admitting: Internal Medicine

## 2018-07-02 ENCOUNTER — Encounter: Payer: Self-pay | Admitting: Adult Health Nurse Practitioner

## 2018-07-02 ENCOUNTER — Ambulatory Visit: Payer: BLUE CROSS/BLUE SHIELD | Admitting: Adult Health Nurse Practitioner

## 2018-07-02 VITALS — BP 124/80 | HR 86 | Temp 98.0°F | Ht 64.0 in | Wt 192.0 lb

## 2018-07-02 DIAGNOSIS — J45901 Unspecified asthma with (acute) exacerbation: Secondary | ICD-10-CM | POA: Diagnosis not present

## 2018-07-02 DIAGNOSIS — R05 Cough: Secondary | ICD-10-CM | POA: Diagnosis not present

## 2018-07-02 DIAGNOSIS — L309 Dermatitis, unspecified: Secondary | ICD-10-CM | POA: Diagnosis not present

## 2018-07-02 DIAGNOSIS — J029 Acute pharyngitis, unspecified: Secondary | ICD-10-CM

## 2018-07-02 DIAGNOSIS — R059 Cough, unspecified: Secondary | ICD-10-CM

## 2018-07-02 MED ORDER — TRIAMCINOLONE ACETONIDE 0.1 % EX OINT: 1.0000 "application " | TOPICAL_OINTMENT | Freq: Two times a day (BID) | CUTANEOUS | 1 refills | Status: DC

## 2018-07-02 MED ORDER — AMOXICILLIN-POT CLAVULANATE 875-125 MG PO TABS: 1.0000 | ORAL_TABLET | Freq: Two times a day (BID) | ORAL | 0 refills | Status: DC

## 2018-07-02 MED ORDER — BENZONATATE 100 MG PO CAPS: 200.0000 mg | ORAL_CAPSULE | Freq: Three times a day (TID) | ORAL | 0 refills | Status: DC | PRN

## 2018-07-02 NOTE — Patient Instructions (Signed)
Cough: Continue Mucinex DM every 12 hours while having symptoms We will sent in Tessalon Pearls to take, one every 8hours as needed for cough  Continue to take your Xyzal and singular.  You can try changing Xyzal to Zyrtec or Allegra to see if this improves your allergy symptoms.  Sore throat:  Drink warm or cold liquids.  Gargle with salt water and spit out.  You can also use Cepacal lozenges.  We have sent in Augmentin, antibiotic, take twice a day and complete full course of medication Monitor for yeast symptoms Contact office with any new or worsening symptoms

## 2018-07-02 NOTE — Progress Notes (Signed)
Assessment and Plan:   Moderate Asthma w exacerbation Continue PRN Albuterol Q4-6hours , check expatriation date Continue Symbicort BID, rinse mouth after each use Rx Augmentin BID x10days Monitor for yeast symptoms and contatc office  Cough: Continue mucinex DM Q 12hours while having symptoms Education provided that the same ingredient in delsym is in Mucinex DM, but tolerated well by patient. Rx Tessalon Pearl Q8 PRN for cough.  Sore Throat: Drink warm / cold liquids Gargle with salt water, spit out Use OTC Cepacol lozenges Continue OTC xyzal and singular.  Discussed consider changing Xyzal to Zyrtec or Allegra for allergy symptoms, decrease throat soreness.  Eczema: Continue triamcinolone topical with flares Avoid hot showers Keep skin moisturized daily Use gentle soaps like dove, aveeno, avoid dial or harsh   Call or return with new or worsening symptoms as discussed in appointment.  May contact via office phone [phone] or via [place].     Over 30 minutes of exam, counseling, chart review, and critical decision making was performed.   Future Appointments  Date Time Provider [place]  07/02/2018  4:00 PM Garnet Sierras, NP [facility] None  08/27/2018  9:00 AM Unk Pinto, MD [facility] None    --------------------------------------------------------------------------------------------------------   HPI 57 y.o.female presents foruri symptoms that started one week ago.  It started with a sore throat so she treated her allergies but has not reduced her symptoms.  The sore throat comes and goes.  She has nasal congestion with left ear felling full.  Reports that she has a cough that is productive.  Reports the cough is worse in the morning when she first gets up and also when she lays down at night.  Reports that her cough wakes her up in the night.  She has been taking singular and xyzal regularly.  She has not taken anything for the cough.  Report  that OTC cough medicine makes her jittery and she prefers not to take it.  She also reports that she needs a refill of triamcinolone that she uses  Past Medical History:  Diagnosis Date  . Allergy   . Asthma   . Hyperlipidemia   . Hypertension   . Migraine   . Obesity   . Prediabetes   . Vitamin D deficiency      Allergies  Allergen Reactions  . Ace Inhibitors     cough  . Azithromycin     rash  . Prednisone     High dose  . Vicodin [Hydrocodone-Acetaminophen]     Nausea/vomiting    Current Outpatient Medications on File Prior to Visit  Medication Sig  . albuterol (PROVENTIL HFA;VENTOLIN HFA) 108 (90 BASE) MCG/ACT inhaler Inhale into the lungs every 6 (six) hours as needed for wheezing or shortness of breath.  Marland Kitchen aspirin 81 MG tablet Take 81 mg by mouth daily.  . budesonide-formoterol (SYMBICORT) 160-4.5 MCG/ACT inhaler Inhale 2 puffs into the lungs 2 (two) times daily.  . Cholecalciferol (VITAMIN D PO) Take 5,000 Units by mouth. Alternates 5000 and 10000 units  . diclofenac (CATAFLAM) 50 MG tablet Take 1 tablet (50 mg total) by mouth 3 (three) times daily as needed.  Marland Kitchen levocetirizine (XYZAL) 5 MG tablet   . losartan (COZAAR) 100 MG tablet TAKE 1 TABLET BY MOUTH ONCE DAILY  . montelukast (SINGULAIR) 10 MG tablet Take 10 mg by mouth at bedtime.  . Multiple Vitamins-Minerals (MULTIVITAMIN PO) Take by mouth daily.  Marland Kitchen OVER THE COUNTER MEDICATION Bone strenght vitamin with Calcium and Magnesium  .  pantoprazole (PROTONIX) 40 MG tablet TAKE ONE TABLET BY MOUTH EVERY DAY  . pravastatin (PRAVACHOL) 40 MG tablet TAKE ONE TABLET BY MOUTH AT BEDTIME FOR CHOLESTEROL   No current facility-administered medications on file prior to visit.     ROS: Review of Systems  Constitutional: Negative for chills, diaphoresis, fever, malaise/fatigue and weight loss.  HENT: Positive for congestion and sore throat. Negative for ear discharge, ear pain, hearing loss, nosebleeds, sinus pain and  tinnitus.        Ear fullness, left  Eyes: Negative for blurred vision, double vision, photophobia, pain, discharge and redness.  Respiratory: Positive for cough, sputum production and shortness of breath. Negative for stridor.   Cardiovascular: Negative for chest pain, palpitations, orthopnea, claudication, leg swelling and PND.  Gastrointestinal: Negative for abdominal pain, heartburn, nausea and vomiting.  Genitourinary: Negative for dysuria, flank pain, frequency, hematuria and urgency.  Skin: Positive for itching and rash.     Physical Exam:  There were no vitals taken for this visit.  General Appearance: Well nourished, in no apparent distress. Eyes: PERRLA, EOMs, conjunctiva no swelling or erythema Sinuses: No Frontal/maxillary tenderness ENT/Mouth: Ext aud canals clear, TMs without erythema, bulging. Serous noted behind bilateral TM's No erythema, swelling, or exudate on post pharynx.  Tonsils not swollen or erythematous. Hearing normal.  Neck: Supple, thyroid normal.  Respiratory: Respiratory effort normal,  Rhonchi noted bilateral with wheezing to RLL. BS bilaterally without rales, or stridor.  Cardio: RRR with no MRGs. Brisk peripheral pulses without edema.  Abdomen: Soft, + BS.  Non tender, no guarding, rebound, hernias, masses. Lymphatics: Non tender without lymphadenopathy.  Musculoskeletal: Full ROM, 5/5 strength, normal gait.  Skin: Warm, dry without rashes, lesions, ecchymosis.  Neuro: Cranial nerves intact. Normal muscle tone, no cerebellar symptoms. Sensation intact.  Psych: Awake and oriented X 3, normal affect, Insight and Judgment appropriate.     Garnet Sierras, NP 3:02 PM [facility]

## 2018-07-03 ENCOUNTER — Other Ambulatory Visit: Payer: Self-pay | Admitting: Adult Health

## 2018-07-03 DIAGNOSIS — M79601 Pain in right arm: Secondary | ICD-10-CM

## 2018-07-08 ENCOUNTER — Encounter: Payer: Self-pay | Admitting: Adult Health Nurse Practitioner

## 2018-08-06 ENCOUNTER — Telehealth: Payer: Self-pay

## 2018-08-06 NOTE — Telephone Encounter (Signed)
Patient complaining of sore throat, congestion, light yellow mucus, and headache. Requesting a  Prescription.

## 2018-08-08 NOTE — Telephone Encounter (Signed)
Patient states that she is taking Mucinex DM and the tessalon perles. Has the same symptoms that she had before, advised her to schedule an office visit but patient declined at this time. Will wait to see if she feels better first before making an appointment

## 2018-08-26 ENCOUNTER — Encounter: Payer: Self-pay | Admitting: Internal Medicine

## 2018-08-26 NOTE — Patient Instructions (Signed)

## 2018-08-26 NOTE — Progress Notes (Signed)
[facility] Unk Pinto, M.D.     Uvaldo Bristle. Silverio Lay, P.A.-C Liane Comber, [place], [place] [address] Telephone [phone] Telefax [phone] Annual Screening/Preventative Visit & Comprehensive Evaluation &  Examination     This very nice 58 y.o. DWF presents for a Screening /Preventative Visit & comprehensive evaluation and management of multiple medical co-morbidities.  Patient has been followed for HTN, HLD, Prediabetes  and Vitamin D Deficiency.  She also relates concern re thickening and discoloration of several toenails.       HTN predates circa 2007. Patient's BP has been controlled at home and patient denies any cardiac symptoms as chest pain, palpitations, shortness of breath, dizziness or ankle swelling. Today's BP is at goal - 128/72.      Patient's hyperlipidemia is near controlled with diet and medications. Patient denies myalgias or other medication SE's. Last lipids were near goal: Lab Results  Component Value Date   CHOL 180 05/15/2018   HDL 46 (L) 05/15/2018   LDLCALC 105 (H) 05/15/2018   TRIG 169 (H) 05/15/2018   CHOLHDL 3.9 05/15/2018      Patient has Morbid Obesity (BMI 44) &  hx/o prediabetes (A1c 5.7%, Insulin 37 /2012)  and patient denies reactive hypoglycemic symptoms, visual blurring, diabetic polys or paresthesias. Last A1c was Normal & at goal: Lab Results  Component Value Date   HGBA1C 5.1 02/06/2018      Finally, patient has history of Vitamin D Deficiency ("22" / 2008)  and last Vitamin D was at goal: Lab Results  Component Value Date   VD25OH 73 02/06/2018   Current Outpatient Medications on File Prior to Visit  Medication Sig  . albuterol (PROVENTIL HFA;VENTOLIN HFA) 108 (90 BASE) MCG/ACT inhaler Inhale into the lungs every 6 (six) hours as needed for wheezing or shortness of breath.  Marland Kitchen aspirin 81 MG tablet Take 81 mg by mouth daily.  .  budesonide-formoterol (SYMBICORT) 160-4.5 MCG/ACT inhaler Inhale 2 puffs into the lungs 2 (two) times daily.  . Cholecalciferol (VITAMIN D PO) Take 5,000 Units by mouth. Alternates 5000 and 10000 units  . diclofenac (CATAFLAM) 50 MG tablet TAKE ONE TABLET BY MOUTH 3 TIMES A DAY AS NEEDED  . levocetirizine (XYZAL) 5 MG tablet   . losartan (COZAAR) 100 MG tablet TAKE 1 TABLET BY MOUTH ONCE DAILY  . montelukast (SINGULAIR) 10 MG tablet Take 10 mg by mouth at bedtime.  . Multiple Vitamins-Minerals (MULTIVITAMIN PO) Take by mouth daily.  Marland Kitchen OVER THE COUNTER MEDICATION Bone strenght vitamin with Calcium and Magnesium  . pantoprazole (PROTONIX) 40 MG tablet TAKE ONE TABLET BY MOUTH EVERY DAY  . pravastatin (PRAVACHOL) 40 MG tablet TAKE ONE TABLET BY MOUTH AT BEDTIME FOR CHOLESTEROL  . triamcinolone ointment (KENALOG) 0.1 % Apply 1 application topically 2 (two) times daily.   No current facility-administered medications on file prior to visit.    Allergies  Allergen Reactions  . Ace Inhibitors     cough  . Azithromycin     rash  . Prednisone     High dose  . Vicodin [Hydrocodone-Acetaminophen]     Nausea/vomiting   Past Medical History:  Diagnosis Date  . Allergy   . Asthma   . Hyperlipidemia   . Hypertension   . Migraine   . Obesity   . Prediabetes   . Vitamin D deficiency    Health Maintenance  Topic Date Due  . Hepatitis C Screening  1961-06-28  . HIV Screening  05/31/1974  . PAP SMEAR-Modifier  12/27/2012  . TETANUS/TDAP  08/29/2017  . INFLUENZA VACCINE  03/07/2018  . MAMMOGRAM  04/01/2020  . COLONOSCOPY  04/03/2026   Immunization History  Administered Date(s) Administered  . PPD Test 05/12/2014, 05/25/2015, 07/05/2016, 08/03/2017  . Pneumococcal-Unspecified 08/29/2009  . Tdap 08/30/2007   Last Colon - 04/03/2016 - Dr Rob't Melina Copa, [place] 5 yr f/u due Aug 2022  Last MGM - 04/01/2018  Past Surgical History:  Procedure Laterality Date  . DILATION AND  CURETTAGE, DIAGNOSTIC / THERAPEUTIC  2009  . LYMPH NODE BIOPSY Left 1991   negative  . NEVUS EXCISION  2012   dysplastic from back   Family History  Problem Relation Age of Onset  . Hypertension Mother   . COPD Father   . Cancer Father        colon  . Heart disease Father   . Hyperlipidemia Father   . Hypertension Father   . Stroke Father    Social History   Tobacco Use  . Smoking status: Never Smoker  . Smokeless tobacco: Never Used  Substance Use Topics  . Alcohol use: No  . Drug use: No    ROS Constitutional: Denies fever, chills, weight loss/gain, headaches, insomnia,  night sweats, and change in appetite. Does c/o fatigue. Eyes: Denies redness, blurred vision, diplopia, discharge, itchy, watery eyes.  ENT: Denies discharge, congestion, post nasal drip, epistaxis, sore throat, earache, hearing loss, dental pain, Tinnitus, Vertigo, Sinus pain, snoring.  Cardio: Denies chest pain, palpitations, irregular heartbeat, syncope, dyspnea, diaphoresis, orthopnea, PND, claudication, edema Respiratory: denies cough, dyspnea, DOE, pleurisy, hoarseness, laryngitis, wheezing.  Gastrointestinal: Denies dysphagia, heartburn, reflux, water brash, pain, cramps, nausea, vomiting, bloating, diarrhea, constipation, hematemesis, melena, hematochezia, jaundice, hemorrhoids Genitourinary: Denies dysuria, frequency, urgency, nocturia, hesitancy, discharge, hematuria, flank pain Breast: Breast lumps, nipple discharge, bleeding.  Musculoskeletal: Denies arthralgia, myalgia, stiffness, Jt. Swelling, pain, limp, and strain/sprain. Denies falls. Skin: Denies puritis, rash, hives, warts, acne, eczema, changing in skin lesion Neuro: No weakness, tremor, incoordination, spasms, paresthesia, pain Psychiatric: Denies confusion, memory loss, sensory loss. Denies Depression. Endocrine: Denies change in weight, skin, hair change, nocturia, and paresthesia, diabetic polys, visual blurring, hyper / hypo glycemic  episodes.  Heme/Lymph: No excessive bleeding, bruising, enlarged lymph nodes.  Physical Exam  BP 128/72   Pulse 79   Temp (!) 97.4 F (36.3 C)   Ht 5' 3.5" (1.613 m)   Wt 193 lb 6.4 oz (87.7 kg)   SpO2 99%   BMI 33.72 kg/m   General Appearance: Over  nourished, well groomed and in no apparent distress.  Eyes: PERRLA, EOMs, conjunctiva no swelling or erythema, normal fundi and vessels. Sinuses: No frontal/maxillary tenderness ENT/Mouth: EACs patent / TMs  nl. Nares clear without erythema, swelling, mucoid exudates. Oral hygiene is good. No erythema, swelling, or exudate. Tongue normal, non-obstructing. Tonsils not swollen or erythematous. Hearing normal.  Neck: Supple, thyroid not palpable. No bruits, nodes or JVD. Respiratory: Respiratory effort normal.  BS equal and clear bilateral without rales, rhonci, wheezing or stridor. Cardio: Heart sounds are normal with regular rate and rhythm and no murmurs, rubs or gallops. Peripheral pulses are normal and equal bilaterally without edema. No aortic or femoral bruits. Chest: symmetric with normal excursions and percussion. Breasts: Symmetric, without lumps, nipple discharge, retractions, or fibrocystic changes.  Abdomen: Flat, soft with bowel sounds active. Nontender, no guarding, rebound, hernias, masses, or organomegaly.  Lymphatics: Non tender without lymphadenopathy.  Musculoskeletal: Full ROM all peripheral extremities, joint stability, 5/5 strength, and normal gait. Skin: Warm and dry without rashes, lesions, cyanosis, clubbing or  ecchymosis. Several toenails of both feet show typical findings of fungal nail infection with thickening, chalky and yellowish discoloration.  Neuro: Cranial nerves intact, reflexes equal bilaterally. Normal muscle tone, no cerebellar symptoms. Sensation intact.  Pysch: Alert and oriented X 3, normal affect, Insight and Judgment appropriate.   Assessment and Plan  1. Annual Preventative Screening  Examination  3. Essential hypertension  - EKG 12-Lead - [place], RETROPERITNL ABD,  LTD - Urinalysis, Routine w reflex microscopic - Microalbumin / creatinine urine ratio - CBC with Differential/Platelet - COMPLETE METABOLIC PANEL WITH GFR - Magnesium - TSH  4. Hyperlipidemia, mixed  - EKG 12-Lead - [place], RETROPERITNL ABD,  LTD - Lipid panel - TSH  5. Prediabetes  - EKG 12-Lead - [place], RETROPERITNL ABD,  LTD - Hemoglobin A1c - Insulin, random  6. Vitamin D deficiency  - VITAMIN D 25 Hydroxyl  7. Gastroesophageal reflux disease  - CBC with Differential/Platelet  8. Obesity (BMI 30.0-34.9)  - TSH - Hemoglobin A1c  9. Screening for colorectal cancer  - POC Hemoccult Bld/Stl  10. Screening examination for pulmonary tuberculosis  - TB Skin Test  11. Screening for AAA (aortic abdominal aneurysm)  - [place], RETROPERITNL ABD,  LTD  12. FHx: heart disease  - EKG 12-Lead - [place], RETROPERITNL ABD,  LTD  13. Screening for ischemic heart disease  - EKG 12-Lead  14. Fatigue  - Iron,Total/Total Iron Binding Cap - Vitamin B12 - CBC with Differential/Platelet - TSH  15. Medication management  - Urinalysis, Routine w reflex microscopic - Microalbumin / creatinine urine ratio - CBC with Differential/Platelet - COMPLETE METABOLIC PANEL WITH GFR - Magnesium - Lipid panel - TSH - Hemoglobin A1c - Insulin, random - VITAMIN D 25 Hydroxyl        Patient was counseled in prudent diet to achieve/maintain BMI less than 25 for weight control, BP monitoring, regular exercise and medications. Discussed med's effects and SE's. Screening labs and tests as requested with regular follow-up as recommended. Over 40 minutes of exam, counseling, chart review and high complex critical decision making was performed.

## 2018-08-26 NOTE — Progress Notes (Signed)
[facility] Unk Pinto, M.D.     Uvaldo Bristle. Silverio Lay, P.A.-C Liane Comber, [place], [place] [address] Telephone [phone] Telefax [phone] Annual Screening/Preventative Visit & Comprehensive Evaluation &  Examination     This very nice 57 y.o.female presents for a Screening /Preventative Visit & comprehensive evaluation and management of multiple medical co-morbidities.  Patient has been followed for HTN, HLD, T2_NIDDM  Prediabetes  and Vitamin D Deficiency.      HTN predates since     . Patient's BP has been controlled at home and patient denies any cardiac symptoms as chest pain, palpitations, shortness of breath, dizziness or ankle swelling. Today's        Patient's hyperlipidemia is controlled with diet and medications. Patient denies myalgias or other medication SE's. Last lipids were  Lab Results  Component Value Date   CHOL 180 05/15/2018   HDL 46 (L) 05/15/2018   LDLCALC 105 (H) 05/15/2018   TRIG 169 (H) 05/15/2018   CHOLHDL 3.9 05/15/2018      Patient has hx/o T2_NIDDM prediabetes predating since      and patient denies reactive hypoglycemic symptoms, visual blurring, diabetic polys or paresthesias. Last A1c was  Lab Results  Component Value Date   HGBA1C 5.1 02/06/2018      Finally, patient has history of Vitamin D Deficiency and last Vitamin D was  Lab Results  Component Value Date   VD25OH 73 02/06/2018   Current Outpatient Medications on File Prior to Visit  Medication Sig  . albuterol (PROVENTIL HFA;VENTOLIN HFA) 108 (90 BASE) MCG/ACT inhaler Inhale into the lungs every 6 (six) hours as needed for wheezing or shortness of breath.  Marland Kitchen amoxicillin-clavulanate (AUGMENTIN) 875-125 MG tablet Take 1 tablet by mouth 2 (two) times daily. 10 days  . aspirin 81 MG tablet Take 81 mg by mouth daily.  . benzonatate (TESSALON PERLES) 100 MG capsule Take 2 capsules (200  mg total) by mouth 3 (three) times daily as needed for cough (Max: 600mg  per day).  . budesonide-formoterol (SYMBICORT) 160-4.5 MCG/ACT inhaler Inhale 2 puffs into the lungs 2 (two) times daily.  . Cholecalciferol (VITAMIN D PO) Take 5,000 Units by mouth. Alternates 5000 and 10000 units  . diclofenac (CATAFLAM) 50 MG tablet TAKE ONE TABLET BY MOUTH 3 TIMES A DAY AS NEEDED  . levocetirizine (XYZAL) 5 MG tablet   . losartan (COZAAR) 100 MG tablet TAKE 1 TABLET BY MOUTH ONCE DAILY  . montelukast (SINGULAIR) 10 MG tablet Take 10 mg by mouth at bedtime.  . Multiple Vitamins-Minerals (MULTIVITAMIN PO) Take by mouth daily.  Marland Kitchen OVER THE COUNTER MEDICATION Bone strenght vitamin with Calcium and Magnesium  . pantoprazole (PROTONIX) 40 MG tablet TAKE ONE TABLET BY MOUTH EVERY DAY  . pravastatin (PRAVACHOL) 40 MG tablet TAKE ONE TABLET BY MOUTH AT BEDTIME FOR CHOLESTEROL  . triamcinolone ointment (KENALOG) 0.1 % Apply 1 application topically 2 (two) times daily.   No current facility-administered medications on file prior to visit.    Allergies  Allergen Reactions  . Ace Inhibitors     cough  . Azithromycin     rash  . Prednisone     High dose  . Vicodin [Hydrocodone-Acetaminophen]     Nausea/vomiting   Past Medical History:  Diagnosis Date  . Allergy   . Asthma   . Hyperlipidemia   . Hypertension   . Migraine   . Obesity   . Prediabetes   .  Vitamin D deficiency    Health Maintenance  Topic Date Due  . Hepatitis C Screening  [birth year]  . HIV Screening  05/31/1974  . PAP SMEAR-Modifier  12/27/2012  . TETANUS/TDAP  08/29/2017  . INFLUENZA VACCINE  03/07/2018  . MAMMOGRAM  04/01/2020  . COLONOSCOPY  04/03/2026   Immunization History  Administered Date(s) Administered  . PPD Test 05/12/2014, 05/25/2015, 07/05/2016, 08/03/2017  . Pneumococcal-Unspecified 08/29/2009  . Tdap 08/30/2007   Last Colon -  Last MGM -  Past Surgical History:  Procedure Laterality Date  . DILATION AND  CURETTAGE, DIAGNOSTIC / THERAPEUTIC  2009  . LYMPH NODE BIOPSY Left 1991   negative  . NEVUS EXCISION  2012   dysplastic from back   Family History  Problem Relation Age of Onset  . Hypertension Mother   . COPD Father   . Cancer Father        colon  . Heart disease Father   . Hyperlipidemia Father   . Hypertension Father   . Stroke Father    Social History   Tobacco Use  . Smoking status: Never Smoker  . Smokeless tobacco: Never Used  Substance Use Topics  . Alcohol use: No  . Drug use: No    ROS Constitutional: Denies fever, chills, weight loss/gain, headaches, insomnia,  night sweats, and change in appetite. Does c/o fatigue. Eyes: Denies redness, blurred vision, diplopia, discharge, itchy, watery eyes.  ENT: Denies discharge, congestion, post nasal drip, epistaxis, sore throat, earache, hearing loss, dental pain, Tinnitus, Vertigo, Sinus pain, snoring.  Cardio: Denies chest pain, palpitations, irregular heartbeat, syncope, dyspnea, diaphoresis, orthopnea, PND, claudication, edema Respiratory: denies cough, dyspnea, DOE, pleurisy, hoarseness, laryngitis, wheezing.  Gastrointestinal: Denies dysphagia, heartburn, reflux, water brash, pain, cramps, nausea, vomiting, bloating, diarrhea, constipation, hematemesis, melena, hematochezia, jaundice, hemorrhoids Genitourinary: Denies dysuria, frequency, urgency, nocturia, hesitancy, discharge, hematuria, flank pain Breast: Breast lumps, nipple discharge, bleeding.  Musculoskeletal: Denies arthralgia, myalgia, stiffness, Jt. Swelling, pain, limp, and strain/sprain. Denies falls. Skin: Denies puritis, rash, hives, warts, acne, eczema, changing in skin lesion Neuro: No weakness, tremor, incoordination, spasms, paresthesia, pain Psychiatric: Denies confusion, memory loss, sensory loss. Denies Depression. Endocrine: Denies change in weight, skin, hair change, nocturia, and paresthesia, diabetic polys, visual blurring, hyper / hypo glycemic  episodes.  Heme/Lymph: No excessive bleeding, bruising, enlarged lymph nodes.  Physical Exam  There were no vitals taken for this visit.  General Appearance: Well nourished, well groomed and in no apparent distress.  Eyes: PERRLA, EOMs, conjunctiva no swelling or erythema, normal fundi and vessels. Sinuses: No frontal/maxillary tenderness ENT/Mouth: EACs patent / TMs  nl. Nares clear without erythema, swelling, mucoid exudates. Oral hygiene is good. No erythema, swelling, or exudate. Tongue normal, non-obstructing. Tonsils not swollen or erythematous. Hearing normal.  Neck: Supple, thyroid not palpable. No bruits, nodes or JVD. Respiratory: Respiratory effort normal.  BS equal and clear bilateral without rales, rhonci, wheezing or stridor. Cardio: Heart sounds are normal with regular rate and rhythm and no murmurs, rubs or gallops. Peripheral pulses are normal and equal bilaterally without edema. No aortic or femoral bruits. Chest: symmetric with normal excursions and percussion. Breasts: Symmetric, without lumps, nipple discharge, retractions, or fibrocystic changes.  Abdomen: Flat, soft with bowel sounds active. Nontender, no guarding, rebound, hernias, masses, or organomegaly.  Lymphatics: Non tender without lymphadenopathy.  Genitourinary:  Musculoskeletal: Full ROM all peripheral extremities, joint stability, 5/5 strength, and normal gait. Skin: Warm and dry without rashes, lesions, cyanosis, clubbing or  ecchymosis.  Neuro: Cranial  nerves intact, reflexes equal bilaterally. Normal muscle tone, no cerebellar symptoms. Sensation intact.  Pysch: Alert and oriented X 3, normal affect, Insight and Judgment appropriate.   Assessment and Plan  1. Annual Preventative Screening Examination           Patient was counseled in prudent diet to achieve/maintain BMI less than 25 for weight control, BP monitoring, regular exercise and medications. Discussed med's effects and SE's. Screening labs  and tests as requested with regular follow-up as recommended. Over 40 minutes of exam, counseling, chart review and high complex critical decision making was performed.

## 2018-08-27 ENCOUNTER — Encounter: Payer: Self-pay | Admitting: Internal Medicine

## 2018-08-27 ENCOUNTER — Ambulatory Visit: Payer: BLUE CROSS/BLUE SHIELD | Admitting: Internal Medicine

## 2018-08-27 VITALS — BP 128/72 | HR 79 | Temp 97.4°F | Ht 63.5 in | Wt 193.4 lb

## 2018-08-27 DIAGNOSIS — Z1389 Encounter for screening for other disorder: Secondary | ICD-10-CM | POA: Diagnosis not present

## 2018-08-27 DIAGNOSIS — Z8249 Family history of ischemic heart disease and other diseases of the circulatory system: Secondary | ICD-10-CM | POA: Diagnosis not present

## 2018-08-27 DIAGNOSIS — R7303 Prediabetes: Secondary | ICD-10-CM

## 2018-08-27 DIAGNOSIS — R5383 Other fatigue: Secondary | ICD-10-CM

## 2018-08-27 DIAGNOSIS — Z1329 Encounter for screening for other suspected endocrine disorder: Secondary | ICD-10-CM | POA: Diagnosis not present

## 2018-08-27 DIAGNOSIS — Z23 Encounter for immunization: Secondary | ICD-10-CM

## 2018-08-27 DIAGNOSIS — Z13220 Encounter for screening for lipoid disorders: Secondary | ICD-10-CM

## 2018-08-27 DIAGNOSIS — Z130 Encounter for screening for diseases of the blood and blood-forming organs and certain disorders involving the immune mechanism: Secondary | ICD-10-CM | POA: Diagnosis not present

## 2018-08-27 DIAGNOSIS — Z1211 Encounter for screening for malignant neoplasm of colon: Secondary | ICD-10-CM

## 2018-08-27 DIAGNOSIS — Z136 Encounter for screening for cardiovascular disorders: Secondary | ICD-10-CM

## 2018-08-27 DIAGNOSIS — Z0001 Encounter for general adult medical examination with abnormal findings: Secondary | ICD-10-CM

## 2018-08-27 DIAGNOSIS — K219 Gastro-esophageal reflux disease without esophagitis: Secondary | ICD-10-CM

## 2018-08-27 DIAGNOSIS — Z79899 Other long term (current) drug therapy: Secondary | ICD-10-CM | POA: Diagnosis not present

## 2018-08-27 DIAGNOSIS — Z111 Encounter for screening for respiratory tuberculosis: Secondary | ICD-10-CM

## 2018-08-27 DIAGNOSIS — B351 Tinea unguium: Secondary | ICD-10-CM

## 2018-08-27 DIAGNOSIS — E559 Vitamin D deficiency, unspecified: Secondary | ICD-10-CM

## 2018-08-27 DIAGNOSIS — Z131 Encounter for screening for diabetes mellitus: Secondary | ICD-10-CM

## 2018-08-27 DIAGNOSIS — E669 Obesity, unspecified: Secondary | ICD-10-CM

## 2018-08-27 DIAGNOSIS — Z0000 Encounter for general adult medical examination without abnormal findings: Secondary | ICD-10-CM | POA: Diagnosis not present

## 2018-08-27 DIAGNOSIS — I10 Essential (primary) hypertension: Secondary | ICD-10-CM | POA: Diagnosis not present

## 2018-08-27 DIAGNOSIS — Z1212 Encounter for screening for malignant neoplasm of rectum: Secondary | ICD-10-CM

## 2018-08-27 DIAGNOSIS — E782 Mixed hyperlipidemia: Secondary | ICD-10-CM

## 2018-08-27 MED ORDER — TERBINAFINE HCL 250 MG PO TABS: ORAL_TABLET | ORAL | 1 refills | Status: DC

## 2018-08-28 LAB — COMPLETE METABOLIC PANEL WITH GFR
AG Ratio: 2.1 (calc) (ref 1.0–2.5)
ALBUMIN MSPROF: 4.5 g/dL (ref 3.6–5.1)
ALKALINE PHOSPHATASE (APISO): 85 U/L (ref 33–130)
ALT: 29 U/L (ref 6–29)
AST: 21 U/L (ref 10–35)
BUN: 17 mg/dL (ref 7–25)
CO2: 29 mmol/L (ref 20–32)
CREATININE: 0.89 mg/dL (ref 0.50–1.05)
Calcium: 10.2 mg/dL (ref 8.6–10.4)
Chloride: 106 mmol/L (ref 98–110)
GFR, EST NON AFRICAN AMERICAN: 71 mL/min/{1.73_m2} (ref >=60)
GFR, Est African American: 82 mL/min/{1.73_m2} (ref >=60)
GLUCOSE: 96 mg/dL (ref 65–99)
Globulin: 2.1 g/dL (calc) (ref 1.9–3.7)
Potassium: 4.8 mmol/L (ref 3.5–5.3)
Sodium: 143 mmol/L (ref 135–146)
TOTAL PROTEIN: 6.6 g/dL (ref 6.1–8.1)
Total Bilirubin: 0.4 mg/dL (ref 0.2–1.2)

## 2018-08-28 LAB — HEMOGLOBIN A1C
Hgb A1c MFr Bld: 5.3 % of total Hgb (ref <5.7)
Mean Plasma Glucose: 105 (calc)
eAG (mmol/L): 5.8 (calc)

## 2018-08-28 LAB — URINALYSIS, ROUTINE W REFLEX MICROSCOPIC
BILIRUBIN URINE: NEGATIVE
GLUCOSE, UA: NEGATIVE
Hgb urine dipstick: NEGATIVE
KETONES UR: NEGATIVE
Leukocytes, UA: NEGATIVE
Nitrite: NEGATIVE
PROTEIN: NEGATIVE
Specific Gravity, Urine: 1.011 (ref 1.001–1.03)
pH: 6.5 (ref 5.0–8.0)

## 2018-08-28 LAB — CBC WITH DIFFERENTIAL/PLATELET
Absolute Monocytes: 390 cells/uL (ref 200–950)
Basophils Absolute: 73 cells/uL (ref 0–200)
Basophils Relative: 1.2 %
EOS ABS: 153 {cells}/uL (ref 15–500)
EOS PCT: 2.5 %
HCT: 41 % (ref 35.0–45.0)
HEMOGLOBIN: 13.8 g/dL (ref 11.7–15.5)
Lymphs Abs: 1470 cells/uL (ref 850–3900)
MCH: 30.7 pg (ref 27.0–33.0)
MCHC: 33.7 g/dL (ref 32.0–36.0)
MCV: 91.1 fL (ref 80.0–100.0)
MONOS PCT: 6.4 %
MPV: 11.2 fL (ref 7.5–12.5)
NEUTROS ABS: 4014 {cells}/uL (ref 1500–7800)
Neutrophils Relative %: 65.8 %
PLATELETS: 269 10*3/uL (ref 140–400)
RBC: 4.5 10*6/uL (ref 3.80–5.10)
RDW: 12.3 % (ref 11.0–15.0)
TOTAL LYMPHOCYTE: 24.1 %
WBC: 6.1 10*3/uL (ref 3.8–10.8)

## 2018-08-28 LAB — INSULIN, RANDOM: Insulin: 28.6 u[IU]/mL — ABNORMAL HIGH (ref 2.0–19.6)

## 2018-08-28 LAB — LIPID PANEL
Cholesterol: 183 mg/dL (ref <200)
HDL: 51 mg/dL (ref >50)
LDL Cholesterol (Calc): 107 mg/dL (calc) — ABNORMAL HIGH
Non-HDL Cholesterol (Calc): 132 mg/dL (calc) — ABNORMAL HIGH (ref <130)
TRIGLYCERIDES: 141 mg/dL (ref <150)
Total CHOL/HDL Ratio: 3.6 (calc) (ref <5.0)

## 2018-08-28 LAB — MICROALBUMIN / CREATININE URINE RATIO
CREATININE, URINE: 81 mg/dL (ref 20–275)
Microalb Creat Ratio: 5 mcg/mg creat (ref <30)
Microalb, Ur: 0.4 mg/dL

## 2018-08-28 LAB — IRON, TOTAL/TOTAL IRON BINDING CAP
%SAT: 22 % (calc) (ref 16–45)
Iron: 67 ug/dL (ref 45–160)
TIBC: 307 mcg/dL (calc) (ref 250–450)

## 2018-08-28 LAB — VITAMIN B12: VITAMIN B 12: 426 pg/mL (ref 200–1100)

## 2018-08-28 LAB — VITAMIN D 25 HYDROXY (VIT D DEFICIENCY, FRACTURES): VIT D 25 HYDROXY: 80 ng/mL (ref 30–100)

## 2018-08-28 LAB — TSH: TSH: 2.92 mIU/L (ref 0.40–4.50)

## 2018-08-28 LAB — MAGNESIUM: Magnesium: 2.2 mg/dL (ref 1.5–2.5)

## 2018-10-05 ENCOUNTER — Other Ambulatory Visit: Payer: Self-pay | Admitting: Internal Medicine

## 2018-10-14 ENCOUNTER — Other Ambulatory Visit: Payer: Self-pay | Admitting: Internal Medicine

## 2018-12-09 ENCOUNTER — Ambulatory Visit: Payer: Self-pay | Admitting: Adult Health

## 2018-12-11 DIAGNOSIS — J3089 Other allergic rhinitis: Secondary | ICD-10-CM | POA: Diagnosis not present

## 2018-12-11 DIAGNOSIS — J4530 Mild persistent asthma, uncomplicated: Secondary | ICD-10-CM | POA: Diagnosis not present

## 2018-12-11 DIAGNOSIS — J301 Allergic rhinitis due to pollen: Secondary | ICD-10-CM | POA: Diagnosis not present

## 2018-12-11 DIAGNOSIS — H1045 Other chronic allergic conjunctivitis: Secondary | ICD-10-CM | POA: Diagnosis not present

## 2018-12-16 DIAGNOSIS — K219 Gastro-esophageal reflux disease without esophagitis: Secondary | ICD-10-CM | POA: Insufficient documentation

## 2018-12-16 NOTE — Progress Notes (Signed)
FOLLOW UP  Assessment and Plan:   Asthma Well controlled on current regimen Continue meds, avoid triggers  Hypertension Fairly controlled on current regimen - start checking BPs at home Monitor blood pressure at home; patient to call if consistently greater than 130/80 Continue DASH diet.   Reminder to go to the ER if any CP, SOB, nausea, dizziness, severe HA, changes vision/speech, left arm numbness and tingling and jaw pain.  Cholesterol Currently near goal; continue statin therapy; working on lifestyle Continue low cholesterol diet and exercise.  Check lipid panel.   Other abnormal glucose Recent A1Cs at goal Discussed diet/exercise, weight management  Defer A1C; check CMP  Obesity with co morbidities Long discussion about weight loss, diet, and exercise Recommended diet heavy in fruits and veggies and low in animal meats, cheeses, and dairy products, appropriate calorie intake Discussed ideal weight for height (below 150lb) and initial weight goal (180lb) Patient will work on reducing stress eating, portion control Will follow up in 3 months  Vitamin D Def  At goal at last visit; continue supplementation to maintain goal of 70-100 Defer Vit D level  GERD Well managed on current medications Discussed diet, avoiding triggers and other lifestyle changes  Abdominal cramping/loose stools ? Food sensitivity, elimination diet discussed, fodmap information provided Try adding probiotic (align), soluble fiber Checking basic labs today Follow up if not resolving; Please go to the ER if you have any severe AB pain, unable to hold down food/water, blood in stool or vomit, chest pain, shortness of breath, or any worsening symptoms.    Continue diet and meds as discussed. Further disposition pending results of labs. Discussed med's effects and SE's.   Over 30 minutes of exam, counseling, chart review, and critical decision making was performed.   Future Appointments  Date  Time Provider [place]  03/11/2019  4:30 PM Unk Pinto, MD [facility] None  09/12/2019  9:00 AM Unk Pinto, MD [facility] None    ----------------------------------------------------------------------------------------------------------------------  HPI 58 y.o. female  presents for 3 month follow up on hypertension, cholesterol, glucose management, obesity and vitamin D deficiency.   She has asthma currently well controlled on Symbicort and Singulair. She uses albuterol rarely.  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    She reports intermittent cramping in lower abdominal cramping with loose stools x 2-3 weeks; she has been eating increased fiber with 56 days diet plan and attributes to this. Has cut down on apples and nuts and feels somewhat improved. She had colonoscopy in 2017 with benign polyp and colonic diverticulosis. She denies n/v, bloating, belching, increased gassiness. Denies fatty/black stools, blood in stool, mucus.   BMI is Body mass index is 33.79 kg/m., she has been working on diet and exercise, but reports stressful few months and feels she has been stress eating. She is getting back on the next 56 days diet, goal to get back down to 145 lb.  Wt Readings from Last 3 Encounters:  12/17/18 193 lb 12.8 oz (87.9 kg)  08/27/18 193 lb 6.4 oz (87.7 kg)  07/02/18 192 lb (87.1 kg)   Her blood pressure has been controlled at home, today their BP is BP: 138/84  She does workout. She denies chest pain, shortness of breath, dizziness.   She is  on cholesterol medication (pravastatin 40 mg daily) and denies myalgias. Her cholesterol is not at goal. The cholesterol last visit was:   Lab Results  Component Value Date   CHOL 183  08/27/2018   HDL 51 08/27/2018   LDLCALC 107 (H) 08/27/2018   TRIG 141 08/27/2018   CHOLHDL 3.6 08/27/2018    She has been working on diet and exercise for glucose management, and denies foot ulcerations, increased appetite, nausea, paresthesia of the feet, polydipsia, polyuria, visual disturbances, vomiting and weight loss. Last A1C in the office was:  Lab Results  Component Value Date   HGBA1C 5.3 08/27/2018   Patient is on Vitamin D supplement and at goal at recent check:    Lab Results  Component Value Date   VD25OH 80 08/27/2018       Current Medications:  Current Outpatient Medications on File Prior to Visit  Medication Sig  . albuterol (PROVENTIL HFA;VENTOLIN HFA) 108 (90 BASE) MCG/ACT inhaler Inhale into the lungs every 6 (six) hours as needed for wheezing or shortness of breath.  Marland Kitchen aspirin 81 MG tablet Take 81 mg by mouth daily.  . budesonide-formoterol (SYMBICORT) 160-4.5 MCG/ACT inhaler Inhale 2 puffs into the lungs 2 (two) times daily.  . Cholecalciferol (VITAMIN D PO) Take 7,000 Units by mouth daily.   . diclofenac (CATAFLAM) 50 MG tablet TAKE ONE TABLET BY MOUTH 3 TIMES A DAY AS NEEDED  . levocetirizine (XYZAL) 5 MG tablet   . losartan (COZAAR) 100 MG tablet TAKE ONE TABLET BY MOUTH EVERY DAY  . montelukast (SINGULAIR) 10 MG tablet Take 10 mg by mouth at bedtime.  . Multiple Vitamins-Minerals (MULTIVITAMIN PO) Take by mouth daily.  Marland Kitchen OVER THE COUNTER MEDICATION Bone strenght vitamin with Calcium and Magnesium  . pantoprazole (PROTONIX) 40 MG tablet TAKE ONE TABLET BY MOUTH EVERY DAY  . pravastatin (PRAVACHOL) 40 MG tablet TAKE ONE TABLET BY MOUTH AT BEDTIME FOR CHOLESTEROL  . triamcinolone ointment (KENALOG) 0.1 % Apply 1 application topically 2 (two) times daily. (Patient taking differently: Apply 1 application topically as needed. )  . terbinafine (LAMISIL) 250 MG tablet Take 1 tablet daily for Toenail Fungus (Patient not taking: Reported on 12/17/2018)   No current  facility-administered medications on file prior to visit.      Allergies:  Allergies  Allergen Reactions  . Ace Inhibitors     cough  . Azithromycin     rash  . Prednisone     High dose  . Vicodin [Hydrocodone-Acetaminophen]     Nausea/vomiting     Medical History:  Past Medical History:  Diagnosis Date  . Allergy   . Asthma   . Hyperlipidemia   . Hypertension   . Migraine   . Obesity   . Prediabetes   . Vitamin D deficiency    Family history- Reviewed and unchanged Social history- Reviewed and unchanged   Review of Systems:  Review of Systems  Constitutional: Negative for malaise/fatigue and weight loss.  HENT: Negative for hearing loss and tinnitus.   Eyes: Negative for blurred vision and double vision.  Respiratory: Negative for cough, shortness of breath and wheezing.   Cardiovascular: Negative for chest pain, palpitations, orthopnea, claudication and leg swelling.  Gastrointestinal: Positive for diarrhea (loose stools). Negative for abdominal pain, blood in stool, constipation, heartburn, melena, nausea and vomiting.       Mild intermittent lower abdominal cramping  Genitourinary: Negative.   Musculoskeletal: Negative for falls, joint pain, myalgias and neck pain.  Skin: Negative  for rash.  Neurological: Negative for dizziness, tingling, sensory change, weakness and headaches.  Endo/Heme/Allergies: Negative for polydipsia.  Psychiatric/Behavioral: Negative.   All other systems reviewed and are negative.   Physical Exam: BP 138/84   Pulse 74   Temp 97.9 F (36.6 C)   Wt 193 lb 12.8 oz (87.9 kg)   SpO2 98%   BMI 33.79 kg/m  Wt Readings from Last 3 Encounters:  12/17/18 193 lb 12.8 oz (87.9 kg)  08/27/18 193 lb 6.4 oz (87.7 kg)  07/02/18 192 lb (87.1 kg)   General Appearance: Well nourished, in no apparent distress. Eyes: PERRLA, EOMs, conjunctiva no swelling or erythema Sinuses: No Frontal/maxillary tenderness ENT/Mouth: Ext aud canals clear, TMs  without erythema, bulging. No erythema, swelling, or exudate on post pharynx.  Tonsils not swollen or erythematous. Hearing normal.  Neck: Supple, thyroid normal.  Respiratory: Respiratory effort normal, BS equal bilaterally without rales, rhonchi, wheezing or stridor.  Cardio: RRR with no MRGs. Brisk peripheral pulses without edema.  Abdomen: Soft, + BS.  Mildly tender throughout except center, no guarding, rebound, hernias, masses. Lymphatics: Non tender without lymphadenopathy.  Musculoskeletal: Full ROM without effusion, obvious deformity. Normal gait.   Skin: Warm, dry without rashes, lesions, ecchymosis.  Neuro: Cranial nerves intact. No cerebellar symptoms.  Psych: Awake and oriented X 3, normal affect, Insight and Judgment appropriate.    [name], NP 9:15 AM [facility]

## 2018-12-17 ENCOUNTER — Encounter: Payer: Self-pay | Admitting: Adult Health

## 2018-12-17 ENCOUNTER — Other Ambulatory Visit: Payer: Self-pay

## 2018-12-17 ENCOUNTER — Ambulatory Visit: Payer: BLUE CROSS/BLUE SHIELD | Admitting: Adult Health

## 2018-12-17 VITALS — BP 138/84 | HR 74 | Temp 97.9°F | Wt 193.8 lb

## 2018-12-17 DIAGNOSIS — I10 Essential (primary) hypertension: Secondary | ICD-10-CM | POA: Diagnosis not present

## 2018-12-17 DIAGNOSIS — R7303 Prediabetes: Secondary | ICD-10-CM

## 2018-12-17 DIAGNOSIS — E559 Vitamin D deficiency, unspecified: Secondary | ICD-10-CM

## 2018-12-17 DIAGNOSIS — E782 Mixed hyperlipidemia: Secondary | ICD-10-CM | POA: Diagnosis not present

## 2018-12-17 DIAGNOSIS — Z79899 Other long term (current) drug therapy: Secondary | ICD-10-CM

## 2018-12-17 DIAGNOSIS — J45909 Unspecified asthma, uncomplicated: Secondary | ICD-10-CM

## 2018-12-17 DIAGNOSIS — E669 Obesity, unspecified: Secondary | ICD-10-CM

## 2018-12-17 DIAGNOSIS — R7309 Other abnormal glucose: Secondary | ICD-10-CM

## 2018-12-17 DIAGNOSIS — K219 Gastro-esophageal reflux disease without esophagitis: Secondary | ICD-10-CM

## 2018-12-17 NOTE — Patient Instructions (Addendum)
Goals    . Blood Pressure < 130/80    . LDL CALC < 100    . Weight (lb) < 180 lb (81.6 kg)       Try adding a daily soluble fiber supplement (citrucel, benefiber)  Could try adding probiotic - "align" is a good product with research to back up  Continue with elimination diet and try keeping a food log to identify problematic foods    If diarrhea is severe - 4+ watery diarrhea episodes, take imodium to reduce fluid loss, and start on electrolyte supplemented fluids.   Please go to the ER if you have any severe AB pain, unable to hold down food/water, blood in stool or vomit, chest pain, shortness of breath, or any worsening symptoms.   Consider keeping a food diary- common causes of diarrhea are dairy, certain carbs...  FODMAP stands for fermentable oligo-, di-, mono-saccharides and polyols (1). These are the scientific terms used to classify groups of carbs that are notorious for triggering digestive symptoms like bloating, gas and stomach pain.   FODMAPs are found in a wide range of foods in varying amounts. Some foods contain just one type, while others contain several.  The main dietary sources of the four groups of FODMAPs include:  Oligosaccharides: Wheat, rye, legumes and various fruits and vegetables, such as garlic and onions.  Disaccharides: Milk, yogurt and soft cheese. Lactose is the main carb.  Monosaccharides: Various fruit including figs and mangoes, and sweeteners such as honey and agave nectar. Fructose is the main carb.  Polyols: Certain fruits and vegetables including blackberries and lychee, as well as some low-calorie sweeteners like those in sugar-free gum.   Keep a food diary. This will help you identify foods that cause symptoms. Write down: ? What you eat and when. ? What symptoms you have. ? When symptoms occur in relation to your meals.  Avoid foods that cause symptoms. Talk with your dietitian about other ways to get the same nutrients that are  in these foods.  Eat your meals slowly, in a relaxed setting.  Aim to eat 5-6 small meals per day. Do not skip meals.  Drink enough fluids to keep your urine clear or pale yellow.  Ask your health care provider if you should take an over-the-counter probiotic during flare-ups to help restore healthy gut bacteria.  If you have cramping or diarrhea, try making your meals low in fat and high in carbohydrates. Examples of carbohydrates are pasta, rice, whole grain breads and cereals, fruits, and vegetables.  If dairy products cause your symptoms to flare up, try eating less of them. You might be able to handle yogurt better than other dairy products because it contains bacteria that help with digestion.

## 2018-12-18 ENCOUNTER — Other Ambulatory Visit: Payer: Self-pay | Admitting: Adult Health

## 2018-12-18 DIAGNOSIS — N182 Chronic kidney disease, stage 2 (mild): Secondary | ICD-10-CM

## 2018-12-18 LAB — CBC WITH DIFFERENTIAL/PLATELET
Absolute Monocytes: 358 cells/uL (ref 200–950)
Basophils Absolute: 90 cells/uL (ref 0–200)
Basophils Relative: 1.6 %
Eosinophils Absolute: 162 cells/uL (ref 15–500)
Eosinophils Relative: 2.9 %
HCT: 40 % (ref 35.0–45.0)
Hemoglobin: 13.7 g/dL (ref 11.7–15.5)
Lymphs Abs: 1322 cells/uL (ref 850–3900)
MCH: 30.6 pg (ref 27.0–33.0)
MCHC: 34.3 g/dL (ref 32.0–36.0)
MCV: 89.3 fL (ref 80.0–100.0)
MPV: 11 fL (ref 7.5–12.5)
Monocytes Relative: 6.4 %
Neutro Abs: 3668 cells/uL (ref 1500–7800)
Neutrophils Relative %: 65.5 %
Platelets: 210 10*3/uL (ref 140–400)
RBC: 4.48 10*6/uL (ref 3.80–5.10)
RDW: 12.6 % (ref 11.0–15.0)
Total Lymphocyte: 23.6 %
WBC: 5.6 10*3/uL (ref 3.8–10.8)

## 2018-12-18 LAB — TSH: TSH: 2.99 mIU/L (ref 0.40–4.50)

## 2018-12-18 LAB — LIPID PANEL
Cholesterol: 188 mg/dL (ref <200)
HDL: 49 mg/dL — ABNORMAL LOW (ref >=50)
LDL Cholesterol (Calc): 113 mg/dL (calc) — ABNORMAL HIGH
Non-HDL Cholesterol (Calc): 139 mg/dL (calc) — ABNORMAL HIGH (ref <130)
Total CHOL/HDL Ratio: 3.8 (calc) (ref <5.0)
Triglycerides: 145 mg/dL (ref <150)

## 2018-12-18 LAB — COMPLETE METABOLIC PANEL WITH GFR
AG Ratio: 2.1 (calc) (ref 1.0–2.5)
ALT: 27 U/L (ref 6–29)
AST: 24 U/L (ref 10–35)
Albumin: 4.4 g/dL (ref 3.6–5.1)
Alkaline phosphatase (APISO): 69 U/L (ref 37–153)
BUN: 19 mg/dL (ref 7–25)
CO2: 26 mmol/L (ref 20–32)
Calcium: 9.9 mg/dL (ref 8.6–10.4)
Chloride: 106 mmol/L (ref 98–110)
Creat: 1.04 mg/dL (ref 0.50–1.05)
GFR, Est African American: 68 mL/min/{1.73_m2} (ref >=60)
GFR, Est Non African American: 59 mL/min/{1.73_m2} — ABNORMAL LOW (ref >=60)
Globulin: 2.1 g/dL (calc) (ref 1.9–3.7)
Glucose, Bld: 99 mg/dL (ref 65–99)
Potassium: 4.3 mmol/L (ref 3.5–5.3)
Sodium: 141 mmol/L (ref 135–146)
Total Bilirubin: 0.5 mg/dL (ref 0.2–1.2)
Total Protein: 6.5 g/dL (ref 6.1–8.1)

## 2018-12-18 LAB — MAGNESIUM: Magnesium: 2.3 mg/dL (ref 1.5–2.5)

## 2019-01-02 ENCOUNTER — Other Ambulatory Visit: Payer: Self-pay | Admitting: Internal Medicine

## 2019-01-08 ENCOUNTER — Other Ambulatory Visit: Payer: Self-pay

## 2019-01-08 ENCOUNTER — Other Ambulatory Visit: Payer: BC Managed Care – PPO

## 2019-01-08 DIAGNOSIS — N182 Chronic kidney disease, stage 2 (mild): Secondary | ICD-10-CM

## 2019-01-08 DIAGNOSIS — E782 Mixed hyperlipidemia: Secondary | ICD-10-CM | POA: Diagnosis not present

## 2019-01-09 LAB — BASIC METABOLIC PANEL WITH GFR
BUN: 19 mg/dL (ref 7–25)
CO2: 28 mmol/L (ref 20–32)
Calcium: 9.9 mg/dL (ref 8.6–10.4)
Chloride: 106 mmol/L (ref 98–110)
Creat: 0.97 mg/dL (ref 0.50–1.05)
GFR, Est African American: 74 mL/min/{1.73_m2} (ref >=60)
GFR, Est Non African American: 64 mL/min/{1.73_m2} (ref >=60)
Glucose, Bld: 112 mg/dL — ABNORMAL HIGH (ref 65–99)
Potassium: 4 mmol/L (ref 3.5–5.3)
Sodium: 141 mmol/L (ref 135–146)

## 2019-01-29 ENCOUNTER — Encounter: Payer: Self-pay | Admitting: Adult Health

## 2019-01-29 ENCOUNTER — Ambulatory Visit: Payer: BC Managed Care – PPO | Admitting: Adult Health

## 2019-01-29 ENCOUNTER — Other Ambulatory Visit: Payer: Self-pay

## 2019-01-29 VITALS — Wt 184.8 lb

## 2019-01-29 DIAGNOSIS — M549 Dorsalgia, unspecified: Secondary | ICD-10-CM

## 2019-01-29 DIAGNOSIS — M62830 Muscle spasm of back: Secondary | ICD-10-CM | POA: Diagnosis not present

## 2019-01-29 MED ORDER — CYCLOBENZAPRINE HCL 5 MG PO TABS: 5.0000 mg | ORAL_TABLET | Freq: Three times a day (TID) | ORAL | 0 refills | Status: DC | PRN

## 2019-01-29 MED ORDER — MELOXICAM 15 MG PO TABS: ORAL_TABLET | ORAL | 1 refills | Status: DC

## 2019-01-29 NOTE — Progress Notes (Signed)
Virtual Visit via Telephone Note  I connected with Rhonda [name] on 01/29/19 at  2:30 PM EDT by telephone and verified that I am speaking with the correct person using two identifiers.  Location: Patient: work Provider: Marybelle Killings office   I discussed the limitations, risks, security and privacy concerns of performing an evaluation and management service by telephone and the availability of in person appointments. I also discussed with the patient that there may be a patient responsible charge related to this service. The patient expressed understanding and agreed to proceed.   History of Present Illness:  Wt 184 lb 12.8 oz (83.8 kg)   BMI 32.22 kg/m   58 y.o. R handed female with hx of limited lower back pain calls to request evaluation of new onset L side mid back/flank pain that began 2 weeks ago, sudden onset though without notable activity prior to onset, no atypical activities.   She has tried ice/heat without much benefit, has been using tylenol and icee hot which has been helpful. Her impression is that this feels like a muscle spasm, spastic, crampy, "catching" pain worse with changes in position, she does have tenderness over the area.  5/10 intermittently. Alleviated by some position changes, worse with twisting. Denies radiating pain, paraesthesias.   Denies fever/chills, abd pain, nausea. Denies dysuria, hematurai, urgency, freuqnecy, changes in urine or bowel character.     Observations/Objective:  General : Well sounding patient in no apparent distress HEENT: no hoarseness, no cough for duration of visit Lungs: speaks in complete sentences, no audible wheezing, no apparent distress Neurological: alert, oriented x 3 Psychiatric: pleasant, judgement appropriate    Assessment and Plan:  [name] was seen today for back pain.  Diagnoses and all orders for this visit:  Mid back pain on right side Benign hx; patient reported tenderness reassuring to confirm muscleoskeletal  etiology RICE, and exercise given If not better follow up in office/get XR or will refer to PT/orthopedics. Call with any new concerning symptoms  -     meloxicam (MOBIC) 15 MG tablet; Take one daily with food for 2 weeks, can take with tylenol, can not take with aleve, iburpofen, then as needed daily for pain -     cyclobenzaprine (FLEXERIL) 5 MG tablet; Take 1 tablet (5 mg total) by mouth 3 (three) times daily as needed for muscle spasms.  Follow Up Instructions:    I discussed the assessment and treatment plan with the patient. The patient was provided an opportunity to ask questions and all were answered. The patient agreed with the plan and demonstrated an understanding of the instructions.   The patient was advised to call back or seek an in-person evaluation if the symptoms worsen or if the condition fails to improve as anticipated.  I provided 15 minutes of non-face-to-face time during this encounter.   [name], NP

## 2019-02-17 ENCOUNTER — Other Ambulatory Visit: Payer: Self-pay

## 2019-02-17 ENCOUNTER — Ambulatory Visit: Payer: BC Managed Care – PPO | Admitting: Physician Assistant

## 2019-02-17 ENCOUNTER — Encounter: Payer: Self-pay | Admitting: Physician Assistant

## 2019-02-17 VITALS — BP 120/88 | HR 91 | Temp 97.5°F | Ht 63.5 in | Wt 191.6 lb

## 2019-02-17 DIAGNOSIS — M79674 Pain in right toe(s): Secondary | ICD-10-CM | POA: Diagnosis not present

## 2019-02-17 DIAGNOSIS — M7989 Other specified soft tissue disorders: Secondary | ICD-10-CM

## 2019-02-17 MED ORDER — PREDNISONE 20 MG PO TABS: ORAL_TABLET | ORAL | 0 refills | Status: DC

## 2019-02-17 NOTE — Patient Instructions (Addendum)
INFORMATION ABOUT YOUR XRAY  Can walk into [address] for an Insurance account manager. They will have the order and take you back. You do not any paper work, I should get the result back today or tomorrow. This order is good for a year.  Can call [phone] to schedule an appointment if you wish.   Get the xray if the labs are normal- info above  Please take the prednisone as directed below, this is NOT an antibiotic so you do NOT have to finish it. You can take it for a few days and stop it if you are doing better.   Please take the prednisone to help decrease inflammation and therefore decrease symptoms. Take it it with food to avoid GI upset. It can cause increased energy but on the other hand it can make it hard to sleep at night so please take it AT [place], it takes 8-12 hours to start working so it will NOT affect your sleeping if you take it at night with your food!!  If you are diabetic it will increase your sugars so decrease carbs and monitor your sugars closely.       Information for patients with Gout  Gout defined-Gout occurs when urate crystals accumulate in your joint causing the inflammation and intense pain of gout attack.  Urate crystals can form when you have high levels of uric acid in your blood.  Your body produces uric acid when it breaks down prurines-substances that are found naturally in your body, as well as in certain foods such as organ meats, anchioves, herring, asparagus, and mushrooms.  Normally uric acid dissolves in your blood and passes through your kidneys into your urine.  But sometimes your body either produces too much uric acid or your kidneys excrete too little uric acid.  When this happens, uric acid can build up, forming sharp needle-like urate crystals in a joint or surrounding tissue that cause pain, inflammation and swelling.    Gout is characterized by sudden, severe attacks of pain, redness and tenderness in joints, often the joint at the  base of the big toe.  Gout is complex form of arthritis that can affect anyone.  Men are more likely to get gout but women become increasingly more susceptible to gout after menopause.  An acute attack of gout can wake you up in the middle of the night with the sensation that your big toe is on fire.  The affected joint is hot, swollen and tender.   If you experience symptoms of an acute gout attack it is important to your doctor as soon as the symptoms start.  Gout that goes untreated can lead to worsening pain and joint damage.  Risk Factors:  You are more likely to develop gout if you have high levels of uric acid in your body.    Factors that increase the uric acid level in your body include:  Lifestyle factors.  Excessive alcohol use-generally more than two drinks a day for men and more than one for women increase the risk of gout.  Medical conditions.  Certain conditions make it more likely that you will develop gout.  These include hypertension, and chronic conditions such as diabetes, high levels of fat and cholesterol in the blood, and narrowing of the arteries.  Certain medications.  The uses of Thiazide diuretics- commonly used to treat hypertension and low dose aspirin can also increase uric acid levels.  Family history of gout.  If other members  of your family have had gout, you are more likely to develop the disease.  Age and sex. Gout occurs more often in men than it does in women, primarily because women tend to have lower uric acid levels than men do.  Men are more likely to develop gout earlier usually between the ages of 43-50- whereas women generally develop signs and symptoms after menopause.    Tests and diagnosis:  Tests to help diagnose gout may include:  Blood test.  Your doctor may recommend a blood test to measure the uric acid level in your blood .  Blood tests can be misleading, though.  Some people have high uric acid levels but never experience gout.  And  some people have signs and symptoms of gout, but don't have unusual levels of uric acid in their blood.  Joint fluid test.  Your doctor may use a needle to draw fluid from your affected joint.  When examined under the microscope, your joint fluid may reveal urate crystals.  Treatment:  Treatment for gout usually involves medications.  What medications you and your doctor choose will be based on your current health and other medications you currently take.  Gout medications can be used to treat acute gout attacks and prevent future attacks as well as reduce your risk of complications from gout such as the development of tophi from urate crystal deposits.  Alternative medicine:   Certain foods have been studied for their potential to lower uric acid levels, including:  Coffee.  Studies have found an association between coffee drinking (regular and decaf) and lower uric acid levels.  The evidence is not enough to encourage non-coffee drinkers to start, but it may give clues to new ways of treating gout in the future.  Vitamin C.  Supplements containing vitamin C may reduce the levels of uric acid in your blood.  However, vitamin as a treatment for gout. Don't assume that if a little vitamin C is good, than lots is better.  Megadoses of vitamin C may increase your bodies uric acid levels.  Cherries.  Cherries have been associated with lower levels of uric acid in studies, but it isn't clear if they have any effect on gout signs and symptoms.  Eating more cherries and other dar-colored fruits, such as blackberries, blueberries, purple grapes and raspberries, may be a safe way to support your gout treatment.    Lifestyle/Diet Recommendations:   Drink 8 to 16 cups ( about 2 to 4 liters) of fluid each day, with at least half being water.  Avoid alcohol  Eat a moderate amount of protein, preferably from healthy sources, such as low-fat or fat-free dairy, tofu, eggs, and nut butters.  Limit you  daily intake of meat, fish, and poultry to 4 to 6 ounces.  Avoid high fat meats and desserts.  Decrease you intake of shellfish, beef, lamb, pork, eggs and cheese.  Choose a good source of vitamin C daily such as citrus fruits, strawberries, broccoli,  brussel sprouts, papaya, and cantaloupe.   Choose a good source of vitamin A every other day such as yellow fruits, or dark green/yellow vegetables.  Avoid drastic weigh reduction or fasting.  If weigh loss is desired lose it over a period of several months.  See "dietary considerations.." chart for specific food recommendations.  Dietary Considerations for people with Gout  Food with negligible purine content (0-15 mg of purine nitrogen per 100 grams food)  May use as desired except on calorie variations  Non fat milk Cocoa Cereals (except in list II) Hard candies  Buttermilk Carbonated drinks Vegetables (except in list II) Sherbet  Coffee Fruits Sugar Honey  Tea Cottage Cheese Gelatin-jell-o Salt  Fruit juice Breads Angel food Cake   Herbs/spices Jams/Jellies [employer]    Foods that do not contain excessive purine content, but must be limited due to fat content  Cream Eggs Oil and Salad Dressing  Half and Half Peanut Butter Chocolate  Whole Milk Cakes Potato Chips  Butter Ice Cream Fried Foods  Cheese Nuts Waffles, pancakes   List II: Food with moderate purine content (50-150 mg of purine nitrogen per 100 grams of food)  Limit total amount each day to 5 oz. cooked Lean meat, other than those on list III   Poultry, other than those on list III Fish, other than those on list III   Seafood, other than those on list III  These foods may be used occasionally  Peas Lentils Bran  Spinach Oatmeal Dried Beans and Peas  Asparagus Wheat Germ Mushrooms   Additional information about meat choices  Choose fish and poultry, particularly without skin, often.  Select lean, well trimmed cuts of meat.  Avoid all fatty  meats, bacon , sausage, fried meats, fried fish, or poultry, luncheon meats, cold cuts, hot dogs, meats canned or frozen in gravy, spareribs and frozen and packaged prepared meats.   List III: Foods with HIGH purine content / Foods to AVOID (150-800 mg of purine nitrogen per 100 grams of food)  Anchovies Herring Meat Broths  Liver Mackerel Meat Extracts  Kidney Scallops Meat Drippings  Sardines Wild Game Mincemeat  Sweetbreads Goose Gravy  Heart Tongue Yeast, baker's and brewers   Commercial soups made with any of the foods listed in List II or List III  In addition avoid all alcoholic beverages

## 2019-02-17 NOTE — Progress Notes (Signed)
Subjective:    Patient ID: Rhonda [name], female    DOB: 07/21/1961, 58 y.o.   MRN: [id number]  HPI 58 y.o. WF with history of HTN, asthma, chol presents with right foot swelling and pain x 1 week.  States started to get swollen Tues/Wed. She states no injury to it.  It is tender to touch at 3rd MTP, some warmth.  She has been icing it x 1 day, she is elevating it at night.   She has been on meloxicam daily due to left sided back pain, worse in the middle of the night with movement.   She is not on an estrogen, no family history of PAD/CAD.  She denies any SOB, CP.    Lab Results  Component Value Date   GFRNONAA 64 01/08/2019   No results found for: LABURIC  Blood pressure 120/88, pulse 91, temperature (!) 97.5 F (36.4 C), height 5' 3.5" (1.613 m), weight 191 lb 9.6 oz (86.9 kg), SpO2 99 %.  Medications Current Outpatient Medications on File Prior to Visit  Medication Sig  . albuterol (PROVENTIL HFA;VENTOLIN HFA) 108 (90 BASE) MCG/ACT inhaler Inhale into the lungs every 6 (six) hours as needed for wheezing or shortness of breath.  Marland Kitchen aspirin 81 MG tablet Take 81 mg by mouth daily.  . budesonide-formoterol (SYMBICORT) 160-4.5 MCG/ACT inhaler Inhale 2 puffs into the lungs 2 (two) times daily.  . Cholecalciferol (VITAMIN D PO) Take 7,000 Units by mouth daily.   . cyclobenzaprine (FLEXERIL) 5 MG tablet Take 1 tablet (5 mg total) by mouth 3 (three) times daily as needed for muscle spasms.  Marland Kitchen levocetirizine (XYZAL) 5 MG tablet   . losartan (COZAAR) 100 MG tablet TAKE ONE TABLET BY MOUTH EVERY DAY  . meloxicam (MOBIC) 15 MG tablet Take one daily with food for 2 weeks, can take with tylenol, can not take with aleve, iburpofen, then as needed daily for pain  . montelukast (SINGULAIR) 10 MG tablet Take 10 mg by mouth at bedtime.  . Multiple Vitamins-Minerals (MULTIVITAMIN PO) Take by mouth daily.  Marland Kitchen OVER THE COUNTER MEDICATION Bone strenght vitamin with Calcium and Magnesium  .  pantoprazole (PROTONIX) 40 MG tablet TAKE 1 TABLET BY MOUTH ONCE DAILY  . pravastatin (PRAVACHOL) 40 MG tablet TAKE ONE TABLET BY MOUTH AT BEDTIME FOR CHOLESTEROL  . triamcinolone ointment (KENALOG) 0.1 % Apply 1 application topically 2 (two) times daily. (Patient taking differently: Apply 1 application topically as needed. )   No current facility-administered medications on file prior to visit.     Problem list She has Hypertension; Hyperlipidemia; Vitamin D deficiency; Other abnormal glucose; Asthma; Obesity (BMI 30.0-34.9); Medication management; and GERD (gastroesophageal reflux disease) on their problem list.  Social History   Tobacco Use  . Smoking status: Never Smoker  . Smokeless tobacco: Never Used  Substance Use Topics  . Alcohol use: No  . Drug use: No   Family History  Problem Relation Age of Onset  . Hypertension Mother   . COPD Father   . Cancer Father        colon  . Heart disease Father   . Hyperlipidemia Father   . Hypertension Father   . Stroke Father     Review of Systems     Objective:   Physical Exam  Right 3rd MTP swollen, tender, some surrounding erythema, slight warmth. Distal 3rd digit with good color, good <2 sec cap refill. Negative squeeze test. 2+ DP and TP. No evidence of abscess/skin  disruption to indicate cellulitis.        Assessment & Plan:    Pain and swelling of toe of right foot No obvious injury ? Gout, clear tenderness at MTP or cellulitis but no obvious skin break down Good TP/DP and good distal cap refill, does not appear to be blue toe syndrome and patient is lower risk for CAD Get labs, if not better get Xray and consider US/ABI Close follow up if needed Patient informed to call office if any changes in the toe or any other new symptoms.  -     CBC with Differential/Platelet -     COMPLETE METABOLIC PANEL WITH GFR -     Uric acid -     Sedimentation rate -     DG Toe 3rd Right; Future  Other orders -     predniSONE  (DELTASONE) 20 MG tablet; 2 tablets daily for 3 days, 1 tablet daily for 4 days.

## 2019-02-18 LAB — COMPLETE METABOLIC PANEL WITH GFR
AG Ratio: 2 (calc) (ref 1.0–2.5)
ALT: 12 U/L (ref 6–29)
AST: 14 U/L (ref 10–35)
Albumin: 4.4 g/dL (ref 3.6–5.1)
Alkaline phosphatase (APISO): 91 U/L (ref 37–153)
BUN: 18 mg/dL (ref 7–25)
CO2: 30 mmol/L (ref 20–32)
Calcium: 9.9 mg/dL (ref 8.6–10.4)
Chloride: 104 mmol/L (ref 98–110)
Creat: 0.86 mg/dL (ref 0.50–1.05)
GFR, Est African American: 86 mL/min/{1.73_m2} (ref >=60)
GFR, Est Non African American: 74 mL/min/{1.73_m2} (ref >=60)
Globulin: 2.2 g/dL (calc) (ref 1.9–3.7)
Glucose, Bld: 99 mg/dL (ref 65–99)
Potassium: 4.2 mmol/L (ref 3.5–5.3)
Sodium: 139 mmol/L (ref 135–146)
Total Bilirubin: 0.4 mg/dL (ref 0.2–1.2)
Total Protein: 6.6 g/dL (ref 6.1–8.1)

## 2019-02-18 LAB — CBC WITH DIFFERENTIAL/PLATELET
Absolute Monocytes: 409 cells/uL (ref 200–950)
Basophils Absolute: 47 cells/uL (ref 0–200)
Basophils Relative: 0.7 %
Eosinophils Absolute: 101 cells/uL (ref 15–500)
Eosinophils Relative: 1.5 %
HCT: 37.5 % (ref 35.0–45.0)
Hemoglobin: 12.6 g/dL (ref 11.7–15.5)
Lymphs Abs: 1394 cells/uL (ref 850–3900)
MCH: 30.9 pg (ref 27.0–33.0)
MCHC: 33.6 g/dL (ref 32.0–36.0)
MCV: 91.9 fL (ref 80.0–100.0)
MPV: 10.6 fL (ref 7.5–12.5)
Monocytes Relative: 6.1 %
Neutro Abs: 4750 cells/uL (ref 1500–7800)
Neutrophils Relative %: 70.9 %
Platelets: 265 10*3/uL (ref 140–400)
RBC: 4.08 10*6/uL (ref 3.80–5.10)
RDW: 12.1 % (ref 11.0–15.0)
Total Lymphocyte: 20.8 %
WBC: 6.7 10*3/uL (ref 3.8–10.8)

## 2019-02-18 LAB — URIC ACID: Uric Acid, Serum: 5.1 mg/dL (ref 2.5–7.0)

## 2019-02-18 LAB — SEDIMENTATION RATE: Sed Rate: 28 mm/h (ref 0–30)

## 2019-02-18 NOTE — Progress Notes (Signed)
02/18/2019-patient is aware of labs results. -e welch

## 2019-02-18 NOTE — Progress Notes (Signed)
Has been printed & placed in billing's box. July 14th 2020

## 2019-02-24 ENCOUNTER — Ambulatory Visit: Payer: BC Managed Care – PPO | Admitting: Adult Health

## 2019-02-24 ENCOUNTER — Other Ambulatory Visit: Payer: Self-pay

## 2019-02-24 ENCOUNTER — Encounter: Payer: Self-pay | Admitting: Adult Health

## 2019-02-24 VITALS — BP 142/90 | HR 106 | Temp 97.3°F | Ht 63.5 in | Wt 184.6 lb

## 2019-02-24 DIAGNOSIS — M7989 Other specified soft tissue disorders: Secondary | ICD-10-CM | POA: Diagnosis not present

## 2019-02-24 DIAGNOSIS — M549 Dorsalgia, unspecified: Secondary | ICD-10-CM | POA: Diagnosis not present

## 2019-02-24 DIAGNOSIS — M62830 Muscle spasm of back: Secondary | ICD-10-CM | POA: Diagnosis not present

## 2019-02-24 DIAGNOSIS — M79674 Pain in right toe(s): Secondary | ICD-10-CM | POA: Diagnosis not present

## 2019-02-24 MED ORDER — MELOXICAM 15 MG PO TABS: ORAL_TABLET | ORAL | 1 refills | Status: DC

## 2019-02-24 MED ORDER — CYCLOBENZAPRINE HCL 5 MG PO TABS: 5.0000 mg | ORAL_TABLET | Freq: Three times a day (TID) | ORAL | 0 refills | Status: DC | PRN

## 2019-02-24 NOTE — Patient Instructions (Signed)
Acute Back Pain, Adult Acute back pain is sudden and usually short-lived. It is often caused by an injury to the muscles and tissues in the back. The injury may result from:  A muscle or ligament getting overstretched or torn (strained). Ligaments are tissues that connect bones to each other. Lifting something improperly can cause a back strain.  Wear and tear (degeneration) of the spinal disks. Spinal disks are circular tissue that provides cushioning between the bones of the spine (vertebrae).  Twisting motions, such as while playing sports or doing yard work.  A hit to the back.  Arthritis. You may have a physical exam, lab tests, and imaging tests to find the cause of your pain. Acute back pain usually goes away with rest and home care. Follow these instructions at home: Managing pain, stiffness, and swelling  Take over-the-counter and prescription medicines only as told by your health care provider.  Your health care provider may recommend applying ice during the first 24-48 hours after your pain starts. To do this: ? Put ice in a plastic bag. ? Place a towel between your skin and the bag. ? Leave the ice on for 20 minutes, 2-3 times a day.  If directed, apply heat to the affected area as often as told by your health care provider. Use the heat source that your health care provider recommends, such as a moist heat pack or a heating pad. ? Place a towel between your skin and the heat source. ? Leave the heat on for 20-30 minutes. ? Remove the heat if your skin turns bright red. This is especially important if you are unable to feel pain, heat, or cold. You have a greater risk of getting burned. Activity   Do not stay in bed. Staying in bed for more than 1-2 days can delay your recovery.  Sit up and stand up straight. Avoid leaning forward when you sit, or hunching over when you stand. ? If you work at a desk, sit close to it so you do not need to lean over. Keep your chin tucked  in. Keep your neck drawn back, and keep your elbows bent at a right angle. Your arms should look like the letter "L." ? Sit high and close to the steering wheel when you drive. Add lower back (lumbar) support to your car seat, if needed.  Take short walks on even surfaces as soon as you are able. Try to increase the length of time you walk each day.  Do not sit, drive, or stand in one place for more than 30 minutes at a time. Sitting or standing for long periods of time can put stress on your back.  Do not drive or use heavy machinery while taking prescription pain medicine.  Use proper lifting techniques. When you bend and lift, use positions that put less stress on your back: ? Bend your knees. ? Keep the load close to your body. ? Avoid twisting.  Exercise regularly as told by your health care provider. Exercising helps your back heal faster and helps prevent back injuries by keeping muscles strong and flexible.  Work with a physical therapist to make a safe exercise program, as recommended by your health care provider. Do any exercises as told by your physical therapist. Lifestyle  Maintain a healthy weight. Extra weight puts stress on your back and makes it difficult to have good posture.  Avoid activities or situations that make you feel anxious or stressed. Stress and anxiety increase muscle   tension and can make back pain worse. Learn ways to manage anxiety and stress, such as through exercise. General instructions  Sleep on a firm mattress in a comfortable position. Try lying on your side with your knees slightly bent. If you lie on your back, put a pillow under your knees.  Follow your treatment plan as told by your health care provider. This may include: ? Cognitive or behavioral therapy. ? Acupuncture or massage therapy. ? Meditation or yoga. Contact a health care provider if:  You have pain that is not relieved with rest or medicine.  You have increasing pain going down  into your legs or buttocks.  Your pain does not improve after 2 weeks.  You have pain at night.  You lose weight without trying.  You have a fever or chills. Get help right away if:  You develop new bowel or bladder control problems.  You have unusual weakness or numbness in your arms or legs.  You develop nausea or vomiting.  You develop abdominal pain.  You feel faint. Summary  Acute back pain is sudden and usually short-lived.  Use proper lifting techniques. When you bend and lift, use positions that put less stress on your back.  Take over-the-counter and prescription medicines and apply heat or ice as directed by your health care provider. This information is not intended to replace advice given to you by your health care provider. Make sure you discuss any questions you have with your health care provider. Document Released: 07/24/2005 Document Revised: 11/12/2018 Document Reviewed: 03/07/2017 Elsevier Patient Education  2020 [employer]  

## 2019-02-24 NOTE — Progress Notes (Signed)
Assessment and Plan:  Norissa was seen today for back pain and foot pain.  Diagnoses and all orders for this visit:  Mid back pain on left side Most suggestive of simple muscle spasm, but patient preference to proceed with referral and evaluation due to severity of symptoms and lack of improvement Prednisone wasn't helpful; defer xray to ortho, multiple concerns; may benefit from PT/dry needling Advised rest, avoid aggravating movements, consider heat application -     Ambulatory referral to Orthopedics -     meloxicam (MOBIC) 15 MG tablet; Take one daily with food for 2 weeks, can take with tylenol, can not take with aleve, iburpofen, then as needed daily for pain -     cyclobenzaprine (FLEXERIL) 5 MG tablet; Take 1 tablet (5 mg total) by mouth 3 (three) times daily as needed for muscle spasms.  Pain and swelling of toe of right foot Most suggestive of gout though uric acid was normal, and no improvement with prednisone taper As no improvement in sx, too soon to recheck uric acid Will have her discuss with ortho and defer xray to them Vascular etiology has been considered but low suspicion as blanches well, warm to touch; may still consider US/ABI if persistent and unexplained Doesn't appear cellulitic  Continue meloxicam Follow up with any changes -     Ambulatory referral to Orthopedics   Further disposition pending results of labs. Discussed med's effects and SE's.   Over 30 minutes of exam, counseling, chart review, and critical decision making was performed.   Future Appointments  Date Time Provider [place]  03/19/2019  4:30 PM Unk Pinto, MD [facility] None  09/12/2019  9:00 AM Unk Pinto, MD [facility] None    ------------------------------------------------------------------------------------------------------------------   HPI BP (!) 142/90   Pulse (!) 106   Temp (!) 97.3 F (36.3 C)   Ht 5' 3.5" (1.613 m)   Wt 184 lb 9.6 oz (83.7 kg)   SpO2 97%    BMI 32.19 kg/m   58 y.o.female presents for follow up due to persistent left mid back pain; describes as "seizing" "sharp" and "grabbing" now ongoing for 4 weeks, as well as unexplained Right 3rd toe swelling and tenderness x 2 weeks.   R handed female with hx of limited lower back pain called on 6/24 to request evaluation of new onset L side mid back/flank pain that began 2 weeks prior, sudden onset though without notable activity prior to onset, no atypical activities. She has tried ice/heat without much benefit, has been using tylenol and icee hot which has been helpful. Her impression is that this feels like a muscle spasm, spastic, crampy, "catching" pain worse with changes in position, she does have tenderness over the area.  5/10 intermittently. Alleviated by some position changes (flat on back nearly resolves pain), worse with twisting. Denies radiating pain, paraesthesias. She was prescribed meloxicam 15 mg and flexeril which she reports does improve pain to 1-2/10, but once medication runs out seems back to baseline and not improving. She is having difficulty sleeping at night due to this.   Denies fever/chills, abd pain, nausea. Denies dysuria, hematurai, urgency, freuqnecy, changes in urine or bowel character.   She was seen by Estill Bamberg on 7/13 for pain and swelling of the R 3rd toe, no injury prior, no hx of gout, CBC, CMP/GFR, sed rate were unremarkable, uric acid was:  Lab Results  Component Value Date   LABURIC 5.1 02/17/2019   She was given a prednisone taper for ?  Gout flare, per patient no improvement. Xray vs US/ABI was discussed, though low suspicion of vascular etiology due to warm to touch, good distal cap refill, not suggestive of blue toe syndrome. Never did get xray. No sign of bite/wound, doesn't appear cellulitic.    Past Medical History:  Diagnosis Date  . Allergy   . Asthma   . Hyperlipidemia   . Hypertension   . Migraine   . Obesity   . Prediabetes   . Vitamin  D deficiency      Allergies  Allergen Reactions  . Ace Inhibitors     cough  . Azithromycin     rash  . Prednisone     High dose  . Vicodin [Hydrocodone-Acetaminophen]     Nausea/vomiting    Current Outpatient Medications on File Prior to Visit  Medication Sig  . albuterol (PROVENTIL HFA;VENTOLIN HFA) 108 (90 BASE) MCG/ACT inhaler Inhale into the lungs every 6 (six) hours as needed for wheezing or shortness of breath.  Marland Kitchen aspirin 81 MG tablet Take 81 mg by mouth daily.  . budesonide-formoterol (SYMBICORT) 160-4.5 MCG/ACT inhaler Inhale 2 puffs into the lungs 2 (two) times daily.  . Cholecalciferol (VITAMIN D PO) Take 7,000 Units by mouth daily.   Marland Kitchen levocetirizine (XYZAL) 5 MG tablet   . losartan (COZAAR) 100 MG tablet TAKE ONE TABLET BY MOUTH EVERY DAY  . montelukast (SINGULAIR) 10 MG tablet Take 10 mg by mouth at bedtime.  . Multiple Vitamins-Minerals (MULTIVITAMIN PO) Take by mouth daily.  Marland Kitchen OVER THE COUNTER MEDICATION Bone strenght vitamin with Calcium and Magnesium  . pantoprazole (PROTONIX) 40 MG tablet TAKE 1 TABLET BY MOUTH ONCE DAILY  . pravastatin (PRAVACHOL) 40 MG tablet TAKE ONE TABLET BY MOUTH AT BEDTIME FOR CHOLESTEROL  . predniSONE (DELTASONE) 20 MG tablet 2 tablets daily for 3 days, 1 tablet daily for 4 days.  Marland Kitchen triamcinolone ointment (KENALOG) 0.1 % Apply 1 application topically 2 (two) times daily. (Patient taking differently: Apply 1 application topically as needed. )  . cyclobenzaprine (FLEXERIL) 5 MG tablet Take 1 tablet (5 mg total) by mouth 3 (three) times daily as needed for muscle spasms. (Patient not taking: Reported on 02/24/2019)  . meloxicam (MOBIC) 15 MG tablet Take one daily with food for 2 weeks, can take with tylenol, can not take with aleve, iburpofen, then as needed daily for pain (Patient not taking: Reported on 02/24/2019)   No current facility-administered medications on file prior to visit.     ROS: all negative except above.   Physical  Exam:  BP (!) 142/90   Pulse (!) 106   Temp (!) 97.3 F (36.3 C)   Ht 5' 3.5" (1.613 m)   Wt 184 lb 9.6 oz (83.7 kg)   SpO2 97%   BMI 32.19 kg/m   General Appearance: Well nourished, in no apparent distress. Eyes: conjunctiva no swelling or erythema ENT/Mouth: No erythema, swelling, or exudate on post pharynx.  Tonsils not swollen or erythematous. Hearing normal.  Neck: Supple Respiratory: Respiratory effort normal, BS equal bilaterally without rales, rhonchi, wheezing or stridor.  Cardio: RRR with no MRGs. Brisk peripheral pulses without edema.  Abdomen: Soft, + BS.  Non tender, no guarding, rebound, hernias, masses. Lymphatics: Non tender without lymphadenopathy.  Musculoskeletal: Lumbar limited in all spheres by pain, she has left paraspinal tenderness, 5/5 strength, antalgic gait. Neg straight leg raise. R 3rd toe puffy, mildly pink, warm to touch, blanches quickly, no breakdown in skin; tender to palpation of MTP  joint, ? crepitus but without palpable cyst or bony abnormality Skin: Warm, dry without rashes, lesions, ecchymosis.  Neuro: Sensation intact.  Psych: Awake and oriented X 3, normal affect, Insight and Judgment appropriate.     [name], NP 12:42 PM [facility]

## 2019-02-25 DIAGNOSIS — M545 Low back pain: Secondary | ICD-10-CM | POA: Diagnosis not present

## 2019-02-27 DIAGNOSIS — M79674 Pain in right toe(s): Secondary | ICD-10-CM | POA: Diagnosis not present

## 2019-03-03 DIAGNOSIS — M6281 Muscle weakness (generalized): Secondary | ICD-10-CM | POA: Diagnosis not present

## 2019-03-03 DIAGNOSIS — M545 Low back pain: Secondary | ICD-10-CM | POA: Diagnosis not present

## 2019-03-10 DIAGNOSIS — M6281 Muscle weakness (generalized): Secondary | ICD-10-CM | POA: Diagnosis not present

## 2019-03-10 DIAGNOSIS — M545 Low back pain: Secondary | ICD-10-CM | POA: Diagnosis not present

## 2019-03-11 ENCOUNTER — Ambulatory Visit: Payer: Self-pay | Admitting: Internal Medicine

## 2019-03-11 ENCOUNTER — Telehealth: Payer: Self-pay | Admitting: Internal Medicine

## 2019-03-11 NOTE — Telephone Encounter (Signed)
Care everywhere 

## 2019-03-13 ENCOUNTER — Other Ambulatory Visit: Payer: Self-pay | Admitting: Adult Health

## 2019-03-13 DIAGNOSIS — M62830 Muscle spasm of back: Secondary | ICD-10-CM

## 2019-03-13 DIAGNOSIS — M549 Dorsalgia, unspecified: Secondary | ICD-10-CM

## 2019-03-14 ENCOUNTER — Other Ambulatory Visit: Payer: Self-pay | Admitting: Internal Medicine

## 2019-03-14 DIAGNOSIS — M79674 Pain in right toe(s): Secondary | ICD-10-CM | POA: Diagnosis not present

## 2019-03-14 DIAGNOSIS — Z1231 Encounter for screening mammogram for malignant neoplasm of breast: Secondary | ICD-10-CM

## 2019-03-19 ENCOUNTER — Other Ambulatory Visit: Payer: Self-pay

## 2019-03-19 ENCOUNTER — Ambulatory Visit: Payer: BC Managed Care – PPO | Admitting: Internal Medicine

## 2019-03-19 ENCOUNTER — Encounter: Payer: Self-pay | Admitting: Internal Medicine

## 2019-03-19 VITALS — BP 132/84 | HR 88 | Temp 97.2°F | Resp 16 | Ht 63.5 in | Wt 185.0 lb

## 2019-03-19 DIAGNOSIS — I10 Essential (primary) hypertension: Secondary | ICD-10-CM

## 2019-03-19 DIAGNOSIS — R7303 Prediabetes: Secondary | ICD-10-CM

## 2019-03-19 DIAGNOSIS — E782 Mixed hyperlipidemia: Secondary | ICD-10-CM | POA: Diagnosis not present

## 2019-03-19 DIAGNOSIS — Z79899 Other long term (current) drug therapy: Secondary | ICD-10-CM | POA: Diagnosis not present

## 2019-03-19 DIAGNOSIS — E559 Vitamin D deficiency, unspecified: Secondary | ICD-10-CM | POA: Diagnosis not present

## 2019-03-19 DIAGNOSIS — R7309 Other abnormal glucose: Secondary | ICD-10-CM | POA: Diagnosis not present

## 2019-03-19 DIAGNOSIS — M130 Polyarthritis, unspecified: Secondary | ICD-10-CM | POA: Diagnosis not present

## 2019-03-19 DIAGNOSIS — K219 Gastro-esophageal reflux disease without esophagitis: Secondary | ICD-10-CM

## 2019-03-19 NOTE — Patient Instructions (Signed)

## 2019-03-19 NOTE — Progress Notes (Signed)
History of Present Illness:       This very nice 58 y.o. DWF presents for 6 month follow up with HTN, HLD, Pre-Diabetes and Vitamin D Deficiency. Patient's GERD is controlled with her meds.      Patient is treated for HTN (2007) & BP has been controlled at home. Today's BP: 132/84. Patient has had no complaints of any cardiac type chest pain, palpitations, dyspnea / orthopnea / PND, dizziness, claudication, or dependent edema.      Hyperlipidemia is controlled with diet & meds. Patient denies myalgias or other med SE's. Last Lipids were not at goal: Lab Results  Component Value Date   CHOL 174 03/19/2019   HDL 48 (L) 03/19/2019   LDLCALC 99 03/19/2019   TRIG 167 (H) 03/19/2019   CHOLHDL 3.6 03/19/2019       Also, the patient has Moderate Obesity (BMI 32+) and  history of PreDiabetes (A1c 5.7% / Insulin 37 /2012)  and has had no symptoms of reactive hypoglycemia, diabetic polys, paresthesias or visual blurring.  Last A1c was Normal & at goal: Lab Results  Component Value Date   HGBA1C 5.4 03/19/2019       Further, the patient also has history of Vitamin D Deficiency  ("22" / 2008) and supplements vitamin D without any suspected side-effects. Last vitamin D was at goal: Lab Results  Component Value Date   VD25OH 74 03/19/2019   Current Outpatient Medications on File Prior to Visit  Medication Sig  . albuterol (PROVENTIL HFA;VENTOLIN HFA) 108 (90 BASE) MCG/ACT inhaler Inhale into the lungs every 6 (six) hours as needed for wheezing or shortness of breath.  Marland Kitchen aspirin 81 MG tablet Take 81 mg by mouth daily.  . budesonide-formoterol (SYMBICORT) 160-4.5 MCG/ACT inhaler Inhale 2 puffs into the lungs 2 (two) times daily.  . Cholecalciferol (VITAMIN D PO) Take 4,000 Units by mouth daily.   . cyclobenzaprine (FLEXERIL) 5 MG tablet Take 1/2 to 1 tablet 2 to 3 x /day if needed for Muscle Spasms  . levocetirizine (XYZAL) 5 MG tablet   . losartan (COZAAR) 100 MG tablet TAKE ONE TABLET BY  MOUTH EVERY DAY  . meloxicam (MOBIC) 15 MG tablet Take one daily with food for 2 weeks, can take with tylenol, can not take with aleve, iburpofen, then as needed daily for pain  . montelukast (SINGULAIR) 10 MG tablet Take 10 mg by mouth at bedtime.  . Multiple Vitamins-Minerals (MULTIVITAMIN PO) Take by mouth daily.  Marland Kitchen OVER THE COUNTER MEDICATION Bone strenght vitamin with Calcium and Magnesium  . pantoprazole (PROTONIX) 40 MG tablet TAKE 1 TABLET BY MOUTH ONCE DAILY  . pravastatin (PRAVACHOL) 40 MG tablet TAKE ONE TABLET BY MOUTH AT BEDTIME FOR CHOLESTEROL  . triamcinolone ointment (KENALOG) 0.1 % Apply 1 application topically 2 (two) times daily. (Patient taking differently: Apply 1 application topically as needed. )   No current facility-administered medications on file prior to visit.     Allergies  Allergen Reactions  . Ace Inhibitors     cough  . Azithromycin     rash  . Prednisone     High dose  . Vicodin [Hydrocodone-Acetaminophen]     Nausea/vomiting    PMHx:   Past Medical History:  Diagnosis Date  . Allergy   . Asthma   . Hyperlipidemia   . Hypertension   . Migraine   . Obesity   . Prediabetes   . Vitamin D deficiency    Immunization  History  Administered Date(s) Administered  . PPD Test 05/12/2014, 05/25/2015, 07/05/2016, 08/03/2017, 08/27/2018  . Pneumococcal-Unspecified 08/29/2009  . Tdap 08/30/2007, 08/27/2018   Past Surgical History:  Procedure Laterality Date  . DILATION AND CURETTAGE, DIAGNOSTIC / THERAPEUTIC  2009  . LYMPH NODE BIOPSY Left 1991   negative  . NEVUS EXCISION  2012   dysplastic from back   FHx:    Reviewed / unchanged  SHx:    Reviewed / unchanged   Systems Review:  Constitutional: Denies fever, chills, wt changes, headaches, insomnia, fatigue, night sweats, change in appetite. Eyes: Denies redness, blurred vision, diplopia, discharge, itchy, watery eyes.  ENT: Denies discharge, congestion, post nasal drip, epistaxis, sore  throat, earache, hearing loss, dental pain, tinnitus, vertigo, sinus pain, snoring.  CV: Denies chest pain, palpitations, irregular heartbeat, syncope, dyspnea, diaphoresis, orthopnea, PND, claudication or edema. Respiratory: denies cough, dyspnea, DOE, pleurisy, hoarseness, laryngitis, wheezing.  Gastrointestinal: Denies dysphagia, odynophagia, heartburn, reflux, water brash, abdominal pain or cramps, nausea, vomiting, bloating, diarrhea, constipation, hematemesis, melena, hematochezia  or hemorrhoids. Genitourinary: Denies dysuria, frequency, urgency, nocturia, hesitancy, discharge, hematuria or flank pain. Musculoskeletal: Denies arthralgias, myalgias, stiffness, jt. swelling, pain, limping or strain/sprain.  Skin: Denies pruritus, rash, hives, warts, acne, eczema or change in skin lesion(s). Neuro: No weakness, tremor, incoordination, spasms, paresthesia or pain. Psychiatric: Denies confusion, memory loss or sensory loss. Endo: Denies change in weight, skin or hair change.  Heme/Lymph: No excessive bleeding, bruising or enlarged lymph nodes.  Physical Exam  BP 132/84   Pulse 88   Temp (!) 97.2 F (36.2 C)   Resp 16   Ht 5' 3.5" (1.613 m)   Wt 185 lb (83.9 kg)   BMI 32.26 kg/m   Appears  well nourished, well groomed  and in no distress.  Eyes: PERRLA, EOMs, conjunctiva no swelling or erythema. Sinuses: No frontal/maxillary tenderness ENT/Mouth: EAC's clear, TM's nl w/o erythema, bulging. Nares clear w/o erythema, swelling, exudates. Oropharynx clear without erythema or exudates. Oral hygiene is good. Tongue normal, non obstructing. Hearing intact.  Neck: Supple. Thyroid not palpable. Car 2+/2+ without bruits, nodes or JVD. Chest: Respirations nl with BS clear & equal w/o rales, rhonchi, wheezing or stridor.  Cor: Heart sounds normal w/ regular rate and rhythm without sig. murmurs, gallops, clicks or rubs. Peripheral pulses normal and equal  without edema.  Abdomen: Soft & bowel  sounds normal. Non-tender w/o guarding, rebound, hernias, masses or organomegaly.  Lymphatics: Unremarkable.  Musculoskeletal: Full ROM all peripheral extremities, joint stability, 5/5 strength and normal gait.  Skin: Warm, dry without exposed rashes, lesions or ecchymosis apparent.  Neuro: Cranial nerves intact, reflexes equal bilaterally. Sensory-motor testing grossly intact. Tendon reflexes grossly intact.  Pysch: Alert & oriented x 3.  Insight and judgement nl & appropriate. No ideations.  Assessment and Plan:  1. Essential hypertension  - Continue medication, monitor blood pressure at home.  - Continue DASH diet.  Reminder to go to the ER if any CP,  SOB, nausea, dizziness, severe HA, changes vision/speech.  - CBC with Differential/Platelet - COMPLETE METABOLIC PANEL WITH GFR - Magnesium - TSH  2. Hyperlipidemia, mixed  - Continue diet/meds, exercise,& lifestyle modifications.  - Continue monitor periodic cholesterol/liver & renal functions   - Lipid panel - TSH  3. Abnormal glucose  - Hemoglobin A1c - Insulin, random  4. Vitamin D deficiency  - Continue diet, exercise  - Lifestyle modifications.  - Monitor appropriate labs. - Continue supplementation.  - VITAMIN D 25 Hydroxyl  5. Prediabetes  - Hemoglobin A1c - Insulin, random  6. Gastroesophageal reflux disease  - CBC with Differential/Platelet  7. Medication management  - CBC with Differential/Platelet - COMPLETE METABOLIC PANEL WITH GFR - Magnesium - Lipid panel - TSH - Hemoglobin A1c - Insulin, random - VITAMIN D 25 Hydroxyl  8. Polyarthritis  - Anti-DNA antibody, double-stranded - C-reactive protein - Rheumatoid factor - Anti-Smith antibody - Cyclic citrul peptide antibody, IgG        Discussed  regular exercise, BP monitoring, weight control to achieve/maintain BMI less than 25 and discussed med and SE's. Recommended labs to assess and monitor clinical status with further disposition  pending results of labs.  I discussed the assessment and treatment plan with the patient. The patient was provided an opportunity to ask questions and all were answered. The patient agreed with the plan and demonstrated an understanding of the instructions.  I provided over 30 minutes of exam, counseling, chart review and  complex critical decision making.   Kirtland Bouchard, MD

## 2019-03-20 DIAGNOSIS — M65871 Other synovitis and tenosynovitis, right ankle and foot: Secondary | ICD-10-CM | POA: Diagnosis not present

## 2019-03-20 DIAGNOSIS — M79674 Pain in right toe(s): Secondary | ICD-10-CM | POA: Diagnosis not present

## 2019-03-20 LAB — COMPLETE METABOLIC PANEL WITH GFR
AG Ratio: 1.8 (calc) (ref 1.0–2.5)
ALT: 16 U/L (ref 6–29)
AST: 14 U/L (ref 10–35)
Albumin: 4.4 g/dL (ref 3.6–5.1)
Alkaline phosphatase (APISO): 92 U/L (ref 37–153)
BUN: 19 mg/dL (ref 7–25)
CO2: 27 mmol/L (ref 20–32)
Calcium: 10.2 mg/dL (ref 8.6–10.4)
Chloride: 102 mmol/L (ref 98–110)
Creat: 0.87 mg/dL (ref 0.50–1.05)
GFR, Est African American: 85 mL/min/{1.73_m2} (ref >=60)
GFR, Est Non African American: 73 mL/min/{1.73_m2} (ref >=60)
Globulin: 2.4 g/dL (calc) (ref 1.9–3.7)
Glucose, Bld: 97 mg/dL (ref 65–99)
Potassium: 4.4 mmol/L (ref 3.5–5.3)
Sodium: 140 mmol/L (ref 135–146)
Total Bilirubin: 0.4 mg/dL (ref 0.2–1.2)
Total Protein: 6.8 g/dL (ref 6.1–8.1)

## 2019-03-20 LAB — CBC WITH DIFFERENTIAL/PLATELET
Absolute Monocytes: 503 cells/uL (ref 200–950)
Basophils Absolute: 59 cells/uL (ref 0–200)
Basophils Relative: 0.8 %
Eosinophils Absolute: 207 cells/uL (ref 15–500)
Eosinophils Relative: 2.8 %
HCT: 39 % (ref 35.0–45.0)
Hemoglobin: 13.1 g/dL (ref 11.7–15.5)
Lymphs Abs: 1894 cells/uL (ref 850–3900)
MCH: 30.1 pg (ref 27.0–33.0)
MCHC: 33.6 g/dL (ref 32.0–36.0)
MCV: 89.7 fL (ref 80.0–100.0)
MPV: 10.1 fL (ref 7.5–12.5)
Monocytes Relative: 6.8 %
Neutro Abs: 4736 cells/uL (ref 1500–7800)
Neutrophils Relative %: 64 %
Platelets: 322 10*3/uL (ref 140–400)
RBC: 4.35 10*6/uL (ref 3.80–5.10)
RDW: 12 % (ref 11.0–15.0)
Total Lymphocyte: 25.6 %
WBC: 7.4 10*3/uL (ref 3.8–10.8)

## 2019-03-20 LAB — TSH: TSH: 2.63 mIU/L (ref 0.40–4.50)

## 2019-03-20 LAB — INSULIN, RANDOM: Insulin: 21.5 u[IU]/mL — ABNORMAL HIGH

## 2019-03-20 LAB — HEMOGLOBIN A1C
Hgb A1c MFr Bld: 5.4 % of total Hgb (ref <5.7)
Mean Plasma Glucose: 108 (calc)
eAG (mmol/L): 6 (calc)

## 2019-03-20 LAB — LIPID PANEL
Cholesterol: 174 mg/dL (ref <200)
HDL: 48 mg/dL — ABNORMAL LOW (ref >=50)
LDL Cholesterol (Calc): 99 mg/dL (calc)
Non-HDL Cholesterol (Calc): 126 mg/dL (calc) (ref <130)
Total CHOL/HDL Ratio: 3.6 (calc) (ref <5.0)
Triglycerides: 167 mg/dL — ABNORMAL HIGH (ref <150)

## 2019-03-20 LAB — C-REACTIVE PROTEIN: CRP: 18 mg/L — ABNORMAL HIGH (ref <8.0)

## 2019-03-20 LAB — VITAMIN D 25 HYDROXY (VIT D DEFICIENCY, FRACTURES): Vit D, 25-Hydroxy: 74 ng/mL (ref 30–100)

## 2019-03-20 LAB — CYCLIC CITRUL PEPTIDE ANTIBODY, IGG: Cyclic Citrullin Peptide Ab: <16 UNITS

## 2019-03-20 LAB — RHEUMATOID FACTOR: Rheumatoid fact SerPl-aCnc: <14 IU/mL (ref <14)

## 2019-03-20 LAB — ANTI-SMITH ANTIBODY: ENA SM Ab Ser-aCnc: <1 AI

## 2019-03-20 LAB — ANTI-DNA ANTIBODY, DOUBLE-STRANDED: ds DNA Ab: 1 IU/mL

## 2019-03-20 LAB — MAGNESIUM: Magnesium: 2.4 mg/dL (ref 1.5–2.5)

## 2019-03-21 ENCOUNTER — Other Ambulatory Visit: Payer: Self-pay | Admitting: Internal Medicine

## 2019-03-21 DIAGNOSIS — M130 Polyarthritis, unspecified: Secondary | ICD-10-CM

## 2019-03-21 MED ORDER — DEXAMETHASONE 4 MG PO TABS: ORAL_TABLET | ORAL | 0 refills | Status: DC

## 2019-04-08 DIAGNOSIS — M79674 Pain in right toe(s): Secondary | ICD-10-CM | POA: Diagnosis not present

## 2019-04-08 DIAGNOSIS — M545 Low back pain: Secondary | ICD-10-CM | POA: Diagnosis not present

## 2019-04-08 DIAGNOSIS — M659 Synovitis and tenosynovitis, unspecified: Secondary | ICD-10-CM | POA: Diagnosis not present

## 2019-04-09 ENCOUNTER — Other Ambulatory Visit: Payer: Self-pay | Admitting: Internal Medicine

## 2019-04-28 ENCOUNTER — Ambulatory Visit
Admission: RE | Admit: 2019-04-28 | Discharge: 2019-04-28 | Disposition: A | Discharge status: Home / Self Care | Payer: BC Managed Care – PPO | Source: Ambulatory Visit | Attending: Internal Medicine | Admitting: Internal Medicine

## 2019-04-28 ENCOUNTER — Other Ambulatory Visit: Payer: Self-pay

## 2019-04-28 DIAGNOSIS — Z1231 Encounter for screening mammogram for malignant neoplasm of breast: Secondary | ICD-10-CM

## 2019-06-11 ENCOUNTER — Other Ambulatory Visit: Payer: Self-pay | Admitting: Internal Medicine

## 2019-06-11 ENCOUNTER — Telehealth: Payer: Self-pay | Admitting: *Deleted

## 2019-06-11 DIAGNOSIS — M130 Polyarthritis, unspecified: Secondary | ICD-10-CM

## 2019-06-11 MED ORDER — DEXAMETHASONE 4 MG PO TABS: ORAL_TABLET | ORAL | 0 refills | Status: DC

## 2019-06-11 NOTE — Telephone Encounter (Signed)
Patient called and reported she is still having pain in her toe. Dr Melford Aase sent in an RX for Dexamethasone. Patient is aware and has an appointment 06/26/2019.

## 2019-06-25 ENCOUNTER — Encounter: Payer: Self-pay | Admitting: Adult Health Nurse Practitioner

## 2019-06-25 NOTE — Progress Notes (Signed)
3 Month Follow Up   Assessment and Plan:   Rhonda [name] was seen today for follow-up.  Diagnoses and all orders for this visit:  Essential hypertension Continue current medications: Monitor blood pressure at home; call if consistently over 130/80 Continue DASH diet.   Reminder to go to the ER if any CP, SOB, nausea, dizziness, severe HA, changes vision/speech, left arm numbness and tingling and jaw pain. -     CBC with Diff -     COMPLETE METABOLIC PANEL WITH GFR -     TSH  Mixed hyperlipidemia Continue medications: Discussed dietary and exercise modifications Low fat diet -     Lipid Profile  Other abnormal glucose Dietary recommendations Encouraged aerobic exercise.  -     Hemoglobin A1c ([facility])  Vitamin D deficiency Continue supplementation Taking Vitamin D * IU daily -     Vitamin D (25 hydroxy)  Gastroesophageal reflux disease, unspecified whether esophagitis present Doing well at this time Continue: Protonix 40mg  daily Diet discussed Monitor for triggers Avoid food with high acid content Avoid excessive cafeine Increase water intake -     Magnesium  Uncomplicated asthma, unspecified asthma severity, unspecified whether persistent Doing well at this time, using Symbicort BID Reminder to rinse mouth after use No reports of albuterol use.  Obesity (BMI 30.0-34.9) Discussed dietary or exercise modifications  Medication management Continued  Influenza vaccination declined Discussed with patient  Need for hepatitis C screening test -     Hepatitis C Antibody  Screening for blood or protein in urine -     Urinalysis w microscopic + reflex cultur  Wound of right lower extremity, initial encounter -     cephALEXin (KEFLEX) 500 MG capsule; Take 1 capsule 4 x/day after meals & bedtime for infection Discussed wound care, see AVS Continue to monitor for increasing redness/worsening  Hand strain, right, initial encounter -     meloxicam (MOBIC) 15 MG tablet;  Take one daily with food for 2 weeks, can take with tylenol, can not take with aleve, iburpofen, then as needed daily for pain Discussed getting x-ray related to [facility]. Wants to follow up with Ervin Knack, already establish.    Continue diet and meds as discussed. Further disposition pending results of labs. Discussed med's effects and SE's.  Patient agrees with plan of care and opportunity to ask questions/voice concerns. Over 30 minutes of chart review, interview, exam, counseling, and critical decision making was performed.   Future Appointments  Date Time Provider [place]  09/29/2019  4:00 PM Unk Pinto, MD [facility] None    ----------------------------------------------------------------------------------------------------------------------  HPI 58 y.o. female  presents for 3 month follow up on HTN, HLD, Hypothyroidism, history of pre-diabetes, weight and vitamin D deficiency.    She reports she [place] one months ago [place].  She fell backward, relative behind her broke her fall and she is not sure if she caught herself with her hands down.  She reports it happened so quickly.  She also had contusion to her right shin.  She reports it hit the edge of the wood dock.  She had large bruise that accumulated with blood.  She reports she did not do any immediate treatments.  Since she had had increasing right wrist pain and edema.  She report 8/10 pain.  She reports increased pain with gripping motion.  She reports she has taken meloxicam and that provided enough relief that she is able to use her hand.  She reports pain when trying to pull her pants down to go to get dressed or use the bathroom.  Right lower extremity has 2x2 adhesive bandage.  Breakthrough serous drainage noted.  She has been cleaning this with peroxide and witch-hazel.  She then applies neosporin to the area.  She reports that she has noticed some swelling and it is  increasingly tender.  This summer she had edema to her right second toe, surrounding MTP joint. .  She reports it was the tendon.  She was seen by orthopedics for this.  Reports it has improved put still noticeable.  The pain has decreased and there is no further redness to the area.    BMI is Body mass index is 32.26 kg/m., she has been working on diet and exercise. Wt Readings from Last 3 Encounters:  06/26/19 185 lb (83.9 kg)  03/19/19 185 lb (83.9 kg)  02/24/19 184 lb 9.6 oz (83.7 kg)     HTN predates 2007 Her blood pressure has not been controlled at home, today their BP is BP: (!) 150/92.  Reports she started on decadron 4mg  for her right foot and this cause her blood pressure to spike and she felt foggy on the medication.  It was helping but she has stopped the medication.  Likely result of steroid.  She does workout. She denies any cardiac symptoms, chest pains, palpitations, shortness of breath, dizziness or lower extremity edema.     She is on cholesterol medication Pravastatin and denies myalgias.   Her cholesterol is not at goal. The cholesterol last visit was:   Lab Results  Component Value Date   CHOL 174 03/19/2019   HDL 48 (L) 03/19/2019   LDLCALC 99 03/19/2019   TRIG 167 (H) 03/19/2019   CHOLHDL 3.6 03/19/2019    She has been working on diet and exercise for prediabetes, and denies nausea, paresthesia of the feet, polydipsia, polyuria and visual disturbances. Last A1C in the office was:  Lab Results  Component Value Date   HGBA1C 5.4 03/19/2019   Patient is on Vitamin D supplement.   Lab Results  Component Value Date   VD25OH 74 03/19/2019       Current Medications:  Current Outpatient Medications on File Prior to Visit  Medication Sig  . albuterol (PROVENTIL HFA;VENTOLIN HFA) 108 (90 BASE) MCG/ACT inhaler Inhale into the lungs every 6 (six) hours as needed for wheezing or shortness of breath.  Marland Kitchen aspirin 81 MG tablet Take 81 mg by mouth daily.  .  budesonide-formoterol (SYMBICORT) 160-4.5 MCG/ACT inhaler Inhale 2 puffs into the lungs 2 (two) times daily.  . Cholecalciferol (VITAMIN D PO) Take 4,000 Units by mouth daily.   Marland Kitchen levocetirizine (XYZAL) 5 MG tablet   . losartan (COZAAR) 100 MG tablet TAKE ONE TABLET BY MOUTH EVERY DAY  . montelukast (SINGULAIR) 10 MG tablet Take 10 mg by mouth at bedtime.  . Multiple Vitamins-Minerals (MULTIVITAMIN PO) Take by mouth daily.  Marland Kitchen OVER THE COUNTER MEDICATION Bone strenght vitamin with Calcium and Magnesium  . pantoprazole (PROTONIX) 40 MG tablet TAKE 1 TABLET BY MOUTH ONCE DAILY  . pravastatin (PRAVACHOL) 40 MG tablet TAKE ONE TABLET BY MOUTH AT BEDTIME FOR CHOLESTEROL  . triamcinolone ointment (KENALOG) 0.1 % Apply 1 application topically 2 (two) times daily. (Patient taking differently: Apply 1 application topically as needed. )  . cyclobenzaprine (FLEXERIL) 5 MG tablet Take 1/2 to 1 tablet 2 to 3 x /day if needed for Muscle Spasms (Patient not taking:  Reported on 06/26/2019)  . dexamethasone (DECADRON) 4 MG tablet Take 1 tab 3 x /day for 3 days, then 2 x /day for 3 days, then 1 tab daily   No current facility-administered medications on file prior to visit.     Allergies:  Allergies  Allergen Reactions  . Ace Inhibitors     cough  . Azithromycin     rash  . Prednisone     High dose  . Vicodin [Hydrocodone-Acetaminophen]     Nausea/vomiting     Medical History:  Past Medical History:  Diagnosis Date  . Allergy   . Asthma   . Hyperlipidemia   . Hypertension   . Migraine   . Obesity   . Prediabetes   . Vitamin D deficiency     Family history- Reviewed and unchanged   Social history- Reviewed and unchanged   Patient Care Team: Unk Pinto, MD as PCP - General (Internal Medicine)   Screening Tests: Immunization History  Administered Date(s) Administered  . PPD Test 05/12/2014, 05/25/2015, 07/05/2016, 08/03/2017, 08/27/2018  . Pneumococcal-Unspecified 08/29/2009   . Tdap 08/30/2007, 08/27/2018     Vaccinations: TD or Tdap: 08/2018  Influenza: Declined Pneumococcal: 08/2009 Prevnar13: at age 84 Shingles: Zostavax/Shingrix: Discussed with patient   Preventative Care: Last colonoscopy:2017 Last mammogram: 04/2019 Last pap smear/pelvic exam: DUE DEXA: 2014 Hep C screening [id number]): Completed today HIV: 2000    Review of Systems:  Review of Systems  Constitutional: Negative for chills, diaphoresis, fever, malaise/fatigue and weight loss.  HENT: Negative for congestion, ear discharge, ear pain, hearing loss, nosebleeds, sinus pain, sore throat and tinnitus.   Eyes: Negative for blurred vision, double vision, photophobia, pain, discharge and redness.  Respiratory: Negative for cough, hemoptysis, sputum production, shortness of breath, wheezing and stridor.   Cardiovascular: Negative for chest pain, palpitations, orthopnea, claudication, leg swelling and PND.  Gastrointestinal: Negative for abdominal pain, blood in stool, constipation, diarrhea, heartburn, melena, nausea and vomiting.  Genitourinary: Negative for dysuria, flank pain, frequency, hematuria and urgency.  Musculoskeletal: Negative for back pain, falls, joint pain, myalgias and neck pain.       Right hand pain  Skin: Negative for itching and rash.       Wound to right shin  Neurological: Negative for dizziness, tingling, tremors, sensory change, speech change, focal weakness, seizures, loss of consciousness, weakness and headaches.  Endo/Heme/Allergies: Negative for environmental allergies and polydipsia. Does not bruise/bleed easily.  Psychiatric/Behavioral: Negative for depression, hallucinations, memory loss, substance abuse and suicidal ideas. The patient is not nervous/anxious and does not have insomnia.       Physical Exam: BP (!) 150/92   Pulse 82   Temp 98.1 F (36.7 C)   Ht 5' 3.5" (1.613 m)   Wt 185 lb (83.9 kg)   SpO2 96%   BMI 32.26 kg/m  Wt Readings from  Last 3 Encounters:  06/26/19 185 lb (83.9 kg)  03/19/19 185 lb (83.9 kg)  02/24/19 184 lb 9.6 oz (83.7 kg)   General Appearance: Well nourished, in no apparent distress. Eyes: PERRLA, EOMs, conjunctiva no swelling or erythema Sinuses: No Frontal/maxillary tenderness ENT/Mouth: Ext aud canals clear, TMs without erythema, bulging. No erythema, swelling, or exudate on post pharynx.  Tonsils not swollen or erythematous. Hearing normal.  Neck: Supple, thyroid normal.  Respiratory: Respiratory effort normal, BS equal bilaterally without rales, rhonchi, wheezing or stridor.  Cardio: RRR with no MRGs. Brisk peripheral pulses without edema.  Abdomen: Soft, + BS.  Non tender, no guarding,  rebound, hernias, masses. Lymphatics: Non tender without lymphadenopathy.  Musculoskeletal: Full ROM, 5/5 strength, Normal gait.  Pain to pollicis mucle group and bas of thumb specifically carpometacarpal joint.  Skin: Warm, dry without rashes, lesions, ecchymosis.  Wound 3cm x 2cm, dry crusted wound bed with yellow boarder, redness and warms to surrounding tissue, tender.  Neuro: Cranial nerves intact. No cerebellar symptoms.  Psych: Awake and oriented X 3, normal affect, Insight and Judgment appropriate.    Garnet Sierras, NP [facility] 1:57 PM

## 2019-06-25 NOTE — Patient Instructions (Addendum)
Cleanse wound with mild soap, ex: dove, and water once a day. Use bacitracin three times a day.   DO NOT USE PEROXIDE, this can prevent wound healing. Cover with clean bandage daily, change if the bandage gets wet. We want the wound to stay moist but not saturated. We are going to send in an antibiotic Cephalexin (Keflex) to help with healing.  Continue to monitor for excessive or bad smelling drainage or increase in redness around the wound.   Contact Orthopedics regarding your hand.  If you are delayed getting an appointment please let us know.     Vit D  & Vit C 1,000 mg   are recommended to help protect  against the Covid-19 and other Corona viruses.    Also it's recommended  to take  Zinc 50 mg  to help  protect against the Covid-19   and best place to get  is also on [email or web address]  and don't pay more than 6-8 cents /pill !  ================================ Coronavirus (COVID-19) Are you at risk?  Are you at risk for the Coronavirus (COVID-19)?  To be considered HIGH RISK for Coronavirus (COVID-19), you have to meet the following criteria:  . Traveled to [place], [place], [place], [place] or [place]; or in the [place] to [place], [place], [place]  . or [place]; and have fever, cough, and shortness of breath within the last 2 weeks of travel OR . Been in close contact with a person diagnosed with COVID-19 within the last 2 weeks and have  . fever, cough,and shortness of breath .  . IF YOU DO NOT MEET THESE CRITERIA, YOU ARE CONSIDERED LOW RISK FOR COVID-19.  What to do if you are HIGH RISK for COVID-19?  Marland Kitchen If you are having a medical emergency, call 911. . Seek medical care right away. Before you go to a doctor's office, urgent care or emergency department, .  call ahead and tell them about your recent travel, contact with someone diagnosed with COVID-19  .  and your symptoms.  . You should receive instructions from your physician's office regarding next  steps of care.  . When you arrive at healthcare provider, tell the healthcare staff immediately you have returned from  . visiting [place], [place], [place], [place] or [place]; or traveled in the [place] to [place], [place],  . [place] or [place] in the last two weeks or you have been in close contact with a person diagnosed with  . COVID-19 in the last 2 weeks.   . Tell the health care staff about your symptoms: fever, cough and shortness of breath. . After you have been seen by a medical provider, you will be either: o Tested for (COVID-19) and discharged home on quarantine except to seek medical care if  o symptoms worsen, and asked to  - Stay home and avoid contact with others until you get your results (4-5 days)  - Avoid travel on public transportation if possible (such as bus, train, or airplane) or o Sent to the Emergency Department by EMS for evaluation, COVID-19 testing  and  o possible admission depending on your condition and test results.  What to do if you are LOW RISK for COVID-19?  Reduce your risk of any infection by using the same precautions used for avoiding the common cold or flu:  Marland Kitchen Wash your hands often with soap and warm water for at least 20 seconds.  If soap and water are not  readily available,  . use an alcohol-based hand sanitizer with at least 60% alcohol.  . If coughing or sneezing, cover your mouth and nose by coughing or sneezing into the elbow areas of your shirt or coat, .  into a tissue or into your sleeve (not your hands). . Avoid shaking hands with others and consider head nods or verbal greetings only. . Avoid touching your eyes, nose, or mouth with unwashed hands.  . Avoid close contact with people who are sick. . Avoid places or events with large numbers of people in one location, like concerts or sporting events. . Carefully consider travel plans you have or are making. . If you are planning any travel outside or inside the [place], visit the  CDC's Travelers' Health webpage for the latest health notices. . If you have some symptoms but not all symptoms, continue to monitor at home and seek medical attention  . if your symptoms worsen. . If you are having a medical emergency, call 911. >>>>>>>>>>>>>>>>>>>>>>> Preventive Care for Adults  A healthy lifestyle and preventive care can promote health and wellness. Preventive health guidelines for women include the following key practices.  A routine yearly physical is a good way to check with your health care provider about your health and preventive screening. It is a chance to share any concerns and updates on your health and to receive a thorough exam.  Visit your dentist for a routine exam and preventive care every 6 months. Brush your teeth twice a day and floss once a day. Good oral hygiene prevents tooth decay and gum disease.  The frequency of eye exams is based on your age, health, family medical history, use of contact lenses, and other factors. Follow your health care provider's recommendations for frequency of eye exams.  Eat a healthy diet. Foods like vegetables, fruits, whole grains, low-fat dairy products, and lean protein foods contain the nutrients you need without too many calories. Decrease your intake of foods high in solid fats, added sugars, and salt. Eat the right amount of calories for you. Get information about a proper diet from your health care provider, if necessary.  Regular physical exercise is one of the most important things you can do for your health. Most adults should get at least 150 minutes of moderate-intensity exercise (any activity that increases your heart rate and causes you to sweat) each week. In addition, most adults need muscle-strengthening exercises on 2 or more days a week.  Maintain a healthy weight. The body mass index (BMI) is a screening tool to identify possible weight problems. It provides an estimate of body fat based on height and  weight. Your health care provider can find your BMI and can help you achieve or maintain a healthy weight. For adults 20 years and older:  A BMI below 18.5 is considered underweight.  A BMI of 18.5 to 24.9 is normal.  A BMI of 25 to 29.9 is considered overweight.  A BMI of 30 and above is considered obese.  Maintain normal blood lipids and cholesterol levels by exercising and minimizing your intake of saturated fat. Eat a balanced diet with plenty of fruit and vegetables. Blood tests for lipids and cholesterol should begin at age 79 and be repeated every 5 years. If your lipid or cholesterol levels are high, you are over 50, or you are at high risk for heart disease, you may need your cholesterol levels checked more frequently. Ongoing high lipid and cholesterol levels should be treated  with medicines if diet and exercise are not working.  If you smoke, find out from your health care provider how to quit. If you do not use tobacco, do not start.  Lung cancer screening is recommended for adults aged 45-80 years who are at high risk for developing lung cancer because of a history of smoking. A yearly low-dose CT scan of the lungs is recommended for people who have at least a 30-pack-year history of smoking and are a current smoker or have quit within the past 15 years. A pack year of smoking is smoking an average of 1 pack of cigarettes a day for 1 year (for example: 1 pack a day for 30 years or 2 packs a day for 15 years). Yearly screening should continue until the smoker has stopped smoking for at least 15 years. Yearly screening should be stopped for people who develop a health problem that would prevent them from having lung cancer treatment.  High blood pressure causes heart disease and increases the risk of stroke. Your blood pressure should be checked at least every 1 to 2 years. Ongoing high blood pressure should be treated with medicines if weight loss and exercise do not work.  If you are  70-63 years old, ask your health care provider if you should take aspirin to prevent strokes.  Diabetes screening involves taking a blood sample to check your fasting blood sugar level. This should be done once every 3 years, after age 83, if you are within normal weight and without risk factors for diabetes. Testing should be considered at a younger age or be carried out more frequently if you are overweight and have at least 1 risk factor for diabetes.  Breast cancer screening is essential preventive care for women. You should practice "breast self-awareness." This means understanding the normal appearance and feel of your breasts and may include breast self-examination. Any changes detected, no matter how small, should be reported to a health care provider. Women in their 64s and 30s should have a clinical breast exam (CBE) by a health care provider as part of a regular health exam every 1 to 3 years. After age 60, women should have a CBE every year. Starting at age 52, women should consider having a mammogram (breast X-ray test) every year. Women who have a family history of breast cancer should talk to their health care provider about genetic screening. Women at a high risk of breast cancer should talk to their health care providers about having an MRI and a mammogram every year.  Breast cancer gene (BRCA)-related cancer risk assessment is recommended for women who have family members with BRCA-related cancers. BRCA-related cancers include breast, ovarian, tubal, and peritoneal cancers. Having family members with these cancers may be associated with an increased risk for harmful changes (mutations) in the breast cancer genes BRCA1 and BRCA2. Results of the assessment will determine the need for genetic counseling and BRCA1 and BRCA2 testing.  Routine pelvic exams to screen for cancer are no longer recommended for nonpregnant women who are considered low risk for cancer of the pelvic organs (ovaries,  uterus, and vagina) and who do not have symptoms. Ask your health care provider if a screening pelvic exam is right for you.  If you have had past treatment for cervical cancer or a condition that could lead to cancer, you need Pap tests and screening for cancer for at least 20 years after your treatment. If Pap tests have been discontinued, your risk factors (  such as having a new sexual partner) need to be reassessed to determine if screening should be resumed. Some women have medical problems that increase the chance of getting cervical cancer. In these cases, your health care provider may recommend more frequent screening and Pap tests.  Colorectal cancer can be detected and often prevented. Most routine colorectal cancer screening begins at the age of 1 years and continues through age 95 years. However, your health care provider may recommend screening at an earlier age if you have risk factors for colon cancer. On a yearly basis, your health care provider may provide home test kits to check for hidden blood in the stool. Use of a small camera at the end of a tube, to directly examine the colon (sigmoidoscopy or colonoscopy), can detect the earliest forms of colorectal cancer. Talk to your health care provider about this at age 36, when routine screening begins.  Direct exam of the colon should be repeated every 5-10 years through age 81 years, unless early forms of pre-cancerous polyps or small growths are found.  Hepatitis C blood testing is recommended for all people born from [birth year] through 1965 and any individual with known risks for hepatitis C.  Pra  Osteoporosis is a disease in which the bones lose minerals and strength with aging. This can result in serious bone fractures or breaks. The risk of osteoporosis can be identified using a bone density scan. Women ages 89 years and over and women at risk for fractures or osteoporosis should discuss screening with their health care providers. Ask your  health care provider whether you should take a calcium supplement or vitamin D to reduce the rate of osteoporosis.  Menopause can be associated with physical symptoms and risks. Hormone replacement therapy is available to decrease symptoms and risks. You should talk to your health care provider about whether hormone replacement therapy is right for you.  Use sunscreen. Apply sunscreen liberally and repeatedly throughout the day. You should seek shade when your shadow is shorter than you. Protect yourself by wearing long sleeves, pants, a wide-brimmed hat, and sunglasses year round, whenever you are outdoors.  Once a month, do a whole body skin exam, using a mirror to look at the skin on your back. Tell your health care provider of new moles, moles that have irregular borders, moles that are larger than a pencil eraser, or moles that have changed in shape or color.  Stay current with required vaccines (immunizations).  Influenza vaccine. All adults should be immunized every year.  Tetanus, diphtheria, and acellular pertussis (Td, Tdap) vaccine. Pregnant women should receive 1 dose of Tdap vaccine during each pregnancy. The dose should be obtained regardless of the length of time since the last dose. Immunization is preferred during the 27th-36th week of gestation. An adult who has not previously received Tdap or who does not know her vaccine status should receive 1 dose of Tdap. This initial dose should be followed by tetanus and diphtheria toxoids (Td) booster doses every 10 years. Adults with an unknown or incomplete history of completing a 3-dose immunization series with Td-containing vaccines should begin or complete a primary immunization series including a Tdap dose. Adults should receive a Td booster every 10 years.  Varicella vaccine. An adult without evidence of immunity to varicella should receive 2 doses or a second dose if she has previously received 1 dose. Pregnant females who do not have  evidence of immunity should receive the first dose after pregnancy. This first  dose should be obtained before leaving the health care facility. The second dose should be obtained 4-8 weeks after the first dose.  Human papillomavirus (HPV) vaccine. Females aged 13-26 years who have not received the vaccine previously should obtain the 3-dose series. The vaccine is not recommended for use in pregnant females. However, pregnancy testing is not needed before receiving a dose. If a female is found to be pregnant after receiving a dose, no treatment is needed. In that case, the remaining doses should be delayed until after the pregnancy. Immunization is recommended for any person with an immunocompromised condition through the age of 20 years if she did not get any or all doses earlier. During the 3-dose series, the second dose should be obtained 4-8 weeks after the first dose. The third dose should be obtained 24 weeks after the first dose and 16 weeks after the second dose.  Zoster vaccine. One dose is recommended for adults aged 73 years or older unless certain conditions are present.  Measles, mumps, and rubella (MMR) vaccine. Adults born before 50 generally are considered immune to measles and mumps. Adults born in [birth year] or later should have 1 or more doses of MMR vaccine unless there is a contraindication to the vaccine or there is laboratory evidence of immunity to each of the three diseases. A routine second dose of MMR vaccine should be obtained at least 28 days after the first dose for students attending postsecondary schools, health care workers, or international travelers. People who received inactivated measles vaccine or an unknown type of measles vaccine during 1963-1967 should receive 2 doses of MMR vaccine. People who received inactivated mumps vaccine or an unknown type of mumps vaccine before 1979 and are at high risk for mumps infection should consider immunization with 2 doses of MMR vaccine.  For females of childbearing age, rubella immunity should be determined. If there is no evidence of immunity, females who are not pregnant should be vaccinated. If there is no evidence of immunity, females who are pregnant should delay immunization until after pregnancy. Unvaccinated health care workers born before 65 who lack laboratory evidence of measles, mumps, or rubella immunity or laboratory confirmation of disease should consider measles and mumps immunization with 2 doses of MMR vaccine or rubella immunization with 1 dose of MMR vaccine.  Pneumococcal 13-valent conjugate (PCV13) vaccine. When indicated, a person who is uncertain of her immunization history and has no record of immunization should receive the PCV13 vaccine. An adult aged 46 years or older who has certain medical conditions and has not been previously immunized should receive 1 dose of PCV13 vaccine. This PCV13 should be followed with a dose of pneumococcal polysaccharide (PPSV23) vaccine. The PPSV23 vaccine dose should be obtained at least 1 or more year(s) after the dose of PCV13 vaccine. An adult aged 36 years or older who has certain medical conditions and previously received 1 or more doses of PPSV23 vaccine should receive 1 dose of PCV13. The PCV13 vaccine dose should be obtained 1 or more years after the last PPSV23 vaccine dose.    Pneumococcal polysaccharide (PPSV23) vaccine. When PCV13 is also indicated, PCV13 should be obtained first. All adults aged 61 years and older should be immunized. An adult younger than age 48 years who has certain medical conditions should be immunized. Any person who resides in a nursing home or long-term care facility should be immunized. An adult smoker should be immunized. People with an immunocompromised condition and certain other conditions should receive  both PCV13 and PPSV23 vaccines. People with human immunodeficiency virus (HIV) infection should be immunized as soon as possible after  diagnosis. Immunization during chemotherapy or radiation therapy should be avoided. Routine use of PPSV23 vaccine is not recommended for American Indians, [place] Natives, or people younger than 65 years unless there are medical conditions that require PPSV23 vaccine. When indicated, people who have unknown immunization and have no record of immunization should receive PPSV23 vaccine. One-time revaccination 5 years after the first dose of PPSV23 is recommended for people aged 19-64 years who have chronic kidney failure, nephrotic syndrome, asplenia, or immunocompromised conditions. People who received 1-2 doses of PPSV23 before age 42 years should receive another dose of PPSV23 vaccine at age 14 years or later if at least 5 years have passed since the previous dose. Doses of PPSV23 are not needed for people immunized with PPSV23 at or after age 13 years.  Preventive Services / Frequency   Ages 5 to 62 years  Blood pressure check.  Lipid and cholesterol check.  Lung cancer screening. / Every year if you are aged 48-80 years and have a 30-pack-year history of smoking and currently smoke or have quit within the past 15 years. Yearly screening is stopped once you have quit smoking for at least 15 years or develop a health problem that would prevent you from having lung cancer treatment.  Clinical breast exam.** / Every year after age 20 years.   BRCA-related cancer risk assessment.** / For women who have family members with a BRCA-related cancer (breast, ovarian, tubal, or peritoneal cancers).  Mammogram.** / Every year beginning at age 34 years and continuing for as long as you are in good health. Consult with your health care provider.  Pap test.** / Every 3 years starting at age 59 years through age 67 or 50 years with a history of 3 consecutive normal Pap tests.  HPV screening.** / Every 3 years from ages 110 years through ages 74 to 44 years with a history of 3 consecutive normal Pap  tests.  Fecal occult blood test (FOBT) of stool. / Every year beginning at age 65 years and continuing until age 55 years. You may not need to do this test if you get a colonoscopy every 10 years.  Flexible sigmoidoscopy or colonoscopy.** / Every 5 years for a flexible sigmoidoscopy or every 10 years for a colonoscopy beginning at age 60 years and continuing until age 21 years.  Hepatitis C blood test.** / For all people born from [birth year] through 1967 and any individual with known risks for hepatitis C.  Skin self-exam. / Monthly.  Influenza vaccine. / Every year.  Tetanus, diphtheria, and acellular pertussis (Tdap/Td) vaccine.** / Consult your health care provider. Pregnant women should receive 1 dose of Tdap vaccine during each pregnancy. 1 dose of Td every 10 years.  Varicella vaccine.** / Consult your health care provider. Pregnant females who do not have evidence of immunity should receive the first dose after pregnancy.  Zoster vaccine.** / 1 dose for adults aged 78 years or older.  Pneumococcal 13-valent conjugate (PCV13) vaccine.** / Consult your health care provider.  Pneumococcal polysaccharide (PPSV23) vaccine.** / 1 to 2 doses if you smoke cigarettes or if you have certain conditions.  Meningococcal vaccine.** / Consult your health care provider.  Hepatitis A vaccine.** / Consult your health care provider.  Hepatitis B vaccine.** / Consult your health care provider. Screening for abdominal aortic aneurysm (AAA)  by ultrasound is recommended for people over  50 who have history of high blood pressure or who are current or former smokers. ++++++++++++++++++ Recommend Adult Low Dose Aspirin or  coated  Aspirin 81 mg daily  To reduce risk of Colon Cancer 40 %,  Skin Cancer 26 % ,  Melanoma 46%  and  Pancreatic cancer 60% +++++++++++++++++++ Vitamin D goal  is between 70-100.  Please make sure that you are taking your Vitamin D as directed.  It is very important as a  natural anti-inflammatory  helping hair, skin, and nails, as well as reducing stroke and heart attack risk.  It helps your bones and helps with mood. It also decreases numerous cancer risks so please take it as directed.  Low Vit D is associated with a 200-300% higher risk for CANCER  and 200-300% higher risk for HEART   ATTACK  &  STROKE.   .....................................Marland Kitchen It is also associated with higher death rate at younger ages,  autoimmune diseases like Rheumatoid arthritis, Lupus, Multiple Sclerosis.    Also many other serious conditions, like depression, Alzheimer's Dementia, infertility, muscle aches, fatigue, fibromyalgia - just to name a few. ++++++++++++++++++ Recommend the book "The END of DIETING" by Dr Excell Seltzer  & the book "The END of DIABETES " by Dr Excell Seltzer At [email or web address] - get book & Audio CD's    Being diabetic has a  300% increased risk for heart attack, stroke, cancer, and alzheimer- type vascular dementia. It is very important that you work harder with diet by avoiding all foods that are white. Avoid white rice (brown & wild rice is OK), white potatoes (sweetpotatoes in moderation is OK), White bread or wheat bread or anything made out of white flour like bagels, donuts, rolls, buns, biscuits, cakes, pastries, cookies, pizza crust, and pasta (made from white flour & egg whites) - vegetarian pasta or spinach or wheat pasta is OK. Multigrain breads like Arnold's or [employer], or multigrain sandwich thins or flatbreads.  Diet, exercise and weight loss can reverse and cure diabetes in the early stages.  Diet, exercise and weight loss is very important in the control and prevention of complications of diabetes which affects every system in your body, ie. Brain - dementia/stroke, eyes - glaucoma/blindness, heart - heart attack/heart failure, kidneys - dialysis, stomach - gastric paralysis, intestines - malabsorption, nerves - severe painful neuritis, circulation -  gangrene & loss of a leg(s), and finally cancer and Alzheimers.    I recommend avoid fried & greasy foods,  sweets/candy, white rice (brown or wild rice or Quinoa is OK), white potatoes (sweet potatoes are OK) - anything made from white flour - bagels, doughnuts, rolls, buns, biscuits,white and wheat breads, pizza crust and traditional pasta made of white flour & egg white(vegetarian pasta or spinach or wheat pasta is OK).  Multi-grain bread is OK - like multi-grain flat bread or sandwich thins. Avoid alcohol in excess. Exercise is also important.    Eat all the vegetables you want - avoid meat, especially red meat and dairy - especially cheese.  Cheese is the most concentrated form of trans-fats which is the worst thing to clog up our arteries. Veggie cheese is OK which can be found in the fresh produce section at [employer] or [employer] or [employer]  ++++++++++++++++++++++ DASH Eating Plan  DASH stands for "Dietary Approaches to Stop Hypertension."   The DASH eating plan is a healthy eating plan that has been shown to reduce high blood pressure (hypertension). Additional health benefits may include reducing  the risk of type 2 diabetes mellitus, heart disease, and stroke. The DASH eating plan may also help with weight loss. WHAT DO I NEED TO KNOW ABOUT THE DASH EATING PLAN? For the DASH eating plan, you will follow these general guidelines:  Choose foods with a percent daily value for sodium of less than 5% (as listed on the food label).  Use salt-free seasonings or herbs instead of table salt or sea salt.  Check with your health care provider or pharmacist before using salt substitutes.  Eat lower-sodium products, often labeled as "lower sodium" or "no salt added."  Eat fresh foods.  Eat more vegetables, fruits, and low-fat dairy products.  Choose whole grains. Look for the word "whole" as the first word in the ingredient list.  Choose fish   Limit sweets, desserts, sugars, and  sugary drinks.  Choose heart-healthy fats.  Eat veggie cheese   Eat more home-cooked food and less restaurant, buffet, and fast food.  Limit fried foods.  Cook foods using methods other than frying.  Limit canned vegetables. If you do use them, rinse them well to decrease the sodium.  When eating at a restaurant, ask that your food be prepared with less salt, or no salt if possible.                      WHAT FOODS CAN I EAT? Read Dr Fara Olden Fuhrman's books on The End of Dieting & The End of Diabetes  Grains Whole grain or whole wheat bread. Brown rice. Whole grain or whole wheat pasta. Quinoa, bulgur, and whole grain cereals. Low-sodium cereals. Corn or whole wheat flour tortillas. Whole grain cornbread. Whole grain crackers. Low-sodium crackers.  Vegetables Fresh or frozen vegetables (raw, steamed, roasted, or grilled). Low-sodium or reduced-sodium tomato and vegetable juices. Low-sodium or reduced-sodium tomato sauce and paste. Low-sodium or reduced-sodium canned vegetables.   Fruits All fresh, canned (in natural juice), or frozen fruits.  Protein Products  All fish and seafood.  Dried beans, peas, or lentils. Unsalted nuts and seeds. Unsalted canned beans.  Dairy Low-fat dairy products, such as skim or 1% milk, 2% or reduced-fat cheeses, low-fat ricotta or cottage cheese, or plain low-fat yogurt. Low-sodium or reduced-sodium cheeses.  Fats and Oils Tub margarines without trans fats. Light or reduced-fat mayonnaise and salad dressings (reduced sodium). Avocado. Safflower, olive, or canola oils. Natural peanut or almond butter.  Other Unsalted popcorn and pretzels. The items listed above may not be a complete list of recommended foods or beverages. Contact your dietitian for more options.  ++++++++++++++++++  WHAT FOODS ARE NOT RECOMMENDED? Grains/ White flour or wheat flour White bread. White pasta. White rice. Refined cornbread. Bagels and croissants. Crackers that  contain trans fat.  Vegetables  Creamed or fried vegetables. Vegetables in a . Regular canned vegetables. Regular canned tomato sauce and paste. Regular tomato and vegetable juices.  Fruits Dried fruits. Canned fruit in light or heavy syrup. Fruit juice.  Meat and Other Protein Products Meat in general - RED meat & White meat.  Fatty cuts of meat. Ribs, chicken wings, all processed meats as bacon, sausage, bologna, salami, fatback, hot dogs, bratwurst and packaged luncheon meats.  Dairy Whole or 2% milk, cream, half-and-half, and cream cheese. Whole-fat or sweetened yogurt. Full-fat cheeses or blue cheese. Non-dairy creamers and whipped toppings. Processed cheese, cheese spreads, or cheese curds.  Condiments Onion and garlic salt, seasoned salt, table salt, and sea salt. Canned and packaged gravies. Worcestershire sauce. Tartar  sauce. Barbecue sauce. Teriyaki sauce. Soy sauce, including reduced sodium. Steak sauce. Fish sauce. Oyster sauce. Cocktail sauce. Horseradish. Ketchup and mustard. Meat flavorings and tenderizers. Bouillon cubes. Hot sauce. Tabasco sauce. Marinades. Taco seasonings. Relishes.  Fats and Oils Butter, stick margarine, lard, shortening and bacon fat. Coconut, palm kernel, or palm oils. Regular salad dressings.  Pickles and olives. Salted popcorn and pretzels.  The items listed above may not be a complete list of foods and beverages to avoid.

## 2019-06-26 ENCOUNTER — Other Ambulatory Visit: Payer: Self-pay

## 2019-06-26 ENCOUNTER — Ambulatory Visit: Payer: BC Managed Care – PPO | Admitting: Adult Health Nurse Practitioner

## 2019-06-26 VITALS — BP 150/92 | HR 82 | Temp 98.1°F | Ht 63.5 in | Wt 185.0 lb

## 2019-06-26 DIAGNOSIS — R7309 Other abnormal glucose: Secondary | ICD-10-CM

## 2019-06-26 DIAGNOSIS — Z1159 Encounter for screening for other viral diseases: Secondary | ICD-10-CM

## 2019-06-26 DIAGNOSIS — I10 Essential (primary) hypertension: Secondary | ICD-10-CM

## 2019-06-26 DIAGNOSIS — Z1389 Encounter for screening for other disorder: Secondary | ICD-10-CM

## 2019-06-26 DIAGNOSIS — E559 Vitamin D deficiency, unspecified: Secondary | ICD-10-CM

## 2019-06-26 DIAGNOSIS — Z79899 Other long term (current) drug therapy: Secondary | ICD-10-CM

## 2019-06-26 DIAGNOSIS — S81801A Unspecified open wound, right lower leg, initial encounter: Secondary | ICD-10-CM

## 2019-06-26 DIAGNOSIS — Z23 Encounter for immunization: Secondary | ICD-10-CM

## 2019-06-26 DIAGNOSIS — K219 Gastro-esophageal reflux disease without esophagitis: Secondary | ICD-10-CM

## 2019-06-26 DIAGNOSIS — M549 Dorsalgia, unspecified: Secondary | ICD-10-CM

## 2019-06-26 DIAGNOSIS — E782 Mixed hyperlipidemia: Secondary | ICD-10-CM

## 2019-06-26 DIAGNOSIS — S66911A Strain of unspecified muscle, fascia and tendon at wrist and hand level, right hand, initial encounter: Secondary | ICD-10-CM

## 2019-06-26 DIAGNOSIS — E66811 Obesity, class 1: Secondary | ICD-10-CM

## 2019-06-26 DIAGNOSIS — J45909 Unspecified asthma, uncomplicated: Secondary | ICD-10-CM

## 2019-06-26 DIAGNOSIS — E669 Obesity, unspecified: Secondary | ICD-10-CM

## 2019-06-26 DIAGNOSIS — Z2821 Immunization not carried out because of patient refusal: Secondary | ICD-10-CM

## 2019-06-26 MED ORDER — CEPHALEXIN 500 MG PO CAPS: ORAL_CAPSULE | ORAL | 0 refills | Status: DC

## 2019-06-26 MED ORDER — MELOXICAM 15 MG PO TABS: ORAL_TABLET | ORAL | 1 refills | Status: DC

## 2019-06-27 DIAGNOSIS — M79644 Pain in right finger(s): Secondary | ICD-10-CM | POA: Diagnosis not present

## 2019-06-27 DIAGNOSIS — S6991XA Unspecified injury of right wrist, hand and finger(s), initial encounter: Secondary | ICD-10-CM | POA: Diagnosis not present

## 2019-06-27 LAB — NO CULTURE INDICATED

## 2019-06-27 LAB — COMPLETE METABOLIC PANEL WITH GFR
AG Ratio: 2.3 (calc) (ref 1.0–2.5)
ALT: 22 U/L (ref 6–29)
AST: 17 U/L (ref 10–35)
Albumin: 4.4 g/dL (ref 3.6–5.1)
Alkaline phosphatase (APISO): 72 U/L (ref 37–153)
BUN: 19 mg/dL (ref 7–25)
CO2: 28 mmol/L (ref 20–32)
Calcium: 9.9 mg/dL (ref 8.6–10.4)
Chloride: 106 mmol/L (ref 98–110)
Creat: 0.76 mg/dL (ref 0.50–0.99)
GFR, Est African American: 99 mL/min/{1.73_m2} (ref >=60)
GFR, Est Non African American: 85 mL/min/{1.73_m2} (ref >=60)
Globulin: 1.9 g/dL (calc) (ref 1.9–3.7)
Glucose, Bld: 95 mg/dL (ref 65–99)
Potassium: 4.3 mmol/L (ref 3.5–5.3)
Sodium: 141 mmol/L (ref 135–146)
Total Bilirubin: 0.4 mg/dL (ref 0.2–1.2)
Total Protein: 6.3 g/dL (ref 6.1–8.1)

## 2019-06-27 LAB — URINALYSIS W MICROSCOPIC + REFLEX CULTURE
Bacteria, UA: NONE SEEN /HPF
Bilirubin Urine: NEGATIVE
Glucose, UA: NEGATIVE
Hgb urine dipstick: NEGATIVE
Hyaline Cast: NONE SEEN /LPF
Ketones, ur: NEGATIVE
Leukocyte Esterase: NEGATIVE
Nitrites, Initial: NEGATIVE
Protein, ur: NEGATIVE
RBC / HPF: NONE SEEN /HPF (ref 0–2)
Specific Gravity, Urine: 1.014 (ref 1.001–1.03)
Squamous Epithelial / HPF: NONE SEEN /HPF (ref <=5)
WBC, UA: NONE SEEN /HPF (ref 0–5)
pH: >=8.5 — AB (ref 5.0–8.0)

## 2019-06-27 LAB — HEMOGLOBIN A1C
Hgb A1c MFr Bld: 5.3 % of total Hgb (ref <5.7)
Mean Plasma Glucose: 105 (calc)
eAG (mmol/L): 5.8 (calc)

## 2019-06-27 LAB — CBC WITH DIFFERENTIAL/PLATELET
Absolute Monocytes: 460 cells/uL (ref 200–950)
Basophils Absolute: 80 cells/uL (ref 0–200)
Basophils Relative: 1.1 %
Eosinophils Absolute: 146 cells/uL (ref 15–500)
Eosinophils Relative: 2 %
HCT: 40.9 % (ref 35.0–45.0)
Hemoglobin: 13.5 g/dL (ref 11.7–15.5)
Lymphs Abs: 1635 cells/uL (ref 850–3900)
MCH: 29.8 pg (ref 27.0–33.0)
MCHC: 33 g/dL (ref 32.0–36.0)
MCV: 90.3 fL (ref 80.0–100.0)
MPV: 10.3 fL (ref 7.5–12.5)
Monocytes Relative: 6.3 %
Neutro Abs: 4979 cells/uL (ref 1500–7800)
Neutrophils Relative %: 68.2 %
Platelets: 248 10*3/uL (ref 140–400)
RBC: 4.53 10*6/uL (ref 3.80–5.10)
RDW: 13.1 % (ref 11.0–15.0)
Total Lymphocyte: 22.4 %
WBC: 7.3 10*3/uL (ref 3.8–10.8)

## 2019-06-27 LAB — LIPID PANEL
Cholesterol: 179 mg/dL (ref <200)
HDL: 47 mg/dL — ABNORMAL LOW (ref >=50)
LDL Cholesterol (Calc): 102 mg/dL (calc) — ABNORMAL HIGH
Non-HDL Cholesterol (Calc): 132 mg/dL (calc) — ABNORMAL HIGH (ref <130)
Total CHOL/HDL Ratio: 3.8 (calc) (ref <5.0)
Triglycerides: 182 mg/dL — ABNORMAL HIGH (ref <150)

## 2019-06-27 LAB — HEPATITIS C ANTIBODY
Hepatitis C Ab: NONREACTIVE
SIGNAL TO CUT-OFF: 0.01 (ref <1.00)

## 2019-06-27 LAB — MAGNESIUM: Magnesium: 2.3 mg/dL (ref 1.5–2.5)

## 2019-06-27 LAB — TSH: TSH: 1.62 mIU/L (ref 0.40–4.50)

## 2019-06-27 LAB — VITAMIN D 25 HYDROXY (VIT D DEFICIENCY, FRACTURES): Vit D, 25-Hydroxy: 61 ng/mL (ref 30–100)

## 2019-07-02 ENCOUNTER — Other Ambulatory Visit: Payer: Self-pay | Admitting: Internal Medicine

## 2019-08-20 DIAGNOSIS — H5213 Myopia, bilateral: Secondary | ICD-10-CM | POA: Diagnosis not present

## 2019-09-12 ENCOUNTER — Encounter: Payer: Self-pay | Admitting: Internal Medicine

## 2019-09-29 ENCOUNTER — Encounter: Payer: Self-pay | Admitting: Internal Medicine

## 2019-09-29 ENCOUNTER — Encounter: Payer: BC Managed Care – PPO | Admitting: Internal Medicine

## 2019-10-06 ENCOUNTER — Other Ambulatory Visit: Payer: Self-pay | Admitting: Internal Medicine

## 2019-11-12 DIAGNOSIS — J4530 Mild persistent asthma, uncomplicated: Secondary | ICD-10-CM | POA: Diagnosis not present

## 2019-11-12 DIAGNOSIS — J301 Allergic rhinitis due to pollen: Secondary | ICD-10-CM | POA: Diagnosis not present

## 2019-11-12 DIAGNOSIS — J3089 Other allergic rhinitis: Secondary | ICD-10-CM | POA: Diagnosis not present

## 2019-11-12 DIAGNOSIS — H1045 Other chronic allergic conjunctivitis: Secondary | ICD-10-CM | POA: Diagnosis not present

## 2019-11-17 DIAGNOSIS — H43812 Vitreous degeneration, left eye: Secondary | ICD-10-CM | POA: Diagnosis not present

## 2019-11-28 ENCOUNTER — Other Ambulatory Visit: Payer: Self-pay | Admitting: Internal Medicine

## 2019-11-28 DIAGNOSIS — M549 Dorsalgia, unspecified: Secondary | ICD-10-CM

## 2019-11-28 MED ORDER — MELOXICAM 15 MG PO TABS: ORAL_TABLET | ORAL | 0 refills | Status: DC

## 2019-12-11 DIAGNOSIS — H43812 Vitreous degeneration, left eye: Secondary | ICD-10-CM | POA: Diagnosis not present

## 2019-12-16 ENCOUNTER — Encounter: Payer: BC Managed Care – PPO | Admitting: Internal Medicine

## 2019-12-17 ENCOUNTER — Encounter: Payer: BC Managed Care – PPO | Admitting: Internal Medicine

## 2019-12-19 ENCOUNTER — Encounter: Payer: Self-pay | Admitting: Physician Assistant

## 2019-12-19 ENCOUNTER — Ambulatory Visit: Payer: BC Managed Care – PPO | Admitting: Physician Assistant

## 2019-12-19 ENCOUNTER — Other Ambulatory Visit: Payer: Self-pay

## 2019-12-19 VITALS — BP 128/76 | HR 76 | Temp 97.9°F | Ht 64.0 in | Wt 192.2 lb

## 2019-12-19 DIAGNOSIS — I10 Essential (primary) hypertension: Secondary | ICD-10-CM

## 2019-12-19 DIAGNOSIS — Z130 Encounter for screening for diseases of the blood and blood-forming organs and certain disorders involving the immune mechanism: Secondary | ICD-10-CM | POA: Diagnosis not present

## 2019-12-19 DIAGNOSIS — Z131 Encounter for screening for diabetes mellitus: Secondary | ICD-10-CM

## 2019-12-19 DIAGNOSIS — Z0000 Encounter for general adult medical examination without abnormal findings: Secondary | ICD-10-CM | POA: Diagnosis not present

## 2019-12-19 DIAGNOSIS — E559 Vitamin D deficiency, unspecified: Secondary | ICD-10-CM | POA: Diagnosis not present

## 2019-12-19 DIAGNOSIS — R7309 Other abnormal glucose: Secondary | ICD-10-CM

## 2019-12-19 DIAGNOSIS — Z1329 Encounter for screening for other suspected endocrine disorder: Secondary | ICD-10-CM

## 2019-12-19 DIAGNOSIS — Z1389 Encounter for screening for other disorder: Secondary | ICD-10-CM

## 2019-12-19 DIAGNOSIS — K219 Gastro-esophageal reflux disease without esophagitis: Secondary | ICD-10-CM

## 2019-12-19 DIAGNOSIS — J45909 Unspecified asthma, uncomplicated: Secondary | ICD-10-CM

## 2019-12-19 DIAGNOSIS — E669 Obesity, unspecified: Secondary | ICD-10-CM

## 2019-12-19 DIAGNOSIS — Z136 Encounter for screening for cardiovascular disorders: Secondary | ICD-10-CM

## 2019-12-19 DIAGNOSIS — E66811 Obesity, class 1: Secondary | ICD-10-CM

## 2019-12-19 DIAGNOSIS — Z0001 Encounter for general adult medical examination with abnormal findings: Secondary | ICD-10-CM

## 2019-12-19 DIAGNOSIS — Z13220 Encounter for screening for lipoid disorders: Secondary | ICD-10-CM | POA: Diagnosis not present

## 2019-12-19 DIAGNOSIS — Z79899 Other long term (current) drug therapy: Secondary | ICD-10-CM

## 2019-12-19 DIAGNOSIS — E782 Mixed hyperlipidemia: Secondary | ICD-10-CM

## 2019-12-19 DIAGNOSIS — B351 Tinea unguium: Secondary | ICD-10-CM

## 2019-12-19 MED ORDER — TERBINAFINE HCL 250 MG PO TABS: 250.0000 mg | ORAL_TABLET | Freq: Every day | ORAL | 0 refills | Status: AC

## 2019-12-19 NOTE — Progress Notes (Signed)
COMPLETE PHYSICAL  Assessment and Plan:   Encounter for general adult medical examination with abnormal findings -     CBC with Differential/Platelet -     COMPLETE METABOLIC PANEL WITH GFR -     TSH -     Lipid panel -     Hemoglobin A1c -     Magnesium -     VITAMIN D 25 Hydroxy (Vit-D Deficiency, Fractures) -     Urinalysis, Routine w reflex microscopic -     Microalbumin / creatinine urine ratio -     Iron,Total/Total Iron Binding Cap -     Vitamin B12 -     EKG 12-Lead  Essential hypertension - continue medications, DASH diet, exercise and monitor at home. Call if greater than 130/80.  -     CBC with Differential/Platelet -     COMPLETE METABOLIC PANEL WITH GFR -     TSH -     Urinalysis, Routine w reflex microscopic -     Microalbumin / creatinine urine ratio -     EKG 12-Lead  Mixed hyperlipidemia check lipids decrease fatty foods increase activity.  -     Lipid panel  Other abnormal glucose -     Hemoglobin A1c Discussed disease progression and risks Discussed diet/exercise, weight management and risk modification  Medication management -     Magnesium  Vitamin D deficiency -     VITAMIN D 25 Hydroxy (Vit-D Deficiency, Fractures)  Obesity (BMI 30.0-34.9) - follow up 3 months for progress monitoring - increase veggies, decrease carbs - long discussion about weight loss, diet, and exercise  Gastroesophageal reflux disease, unspecified whether esophagitis present Continue PPI/H2 blocker, diet discussed  Uncomplicated asthma, unspecified asthma severity, unspecified whether persistent Continue meds  Screening, anemia, deficiency, iron -     Iron,Total/Total Iron Binding Cap -     Vitamin B12  Onychomycosis of toenail Will treat for 3 months, follow up for 6 weeks lab only LFTS If not better will refer to podiatrist -     terbinafine (LAMISIL) 250 MG tablet; Take 1 tablet (250 mg total) by mouth daily. Take one daily for 3 months, need liver  function lab at 6 weeks.    Continue diet and meds as discussed. Further disposition pending results of labs. Discussed med's effects and SE's.   Over 30 minutes of exam, counseling, chart review, and critical decision making was performed.   Future Appointments  Date Time Provider [place]  03/22/2020  8:45 AM Vicie Mutters, PA-C [facility] None  12/21/2020  3:00 PM Unk Pinto, MD [facility] None    ----------------------------------------------------------------------------------------------------------------------  HPI 59 y.o. female  presents for CPE and 3 month follow up on hypertension, cholesterol, glucose management, obesity and vitamin D deficiency.   She is right handed, had a fall, she has right thumb pain with knot since fall, had normal Xray. Mobic helped.   She has asthma currently well controlled on Symbicort and Singulair. She uses albuterol rarely.  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    BMI is Body mass index is 32.99 kg/m., she has been working on diet and exercise, but reports stressful few months and feels she has been stress eating. She is getting back on the next 72  days diet, goal to get back down to 145 lb.  Wt Readings from Last 3 Encounters:  12/19/19 192 lb 3.2 oz (87.2 kg)  06/26/19 185 lb (83.9 kg)  03/19/19 185 lb (83.9 kg)   Her blood pressure has been controlled at home, today their BP is BP: 128/76  She does workout. She denies chest pain, shortness of breath, dizziness.   She is on cholesterol medication (pravastatin 40 mg daily) and denies myalgias. Her cholesterol is not at goal. The cholesterol last visit was:   Lab Results  Component Value Date   CHOL 179 06/26/2019   HDL 47 (L)  06/26/2019   LDLCALC 102 (H) 06/26/2019   TRIG 182 (H) 06/26/2019   CHOLHDL 3.8 06/26/2019    She has been working on diet and exercise for glucose management, and denies foot ulcerations, increased appetite, nausea, paresthesia of the feet, polydipsia, polyuria, visual disturbances, vomiting and weight loss. Last A1C in the office was:  Lab Results  Component Value Date   HGBA1C 5.3 06/26/2019   Patient is on Vitamin D supplement and at goal at recent check:    Lab Results  Component Value Date   VD25OH 61 06/26/2019       Current Medications:  Current Outpatient Medications on File Prior to Visit  Medication Sig  . albuterol (PROVENTIL HFA;VENTOLIN HFA) 108 (90 BASE) MCG/ACT inhaler Inhale into the lungs every 6 (six) hours as needed for wheezing or shortness of breath.  Marland Kitchen aspirin 81 MG tablet Take 81 mg by mouth daily.  . budesonide-formoterol (SYMBICORT) 160-4.5 MCG/ACT inhaler Inhale 2 puffs into the lungs 2 (two) times daily.  . Cholecalciferol (VITAMIN D PO) Take 4,000 Units by mouth daily.   . cyclobenzaprine (FLEXERIL) 5 MG tablet Take 1/2 to 1 tablet 2 to 3 x /day if needed for Muscle Spasms  . levocetirizine (XYZAL) 5 MG tablet   . losartan (COZAAR) 100 MG tablet Take 1 tablet Daily for BP  . meloxicam (MOBIC) 15 MG tablet Take 1/2 to 1 tablet Daily with Food for Pain & Inflammation & try limit to 5 days /week to Avoid Kidney Damage  . montelukast (SINGULAIR) 10 MG tablet Take 10 mg by mouth at bedtime.  . Multiple Vitamins-Minerals (MULTIVITAMIN PO) Take by mouth daily.  Marland Kitchen OVER THE COUNTER MEDICATION Bone strenght vitamin with Calcium and Magnesium  . pantoprazole (PROTONIX) 40 MG tablet TAKE 1 TABLET ONCE DAILY  . pravastatin (PRAVACHOL) 40 MG tablet Take 1 tablet at Bedtime for Cholesterol  . triamcinolone ointment (KENALOG) 0.1 % Apply 1 application topically 2 (two) times daily. (Patient taking differently: Apply 1 application topically as needed. )   No current  facility-administered medications on file prior to visit.   Immunization History  Administered Date(s) Administered  . PPD Test 05/12/2014, 05/25/2015, 07/05/2016, 08/03/2017, 08/27/2018  . Pneumococcal-Unspecified 08/29/2009  . Tdap 08/30/2007, 08/27/2018   TDAP 2020 Flu no COVID declines vaccines Shingrix discussed  PAP: Dr. Dema Severin q 5 years MGM 04/2019 DEXA 2014 [place] AB 2004 Colonoscopy Dr. Melina Copa in [place] 2-3 years ago  Patient Care Team: Unk Pinto, MD as PCP - General (Internal Medicine)   Allergies:  Allergies  Allergen Reactions  . Ace Inhibitors     cough  . Azithromycin     rash  . Prednisone     High dose  . Vicodin [Hydrocodone-Acetaminophen]     Nausea/vomiting     Medical History:  Past Medical History:  Diagnosis Date  . Allergy   .  Asthma   . Hyperlipidemia   . Hypertension   . Migraine   . Obesity   . Prediabetes   . Vitamin D deficiency    SURGICAL HISTORY She  has a past surgical history that includes Dilation and curettage, diagnostic / therapeutic (2009); Nevus excision (2012); and Lymph node biopsy (Left, 1991). FAMILY HISTORY Her family history includes COPD in her father; Cancer in her father; Heart disease in her father; Hyperlipidemia in her father; Hypertension in her father and mother; Stroke in her father.  Mom with dementia, 15, lives in assisted living, has boyfriend SOCIAL HISTORY She  reports that she has never smoked. She has never used smokeless tobacco. She reports that she does not drink alcohol or use drugs.    Review of Systems:  Review of Systems  Constitutional: Negative for malaise/fatigue and weight loss.  HENT: Negative for hearing loss and tinnitus.   Eyes: Negative for blurred vision and double vision.  Respiratory: Negative for cough, shortness of breath and wheezing.   Cardiovascular: Negative for chest pain, palpitations, orthopnea, claudication and leg swelling.  Gastrointestinal: Negative for  abdominal pain, blood in stool, constipation, diarrhea, heartburn, melena, nausea and vomiting.  Genitourinary: Negative.   Musculoskeletal: Negative for falls, joint pain, myalgias and neck pain.  Skin: Negative for rash.  Neurological: Negative for dizziness, tingling, sensory change, weakness and headaches.  Endo/Heme/Allergies: Negative for polydipsia.  Psychiatric/Behavioral: Negative.   All other systems reviewed and are negative.   Physical Exam: BP 128/76   Pulse 76   Temp 97.9 F (36.6 C)   Ht 5\' 4"  (1.626 m)   Wt 192 lb 3.2 oz (87.2 kg)   SpO2 98%   BMI 32.99 kg/m  Wt Readings from Last 3 Encounters:  12/19/19 192 lb 3.2 oz (87.2 kg)  06/26/19 185 lb (83.9 kg)  03/19/19 185 lb (83.9 kg)   General Appearance: Well nourished, in no apparent distress. Eyes: PERRLA, EOMs, conjunctiva no swelling or erythema Sinuses: No Frontal/maxillary tenderness ENT/Mouth: Ext aud canals clear, TMs without erythema, bulging. No erythema, swelling, or exudate on post pharynx.  Tonsils not swollen or erythematous. Hearing normal.  Neck: Supple, thyroid normal.  Respiratory: Respiratory effort normal, BS equal bilaterally without rales, rhonchi, wheezing or stridor.  Cardio: RRR with no MRGs. Brisk peripheral pulses without edema.  Abdomen: Soft, + BS.  Mildly tender throughout except center, no guarding, rebound, hernias, masses. Lymphatics: Non tender without lymphadenopathy.  Musculoskeletal: Full ROM without effusion, obvious deformity. Normal gait.   Skin: Bilateral big toe nails with thickening, brittle and yellow, no erythema.  Warm, dry without rashes, lesions, ecchymosis.  Neuro: Cranial nerves intact. No cerebellar symptoms.  Psych: Awake and oriented X 3, normal affect, Insight and Judgment appropriate.    Vicie Mutters, PA-C 12:10 PM [facility]

## 2019-12-19 NOTE — Patient Instructions (Addendum)
TUMERIC FOR INFLAMMATION  Tumeric with black pepper extract is a great natural antiinflammatory that helps with arthritis and aches and pain. Can get from [employer] or any health food store. Need to take at least 800mg  twice a day with food TO [place].   Can do cinnamon 1000mg  BID  Can get the voltern gel and use 3-4 x a day Get stress ball and squeeze while your hand is under warm water  Ask insurance and pharmacy about shingrix - it is a 2 part shot that we will not be getting in the office.   This shot can make you feel bad due to such good immune response it can trigger some inflammation so take tylenol or aleve day of or day after and plan on resting.   Can go to [email or web address] for more information  Shingrix Vaccination  Two vaccines are licensed and recommended to prevent shingles in the [place] Zoster vaccine live (ZVL, Zostavax) has been in use since 2006. Recombinant zoster vaccine (RZV, Shingrix), has been in use since 2017 and is recommended by ACIP as the preferred shingles vaccine.  What Everyone Should Know about Shingles Vaccine (Shingrix) One of the Recommended Vaccines by Disease Shingles vaccination is the only way to protect against shingles and postherpetic neuralgia (PHN), the most common complication from shingles. CDC recommends that healthy adults 50 years and older get two doses of the shingles vaccine called Shingrix (recombinant zoster vaccine), separated by 2 to 6 months, to prevent shingles and the complications from the disease. Your doctor or pharmacist can give you Shingrix as a shot in your upper arm. Shingrix provides strong protection against shingles and PHN. Two doses of Shingrix is more than 90% effective at preventing shingles and PHN. Protection stays above 85% for at least the first four years after you get vaccinated. Shingrix is the preferred vaccine, over Zostavax (zoster vaccine live), a  shingles vaccine in use since 2006. Zostavax may still be used to prevent shingles in healthy adults 60 years and older. For example, you could use Zostavax if a person is allergic to Shingrix, prefers Zostavax, or requests immediate vaccination and Shingrix is unavailable. Who Should Get Shingrix? Healthy adults 50 years and older should get two doses of Shingrix, separated by 2 to 6 months. You should get Shingrix even if in the past you . had shingles  . received Zostavax  . are not sure if you had chickenpox There is no maximum age for getting Shingrix. If you had shingles in the past, you can get Shingrix to help prevent future occurrences of the disease. There is no specific length of time that you need to wait after having shingles before you can receive Shingrix, but generally you should make sure the shingles rash has gone away before getting vaccinated. You can get Shingrix whether or not you remember having had chickenpox in the past. Studies show that more than 99% of Americans 40 years and older have had chickenpox, even if they don't remember having the disease. Chickenpox and shingles are related because they are caused by the same virus (varicella zoster virus). After a person recovers from chickenpox, the virus stays dormant (inactive) in the body. It can reactivate years later and cause shingles. If you had Zostavax in the recent past, you should wait at least eight weeks before getting Shingrix. Talk to your healthcare provider to determine the best time to get Shingrix. Shingrix is available in [employer] and pharmacies. To find doctor's offices  or pharmacies near you that offer the vaccine, visit HealthMap Vaccine FinderExternal. If you have questions about Shingrix, talk with your healthcare provider. Vaccine for Those 63 Years and Older  Shingrix reduces the risk of shingles and PHN by more than 90% in people 37 and older. CDC recommends the vaccine for healthy adults 46 and  older.  Who Should Not Get Shingrix? You should not get Shingrix if you: . have ever had a severe allergic reaction to any component of the vaccine or after a dose of Shingrix  . tested negative for immunity to varicella zoster virus. If you test negative, you should get chickenpox vaccine.  . currently have shingles  . currently are pregnant or breastfeeding. Women who are pregnant or breastfeeding should wait to get Shingrix.  Marland Kitchen receive specific antiviral drugs (acyclovir, famciclovir, or valacyclovir) 24 hours before vaccination (avoid use of these antiviral drugs for 14 days after vaccination)- zoster vaccine live only If you have a minor acute (starts suddenly) illness, such as a cold, you may get Shingrix. But if you have a moderate or severe acute illness, you should usually wait until you recover before getting the vaccine. This includes anyone with a temperature of 101.33F or higher. The side effects of the Shingrix are temporary, and usually last 2 to 3 days. While you may experience pain for a few days after getting Shingrix, the pain will be less severe than having shingles and the complications from the disease. How Well Does Shingrix Work? Two doses of Shingrix provides strong protection against shingles and postherpetic neuralgia (PHN), the most common complication of shingles. . In adults 99 to 61 years old who got two doses, Shingrix was 97% effective in preventing shingles; among adults 70 years and older, Shingrix was 91% effective.  . In adults 53 to 61 years old who got two doses, Shingrix was 91% effective in preventing PHN; among adults 70 years and older, Shingrix was 89% effective. Shingrix protection remained high (more than 85%) in people 70 years and older throughout the four years following vaccination. Since your risk of shingles and PHN increases as you get older, it is important to have strong protection against shingles in your older years. Top of Page  What Are the  Possible Side Effects of Shingrix? Studies show that Shingrix is safe. The vaccine helps your body create a strong defense against shingles. As a result, you are likely to have temporary side effects from getting the shots. The side effects may affect your ability to do normal daily activities for 2 to 3 days. Most people got a sore arm with mild or moderate pain after getting Shingrix, and some also had redness and swelling where they got the shot. Some people felt tired, had muscle pain, a headache, shivering, fever, stomach pain, or nausea. About 1 out of 6 people who got Shingrix experienced side effects that prevented them from doing regular activities. Symptoms went away on their own in about 2 to 3 days. Side effects were more common in younger people. You might have a reaction to the first or second dose of Shingrix, or both doses. If you experience side effects, you may choose to take over-the-counter pain medicine such as ibuprofen or acetaminophen. If you experience side effects from Shingrix, you should report them to the Vaccine Adverse Event Reporting System (VAERS). Your doctor might file this report, or you can do it yourself through the VAERS websiteExternal, or by calling [phone]. If you have any questions  about side effects from Shingrix, talk with your doctor. The shingles vaccine does not contain thimerosal (a preservative containing mercury). Top of Page  When Should I See a Doctor Because of the Side Effects I Experience From Shingrix? In clinical trials, Shingrix was not associated with serious adverse events. In fact, serious side effects from vaccines are extremely rare. For example, for every 1 million doses of a vaccine given, only one or two people may have a severe allergic reaction. Signs of an allergic reaction happen within minutes or hours after vaccination and include hives, swelling of the face and throat, difficulty breathing, a fast heartbeat, dizziness, or  weakness. If you experience these or any other life-threatening symptoms, see a doctor right away. Shingrix causes a strong response in your immune system, so it may produce short-term side effects more intense than you are used to from other vaccines. These side effects can be uncomfortable, but they are expected and usually go away on their own in 2 or 3 days. Top of Page  How Can I Pay For Shingrix? There are several ways shingles vaccine may be paid for: Medicare . Medicare Part D plans cover the shingles vaccine, but there may be a cost to you depending on your plan. There may be a copay for the vaccine, or you may need to pay in full then get reimbursed for a certain amount.  . Medicare Part B does not cover the shingles vaccine. Medicaid . Medicaid may or may not cover the vaccine. Contact your insurer to find out. Private health insurance . Many private health insurance plans will cover the vaccine, but there may be a cost to you depending on your plan. Contact your insurer to find out. Vaccine assistance programs . Some pharmaceutical companies provide vaccines to eligible adults who cannot afford them. You may want to check with the vaccine manufacturer, [employer], about Shingrix. If you do not currently have health insurance, learn more about affordable health coverage optionsExternal. To find doctor's offices or pharmacies near you that offer the vaccine, visit HealthMap Vaccine FinderExternal. Here is some information about the vaccines. The data is very good and this information hopefully answers a lot of your questions and give you a confidence boost.   The Pfizer and Moderna vaccines are messenger RNA vaccines. That technology is not new, it has been studied for 20 years at least, used for cancer and MS treatment. They had started using the vaccine for MERS AND SARS (both a different coronavirus) 10 years ago but never finished so we had a good backbone for this vaccine.    There were no short cuts with the techniques for these clinical  trials, just lots of willing participants quickly and lots of up front money helped speed up the process.   The mRNA is very fragile which is why it needs to be kept so cold and thawed a certain way, think of it as a message in a glass bottle. NO PART of the virus is in this vaccine, it is a clip of the genetic sequence. This mRNA is injected in your arm, connects with a ribosome, delivers the message and the degrades.  That is part of the cause of the sore arm, the mRNA never leaves your arm. It degrades there. The mRNA does not go into our nucleotide where our DNA is, and we would need a DNA reverse transcriptase to take RNA to DNA, we do not have this, it can not change our DNA. The  ribosome that got the message creates a protein and that protein circulates in our body and we have an immune reaction to that creating antibodies. Any time our immune system is triggered, inflammation is triggered too so you can have a temp, muscle aches, etc. Normal reaction.  I have seen so many patients that just had mild COVID in the office weeks later still have issues. We are still learning about post COVID syndrome, the CDC should be coming out for guidelines for practioners soon. There is too much unknown about COVID. We have been using vaccines for over 100 years or more, i Personnel officer. Ask your parents or any older friends about polio and that vaccine, that was a disease shutting down schools,had kids in iron lung, devastating young kids and families. People lined up for that vaccine and technology has only improved.   Please get the vaccine. If you have any further questions please make an appointment in the office to discuss further.  Estill Bamberg

## 2019-12-20 LAB — COMPLETE METABOLIC PANEL WITH GFR
AG Ratio: 2.6 (calc) — ABNORMAL HIGH (ref 1.0–2.5)
ALT: 19 U/L (ref 6–29)
AST: 19 U/L (ref 10–35)
Albumin: 4.5 g/dL (ref 3.6–5.1)
Alkaline phosphatase (APISO): 75 U/L (ref 37–153)
BUN: 21 mg/dL (ref 7–25)
CO2: 30 mmol/L (ref 20–32)
Calcium: 10 mg/dL (ref 8.6–10.4)
Chloride: 106 mmol/L (ref 98–110)
Creat: 0.77 mg/dL (ref 0.50–0.99)
GFR, Est African American: 97 mL/min/{1.73_m2} (ref >=60)
GFR, Est Non African American: 84 mL/min/{1.73_m2} (ref >=60)
Globulin: 1.7 g/dL (calc) — ABNORMAL LOW (ref 1.9–3.7)
Glucose, Bld: 103 mg/dL — ABNORMAL HIGH (ref 65–99)
Potassium: 4.2 mmol/L (ref 3.5–5.3)
Sodium: 143 mmol/L (ref 135–146)
Total Bilirubin: 0.4 mg/dL (ref 0.2–1.2)
Total Protein: 6.2 g/dL (ref 6.1–8.1)

## 2019-12-20 LAB — URINALYSIS, ROUTINE W REFLEX MICROSCOPIC
Bilirubin Urine: NEGATIVE
Glucose, UA: NEGATIVE
Hgb urine dipstick: NEGATIVE
Ketones, ur: NEGATIVE
Leukocytes,Ua: NEGATIVE
Nitrite: NEGATIVE
Protein, ur: NEGATIVE
Specific Gravity, Urine: 1.006 (ref 1.001–1.03)
pH: 8 (ref 5.0–8.0)

## 2019-12-20 LAB — MICROALBUMIN / CREATININE URINE RATIO
Creatinine, Urine: 18 mg/dL — ABNORMAL LOW (ref 20–275)
Microalb Creat Ratio: 11 mcg/mg creat (ref <30)
Microalb, Ur: 0.2 mg/dL

## 2019-12-20 LAB — IRON, TOTAL/TOTAL IRON BINDING CAP
%SAT: 23 % (calc) (ref 16–45)
Iron: 78 ug/dL (ref 45–160)
TIBC: 335 mcg/dL (calc) (ref 250–450)

## 2019-12-20 LAB — LIPID PANEL
Cholesterol: 159 mg/dL (ref <200)
HDL: 51 mg/dL (ref >=50)
LDL Cholesterol (Calc): 85 mg/dL (calc)
Non-HDL Cholesterol (Calc): 108 mg/dL (calc) (ref <130)
Total CHOL/HDL Ratio: 3.1 (calc) (ref <5.0)
Triglycerides: 130 mg/dL (ref <150)

## 2019-12-20 LAB — CBC WITH DIFFERENTIAL/PLATELET
Absolute Monocytes: 431 cells/uL (ref 200–950)
Basophils Absolute: 89 cells/uL (ref 0–200)
Basophils Relative: 1.5 %
Eosinophils Absolute: 142 cells/uL (ref 15–500)
Eosinophils Relative: 2.4 %
HCT: 40.1 % (ref 35.0–45.0)
Hemoglobin: 13.7 g/dL (ref 11.7–15.5)
Lymphs Abs: 1540 cells/uL (ref 850–3900)
MCH: 30.9 pg (ref 27.0–33.0)
MCHC: 34.2 g/dL (ref 32.0–36.0)
MCV: 90.3 fL (ref 80.0–100.0)
MPV: 11.1 fL (ref 7.5–12.5)
Monocytes Relative: 7.3 %
Neutro Abs: 3699 cells/uL (ref 1500–7800)
Neutrophils Relative %: 62.7 %
Platelets: 230 10*3/uL (ref 140–400)
RBC: 4.44 10*6/uL (ref 3.80–5.10)
RDW: 12.6 % (ref 11.0–15.0)
Total Lymphocyte: 26.1 %
WBC: 5.9 10*3/uL (ref 3.8–10.8)

## 2019-12-20 LAB — HEMOGLOBIN A1C
Hgb A1c MFr Bld: 4.9 % of total Hgb (ref <5.7)
Mean Plasma Glucose: 94 (calc)
eAG (mmol/L): 5.2 (calc)

## 2019-12-20 LAB — TSH: TSH: 2.9 mIU/L (ref 0.40–4.50)

## 2019-12-20 LAB — MAGNESIUM: Magnesium: 2.4 mg/dL (ref 1.5–2.5)

## 2019-12-20 LAB — VITAMIN D 25 HYDROXY (VIT D DEFICIENCY, FRACTURES): Vit D, 25-Hydroxy: 79 ng/mL (ref 30–100)

## 2019-12-20 LAB — VITAMIN B12: Vitamin B-12: 410 pg/mL (ref 200–1100)

## 2019-12-22 ENCOUNTER — Other Ambulatory Visit: Payer: Self-pay | Admitting: Internal Medicine

## 2019-12-31 ENCOUNTER — Other Ambulatory Visit: Payer: Self-pay | Admitting: Internal Medicine

## 2020-01-14 DIAGNOSIS — R05 Cough: Secondary | ICD-10-CM | POA: Diagnosis not present

## 2020-01-14 DIAGNOSIS — U071 COVID-19: Secondary | ICD-10-CM | POA: Diagnosis not present

## 2020-02-23 ENCOUNTER — Other Ambulatory Visit: Payer: Self-pay | Admitting: Physician Assistant

## 2020-02-23 DIAGNOSIS — M79644 Pain in right finger(s): Secondary | ICD-10-CM

## 2020-02-25 ENCOUNTER — Telehealth: Payer: Self-pay | Admitting: Physician Assistant

## 2020-02-25 NOTE — Telephone Encounter (Signed)
called patient and advised office visit to evaluate bilateral hand pain for rheumatilogy referral or referral to hand specialist is recommended. Patient is  agreeable to office visit for evaluation. Requests advise on what she can take for severe "arthritis"  bilateral hand pain until upcoming 03/22/20 appointment. Has tried various cooper gloves, creams, and had an xray on one hand.  She tried Meloxicam, from previous back injury, this takes away pain in hands when she has tried it recently.  Has tried Tylenol - it does not touch the pain.  She does  take supplements and a OTC hand cream.   If advisable, can she take the Meloxicam,  or would you call in rx for pain until office visit, or recommend a sooner visit. Please advise.  [place]

## 2020-02-26 ENCOUNTER — Encounter: Payer: Self-pay | Admitting: Internal Medicine

## 2020-02-27 NOTE — Progress Notes (Signed)
Assessment and Plan:  Vance was seen today for hand pain.  Diagnoses and all orders for this visit:  Bilateral thumb pain Bil thumb pain, R suggestive of arthritis, L possible tendonitis element Patient has hx of NSAID gastritis but otherwise has exhausted conservative therapy She DID have negative autoimmune workup just last year, negative family history and based on HPI and presentation my suspicion for autoimmune etiology is low SHE IS SCHEDULED TO SEE HAND SPECIALIST DR. Fredna Dow I reviewed with her this is likely the best specialist to see for her concerns, may be able to do local steroid injection, focused [place] etc to better diagnose, may do aspiration of joint If any signs of autoimmune etiology he can further recommend referral She is reassured by our discussion today, in agreement on holding off on rheum referral at this time.  Follow up if needed but otherwise defer to Dr. Fredna Dow  Further disposition pending results of labs. Discussed med's effects and SE's.   Over 15 minutes of exam, counseling, chart review, and critical decision making was performed.   Future Appointments  Date Time Provider [place]  03/22/2020  8:45 AM Vicie Mutters, PA-C [facility] None  12/21/2020  3:00 PM Unk Pinto, MD [facility] None    ------------------------------------------------------------------------------------------------------------------   HPI BP (!) 140/88    Pulse 86    Temp (!) 95.7 F (35.4 C)    Wt 193 lb 9.6 oz (87.8 kg)    SpO2 97%    BMI 33.23 kg/m   59 y.o.female R handed, works at job with lots of typing, presents for evaluation of bilateral thumb pain, patient requesting rheumatology referral.   She reports began having some thumb pain last year; mainly on R in MCP, then fell in October, not [place] but went to [facility] and saw PA, [employer], no fracture on xrays but felt some arthritis. She reports in recent months also started having pain along dorsum of L  thumb, worst at [facility] with some generalized swelling. She reports bil thumb thumb 5-6/10, non-radicular. She has noted weaker grip, struggling to open jars. She takes meloxicam/diclofenac sparingly for severe pain only due to hx of NSAID gastritis, also has started taking numerous supplements (tart cherry, tumeric/bioprene, ginger, chondroitin/glucosamine), also copper glove, voltaren, tylenol in recent weeks with persistent sx. Her friend has psoriatic arthritis and suggested she may need to see rheumatologist to presents today. She also shares today that she is scheduled to see hand specialist Dr. Daryll Brod this Wednesday.   On review, note she had autoimmune workup last year, she had negative Anti DNA, RF, Anti Smtih, Anti CCP on 03/19/2019, did have mild CRP elevation at 18 and completed decadron taper. She does not have family history of autoimmune conditions. She denies significant fatigue. Does have stiffness but throughout the day.    Lab Results  Component Value Date   CRP 18.0 (H) 03/19/2019   Lab Results  Component Value Date   GFRNONAA 84 12/19/2019    Family History:  Herfamily history includes COPD in her father; Cancer in her father; Heart disease in her father; Hyperlipidemia in her father; Hypertension in her father and mother; Stroke in her father.  Past Medical History:  Diagnosis Date   Allergy    Asthma    Hyperlipidemia    Hypertension    Migraine    NSAID induced gastritis 03/01/2020   Obesity    Prediabetes    Vitamin D deficiency      Allergies:  Allergies  Allergen Reactions   Ace Inhibitors     cough   Azithromycin     rash   Prednisone     High dose   Vicodin [Hydrocodone-Acetaminophen]     Nausea/vomiting     Allergies  Allergen Reactions   Ace Inhibitors     cough   Azithromycin     rash   Prednisone     High dose   Vicodin [Hydrocodone-Acetaminophen]     Nausea/vomiting    Current Outpatient Medications on File Prior  to Visit  Medication Sig   albuterol (PROVENTIL HFA;VENTOLIN HFA) 108 (90 BASE) MCG/ACT inhaler Inhale into the lungs every 6 (six) hours as needed for wheezing or shortness of breath.   aspirin 81 MG tablet Take 81 mg by mouth daily.   budesonide-formoterol (SYMBICORT) 160-4.5 MCG/ACT inhaler Inhale 2 puffs into the lungs 2 (two) times daily.   Cholecalciferol (VITAMIN D PO) Take 4,000 Units by mouth daily.    Ginger, Zingiber officinalis, (GINGER ROOT PO) Take by mouth daily.   Glucosamine-Chondroit-Vit C-Mn (GLUCOSAMINE-CHONDROITIN MAX ST PO) Take by mouth daily.   levocetirizine (XYZAL) 5 MG tablet    losartan (COZAAR) 100 MG tablet TAKE 1 TABLET BY MOUTH ONCE DAILY FOR BLOOD PRESSURE   meloxicam (MOBIC) 15 MG tablet Take 1/2 to 1 tablet Daily with Food for Pain & Inflammation & try limit to 5 days /week to Avoid Kidney Damage (Patient taking differently: as needed. Take 1/2 to 1 tablet Daily with Food for Pain & Inflammation & try limit to 5 days /week to Avoid Kidney Damage)   Misc Natural Products (TART CHERRY ADVANCED PO) Take by mouth daily.   Misc Natural Products (TURMERIC CURCUMIN) CAPS Take by mouth daily.   montelukast (SINGULAIR) 10 MG tablet Take 10 mg by mouth at bedtime.   OVER THE COUNTER MEDICATION Bone strenght vitamin with Calcium and Magnesium   OVER THE COUNTER MEDICATION daily. Takes magnical-d   OVER THE COUNTER MEDICATION Takes Vinali   pantoprazole (PROTONIX) 40 MG tablet TAKE 1 TABLET ONCE DAILY   pravastatin (PRAVACHOL) 40 MG tablet TAKE ONE TABLET BY MOUTH AT BEDTIME FOR CHOLESTEROL   triamcinolone ointment (KENALOG) 0.1 % Apply 1 application topically 2 (two) times daily. (Patient taking differently: Apply 1 application topically as needed. )   cyclobenzaprine (FLEXERIL) 5 MG tablet Take 1/2 to 1 tablet 2 to 3 x /day if needed for Muscle Spasms (Patient not taking: Reported on 03/01/2020)   Multiple Vitamins-Minerals (MULTIVITAMIN PO) Take by  mouth daily. (Patient not taking: Reported on 03/01/2020)   terbinafine (LAMISIL) 250 MG tablet Take 1 tablet (250 mg total) by mouth daily. Take one daily for 3 months, need liver function lab at 6 weeks. (Patient not taking: Reported on 03/01/2020)   No current facility-administered medications on file prior to visit.    ROS: all negative except above.   Physical Exam:  BP (!) 140/88    Pulse 86    Temp (!) 95.7 F (35.4 C)    Wt 193 lb 9.6 oz (87.8 kg)    SpO2 97%    BMI 33.23 kg/m   General Appearance: Well nourished, obese female in no apparent distress. Eyes: PERRLA, conjunctiva no swelling or erythema ENT/Mouth: Mask in place; Hearing normal.  Neck: Supple Respiratory: Respiratory effort normal, BS equal bilaterally without rales, rhonchi, wheezing or stridor.  Cardio: RRR with no MRGs. Brisk peripheral pulses without edema.  Lymphatics: Non tender without lymphadenopathy.  Musculoskeletal: Bil hands with bony deformity  of DIP joints without effusion, heat; PIP joints and MCP joints appear mostly intact. R thumb with mild contracture limiting ROM, some tenderness at MCP joint without effusion or obvious bony deformity. L thumb with mild erythema, tenderness and heat to [facility] joint, neg De Quervain's. Otherwise intact, symmetrical, no obvious deformity. normal gait.  Skin: Warm, dry without rashes, lesions, ecchymosis.  Neuro: Normal muscle tone, Sensation intact.  Psych: Awake and oriented X 3, normal affect, Insight and Judgment appropriate.     [name], NP 5:27 PM [facility]

## 2020-03-01 ENCOUNTER — Encounter: Payer: Self-pay | Admitting: Adult Health

## 2020-03-01 ENCOUNTER — Ambulatory Visit: Payer: BC Managed Care – PPO | Admitting: Adult Health

## 2020-03-01 ENCOUNTER — Other Ambulatory Visit: Payer: Self-pay

## 2020-03-01 VITALS — BP 140/88 | HR 86 | Temp 95.7°F | Wt 193.6 lb

## 2020-03-01 DIAGNOSIS — M79644 Pain in right finger(s): Secondary | ICD-10-CM

## 2020-03-01 DIAGNOSIS — K2960 Other gastritis without bleeding: Secondary | ICD-10-CM | POA: Diagnosis not present

## 2020-03-01 DIAGNOSIS — M79645 Pain in left finger(s): Secondary | ICD-10-CM

## 2020-03-01 DIAGNOSIS — Z8616 Personal history of COVID-19: Secondary | ICD-10-CM | POA: Insufficient documentation

## 2020-03-01 DIAGNOSIS — M19041 Primary osteoarthritis, right hand: Secondary | ICD-10-CM | POA: Insufficient documentation

## 2020-03-01 DIAGNOSIS — T39395A Adverse effect of other nonsteroidal anti-inflammatory drugs [NSAID], initial encounter: Secondary | ICD-10-CM

## 2020-03-01 HISTORY — DX: Other gastritis without bleeding: K29.60

## 2020-03-01 HISTORY — DX: Other gastritis without bleeding: T39.395A

## 2020-03-01 HISTORY — DX: Personal history of COVID-19: Z86.16

## 2020-03-01 NOTE — Patient Instructions (Addendum)
    Meloxicam ok in the short term - always take after stomach acid medication with food or a glass of milk on stomach   Make sure tumeric is with black pepper fruit or bioprene   Cellulose is fine to eat       Hand Pain Many things can cause hand pain. Some common causes are:  An injury.  Repeating the same movement with your hand over and over (overuse).  Osteoporosis.  Arthritis.  Lumps in the tendons or joints of the hand and wrist (ganglion cysts).  Nerve compression syndromes (carpal tunnel syndrome).  Inflammation of the tendons (tendinitis).  Infection. Follow these instructions at home: Pay attention to any changes in your symptoms. Take these actions to help with your discomfort: Managing pain, stiffness, and swelling   Take over-the-counter and prescription medicines only as told by your health care provider.  Wear a hand splint or support as told by your health care provider.  If directed, put ice on the affected area: ? Put ice in a plastic bag. ? Place a towel between your skin and the bag. ? Leave the ice on for 20 minutes, 2-3 times a day. Activity  Take breaks from repetitive activity often.  Avoid activities that make your pain worse.  Minimize stress on your hands and wrists as much as possible.  Do stretches or exercises as told by your health care provider.  Do not do activities that make your pain worse. Contact a health care provider if:  Your pain does not get better after a few days of self-care.  Your pain gets worse.  Your pain affects your ability to do your daily activities. Get help right away if:  Your hand becomes warm, red, or swollen.  Your hand is numb or tingling.  Your hand is extremely swollen or deformed.  Your hand or fingers turn white or blue.  You cannot move your hand, wrist, or fingers. Summary  Many things can cause hand pain.  Contact your health care provider if your pain does not get better  after a few days of self care.  Minimize stress on your hands and wrists as much as possible.  Do not do activities that make your pain worse. This information is not intended to replace advice given to you by your health care provider. Make sure you discuss any questions you have with your health care provider. Document Revised: 04/19/2018 Document Reviewed: 04/19/2018 Elsevier Patient Education  [place].

## 2020-03-03 DIAGNOSIS — M19042 Primary osteoarthritis, left hand: Secondary | ICD-10-CM | POA: Diagnosis not present

## 2020-03-03 DIAGNOSIS — M1811 Unilateral primary osteoarthritis of first carpometacarpal joint, right hand: Secondary | ICD-10-CM | POA: Diagnosis not present

## 2020-03-03 DIAGNOSIS — M1812 Unilateral primary osteoarthritis of first carpometacarpal joint, left hand: Secondary | ICD-10-CM | POA: Diagnosis not present

## 2020-03-03 DIAGNOSIS — M79644 Pain in right finger(s): Secondary | ICD-10-CM | POA: Diagnosis not present

## 2020-03-03 DIAGNOSIS — M19041 Primary osteoarthritis, right hand: Secondary | ICD-10-CM | POA: Diagnosis not present

## 2020-03-03 DIAGNOSIS — M180 Bilateral primary osteoarthritis of first carpometacarpal joints: Secondary | ICD-10-CM | POA: Diagnosis not present

## 2020-03-21 NOTE — Progress Notes (Signed)
FOLLOW UP  Assessment and Plan:   Asthma Well controlled on current regimen Continue meds, avoid triggers  Hypertension ADD BACK ON ZIAC 5 MG 1/2 PILL DAILY AND MONITOR BP AT HOME Monitor blood pressure at home; patient to call if consistently greater than 130/80 Continue DASH diet.   Reminder to go to the ER if any CP, SOB, nausea, dizziness, severe HA, changes vision/speech, left arm numbness and tingling and jaw pain.  Cholesterol continue statin therapy; working on lifestyle Continue low cholesterol diet and exercise.  Check lipid panel.   Other abnormal glucose Recent A1Cs at goal Discussed diet/exercise, weight management  Defer A1C; check CMP  Obesity with co morbidities Long discussion about weight loss, diet, and exercise Recommended diet heavy in fruits and veggies and low in animal meats, cheeses, and dairy products, appropriate calorie intake Patient will work on reducing stress eating, portion control Will follow up in 3 months  Vitamin D Def  At goal at last visit; continue supplementation to maintain goal of 70-100  GERD Well managed on current medications STOP TUMERIC- CONTINUE CELEBREX BUT MONITOR FOR SYMPTOMS Discussed diet, avoiding triggers and other lifestyle changes   Continue diet and meds as discussed. Further disposition pending results of labs. Discussed med's effects and SE's.   Over 30 minutes of exam, counseling, chart review, and critical decision making was performed.   Future Appointments  Date Time Provider [place]  12/21/2020  3:00 PM Unk Pinto, MD [facility] None    ----------------------------------------------------------------------------------------------------------------------  HPI 59 y.o. female  presents for 3 month follow up on hypertension, cholesterol, glucose management, obesity and vitamin D deficiency.   She has asthma currently well controlled on Symbicort and Singulair. She uses albuterol  rarely.  she has a diagnosis of NSAID gastritis (per 2017 EGD) which is currently managed by protonix 40 mg daily . She has bilateral thumb OA and is seeing Dr. Fredna Dow, she is on celebrex at this time. Her pain is better, BP is slightly elevated, not checking at home, has wrist cuff. Rechecked BP at 146/90.  She is walking/doing weights.  She denies chest pain, shortness of breath, dizziness. BP Readings from Last 3 Encounters:  03/22/20 (!) 136/100  03/01/20 (!) 140/88  12/19/19 128/76    BMI is Body mass index is 32.79 kg/m., she has been working on diet and exercise, but reports stressful few months and feels she has been stress eating. She is getting back on the next 56 days diet, goal to get back down to 145 lb.  Wt Readings from Last 3 Encounters:  03/22/20 191 lb (86.6 kg)  03/01/20 193 lb 9.6 oz (87.8 kg)  12/19/19 192 lb 3.2 oz (87.2 kg)    She is on cholesterol medication (pravastatin 40 mg daily) and denies myalgias. Her cholesterol is not at goal. The cholesterol last visit was:   Lab Results  Component Value Date   CHOL 159 12/19/2019   HDL 51 12/19/2019   LDLCALC 85 12/19/2019   TRIG 130 12/19/2019   CHOLHDL 3.1 12/19/2019    She has been working on diet and exercise for glucose management, and denies foot ulcerations, increased appetite, nausea, paresthesia of the feet, polydipsia, polyuria, visual disturbances, vomiting and weight loss. Last A1C in the office was:  Lab Results  Component Value Date   HGBA1C 4.9 12/19/2019    Lab Results  Component Value Date   GFRNONAA 84 12/19/2019   Patient is on Vitamin D supplement and at goal at recent  check:    Lab Results  Component Value Date   VD25OH 79 12/19/2019     Lab Results  Component Value Date   IRON 78 12/19/2019   TIBC 335 12/19/2019   Lab Results  Component Value Date   VITAMINB12 410 12/19/2019    Current Medications:  Current Outpatient Medications on File Prior to Visit  Medication Sig  .  albuterol (PROVENTIL HFA;VENTOLIN HFA) 108 (90 BASE) MCG/ACT inhaler Inhale into the lungs every 6 (six) hours as needed for wheezing or shortness of breath.  Marland Kitchen aspirin 81 MG tablet Take 81 mg by mouth daily.  . budesonide-formoterol (SYMBICORT) 160-4.5 MCG/ACT inhaler Inhale 2 puffs into the lungs 2 (two) times daily.  . Cholecalciferol (VITAMIN D PO) Take 4,000 Units by mouth daily.   . cyclobenzaprine (FLEXERIL) 5 MG tablet Take 1/2 to 1 tablet 2 to 3 x /day if needed for Muscle Spasms  . Ginger, Zingiber officinalis, (GINGER ROOT PO) Take by mouth daily.  . Glucosamine-Chondroit-Vit C-Mn (GLUCOSAMINE-CHONDROITIN MAX ST PO) Take by mouth daily.  Marland Kitchen levocetirizine (XYZAL) 5 MG tablet   . losartan (COZAAR) 100 MG tablet TAKE 1 TABLET BY MOUTH ONCE DAILY FOR BLOOD PRESSURE  . meloxicam (MOBIC) 15 MG tablet Take 1/2 to 1 tablet Daily with Food for Pain & Inflammation & try limit to 5 days /week to Avoid Kidney Damage (Patient taking differently: as needed. Take 1/2 to 1 tablet Daily with Food for Pain & Inflammation & try limit to 5 days /week to Avoid Kidney Damage)  . Misc Natural Products (TART CHERRY ADVANCED PO) Take by mouth daily.  . Misc Natural Products (TURMERIC CURCUMIN) CAPS Take by mouth daily.  . montelukast (SINGULAIR) 10 MG tablet Take 10 mg by mouth at bedtime.  . Multiple Vitamins-Minerals (MULTIVITAMIN PO) Take by mouth daily.   Marland Kitchen OVER THE COUNTER MEDICATION Bone strenght vitamin with Calcium and Magnesium  . OVER THE COUNTER MEDICATION daily. Takes magnical-d  . OVER THE COUNTER MEDICATION Takes Vinali  . pantoprazole (PROTONIX) 40 MG tablet TAKE 1 TABLET ONCE DAILY  . pravastatin (PRAVACHOL) 40 MG tablet TAKE ONE TABLET BY MOUTH AT BEDTIME FOR CHOLESTEROL  . triamcinolone ointment (KENALOG) 0.1 % Apply 1 application topically 2 (two) times daily. (Patient taking differently: Apply 1 application topically as needed. )   No current facility-administered medications on file prior  to visit.     Allergies:  Allergies  Allergen Reactions  . Ace Inhibitors     cough  . Azithromycin     rash  . Prednisone     High dose  . Vicodin [Hydrocodone-Acetaminophen]     Nausea/vomiting     Medical History:  Past Medical History:  Diagnosis Date  . Allergy   . Asthma   . Hyperlipidemia   . Hypertension   . Migraine   . NSAID induced gastritis 03/01/2020  . Obesity   . Prediabetes   . Vitamin D deficiency    Family history- Reviewed and unchanged Social history- Reviewed and unchanged   Review of Systems:  Review of Systems  Constitutional: Negative for malaise/fatigue and weight loss.  HENT: Negative for hearing loss and tinnitus.   Eyes: Negative for blurred vision and double vision.  Respiratory: Negative for cough, shortness of breath and wheezing.   Cardiovascular: Negative for chest pain, palpitations, orthopnea, claudication and leg swelling.  Gastrointestinal: Positive for diarrhea (loose stools). Negative for abdominal pain, blood in stool, constipation, heartburn, melena, nausea and vomiting.  Mild intermittent lower abdominal cramping  Genitourinary: Negative.   Musculoskeletal: Negative for falls, joint pain, myalgias and neck pain.  Skin: Negative for rash.  Neurological: Negative for dizziness, tingling, sensory change, weakness and headaches.  Endo/Heme/Allergies: Negative for polydipsia.  Psychiatric/Behavioral: Negative.   All other systems reviewed and are negative.   Physical Exam: BP (!) 136/100   Pulse 81   Temp 97.6 F (36.4 C)   Wt 191 lb (86.6 kg)   SpO2 98%   BMI 32.79 kg/m  Wt Readings from Last 3 Encounters:  03/22/20 191 lb (86.6 kg)  03/01/20 193 lb 9.6 oz (87.8 kg)  12/19/19 192 lb 3.2 oz (87.2 kg)   General Appearance: Well nourished, in no apparent distress. Eyes: PERRLA, EOMs, conjunctiva no swelling or erythema Sinuses: No Frontal/maxillary tenderness ENT/Mouth: Ext aud canals clear, TMs without  erythema, bulging. No erythema, swelling, or exudate on post pharynx.  Tonsils not swollen or erythematous. Hearing normal.  Neck: Supple, thyroid normal.  Respiratory: Respiratory effort normal, BS equal bilaterally without rales, rhonchi, wheezing or stridor.  Cardio: RRR with no MRGs. Brisk peripheral pulses without edema.  Abdomen: Soft, + BS.  Mildly tender throughout except center, no guarding, rebound, hernias, masses. Lymphatics: Non tender without lymphadenopathy.  Musculoskeletal: Full ROM without effusion, obvious deformity. Normal gait.   Skin: Warm, dry without rashes, lesions, ecchymosis.  Neuro: Cranial nerves intact. No cerebellar symptoms.  Psych: Awake and oriented X 3, normal affect, Insight and Judgment appropriate.    Vicie Mutters, PA-C 9:03 AM [facility]

## 2020-03-22 ENCOUNTER — Other Ambulatory Visit: Payer: Self-pay

## 2020-03-22 ENCOUNTER — Ambulatory Visit: Payer: BC Managed Care – PPO | Admitting: Physician Assistant

## 2020-03-22 ENCOUNTER — Encounter: Payer: Self-pay | Admitting: Physician Assistant

## 2020-03-22 VITALS — BP 136/100 | HR 81 | Temp 97.6°F | Wt 191.0 lb

## 2020-03-22 DIAGNOSIS — R7309 Other abnormal glucose: Secondary | ICD-10-CM

## 2020-03-22 DIAGNOSIS — E669 Obesity, unspecified: Secondary | ICD-10-CM

## 2020-03-22 DIAGNOSIS — E559 Vitamin D deficiency, unspecified: Secondary | ICD-10-CM

## 2020-03-22 DIAGNOSIS — I10 Essential (primary) hypertension: Secondary | ICD-10-CM

## 2020-03-22 DIAGNOSIS — E782 Mixed hyperlipidemia: Secondary | ICD-10-CM

## 2020-03-22 DIAGNOSIS — D649 Anemia, unspecified: Secondary | ICD-10-CM | POA: Diagnosis not present

## 2020-03-22 DIAGNOSIS — Z79899 Other long term (current) drug therapy: Secondary | ICD-10-CM | POA: Diagnosis not present

## 2020-03-22 LAB — CBC WITH DIFFERENTIAL/PLATELET
Absolute Monocytes: 348 cells/uL (ref 200–950)
Basophils Absolute: 78 cells/uL (ref 0–200)
Basophils Relative: 1.3 %
Eosinophils Absolute: 132 cells/uL (ref 15–500)
Eosinophils Relative: 2.2 %
HCT: 39.4 % (ref 35.0–45.0)
Hemoglobin: 13.1 g/dL (ref 11.7–15.5)
Lymphs Abs: 1398 cells/uL (ref 850–3900)
MCH: 30.8 pg (ref 27.0–33.0)
MCHC: 33.2 g/dL (ref 32.0–36.0)
MCV: 92.5 fL (ref 80.0–100.0)
MPV: 10.7 fL (ref 7.5–12.5)
Monocytes Relative: 5.8 %
Neutro Abs: 4044 cells/uL (ref 1500–7800)
Neutrophils Relative %: 67.4 %
Platelets: 230 10*3/uL (ref 140–400)
RBC: 4.26 10*6/uL (ref 3.80–5.10)
RDW: 12.3 % (ref 11.0–15.0)
Total Lymphocyte: 23.3 %
WBC: 6 10*3/uL (ref 3.8–10.8)

## 2020-03-22 LAB — COMPLETE METABOLIC PANEL WITH GFR
AG Ratio: 2.2 (calc) (ref 1.0–2.5)
ALT: 22 U/L (ref 6–29)
AST: 20 U/L (ref 10–35)
Albumin: 4.4 g/dL (ref 3.6–5.1)
Alkaline phosphatase (APISO): 85 U/L (ref 37–153)
BUN: 16 mg/dL (ref 7–25)
CO2: 30 mmol/L (ref 20–32)
Calcium: 10 mg/dL (ref 8.6–10.4)
Chloride: 106 mmol/L (ref 98–110)
Creat: 0.77 mg/dL (ref 0.50–0.99)
GFR, Est African American: 97 mL/min/{1.73_m2} (ref >=60)
GFR, Est Non African American: 84 mL/min/{1.73_m2} (ref >=60)
Globulin: 2 g/dL (calc) (ref 1.9–3.7)
Glucose, Bld: 98 mg/dL (ref 65–99)
Potassium: 4.6 mmol/L (ref 3.5–5.3)
Sodium: 142 mmol/L (ref 135–146)
Total Bilirubin: 0.4 mg/dL (ref 0.2–1.2)
Total Protein: 6.4 g/dL (ref 6.1–8.1)

## 2020-03-22 LAB — VITAMIN B12: Vitamin B-12: 1444 pg/mL — ABNORMAL HIGH (ref 200–1100)

## 2020-03-22 LAB — LIPID PANEL
Cholesterol: 170 mg/dL (ref <200)
HDL: 51 mg/dL (ref >=50)
LDL Cholesterol (Calc): 96 mg/dL (calc)
Non-HDL Cholesterol (Calc): 119 mg/dL (calc) (ref <130)
Total CHOL/HDL Ratio: 3.3 (calc) (ref <5.0)
Triglycerides: 132 mg/dL (ref <150)

## 2020-03-22 LAB — IRON, TOTAL/TOTAL IRON BINDING CAP
%SAT: 24 % (calc) (ref 16–45)
Iron: 85 ug/dL (ref 45–160)
TIBC: 350 mcg/dL (calc) (ref 250–450)

## 2020-03-22 LAB — VITAMIN D 25 HYDROXY (VIT D DEFICIENCY, FRACTURES): Vit D, 25-Hydroxy: 73 ng/mL (ref 30–100)

## 2020-03-22 LAB — MAGNESIUM: Magnesium: 2.4 mg/dL (ref 1.5–2.5)

## 2020-03-22 LAB — TSH: TSH: 2.9 mIU/L (ref 0.40–4.50)

## 2020-03-22 MED ORDER — CELECOXIB 200 MG PO CAPS
200.0000 mg | ORAL_CAPSULE | Freq: Every day | ORAL | 2 refills | Status: DC
Start: 2020-03-22 — End: 2020-12-27

## 2020-03-22 MED ORDER — BISOPROLOL-HYDROCHLOROTHIAZIDE 5-6.25 MG PO TABS: ORAL_TABLET | ORAL | 0 refills | Status: DC

## 2020-03-22 NOTE — Patient Instructions (Addendum)
HYPERTENSION INFORMATION  Monitor your blood pressure at home, please keep a record and bring that in with you to your next office visit.   Can add back on the ziac 5 mg start 1/2- monitor BP  Stop the tumeric and the glucosamine  Go to the ER if any CP, SOB, nausea, dizziness, severe HA, changes vision/speech  Testing/Procedures: HOW TO TAKE YOUR BLOOD PRESSURE:  Rest 5 minutes before taking your blood pressure.  Don't smoke or drink caffeinated beverages for at least 30 minutes before.  Take your blood pressure before (not after) you eat.  Sit comfortably with your back supported and both feet on the floor (don't cross your legs).  Elevate your arm to heart level on a table or a desk.  Use the proper sized cuff. It should fit smoothly and snugly around your bare upper arm. There should be enough room to slip a fingertip under the cuff. The bottom edge of the cuff should be 1 inch above the crease of the elbow.  Due to a recent study, SPRINT, we have changed our goal for the systolic or top blood pressure number. Ideally we want your top number at 120.  In the [facility] Trial, 5000 people were randomized to a goal BP of 120 and 5000 people were randomized to a goal BP of less than 140. The patients with the goal BP at 120 had LESS DEMENTIA, LESS HEART ATTACKS, AND LESS STROKES, AS WELL AS OVERALL DECREASED MORTALITY OR DEATH RATE.   There was another study that showed taking your blood pressure medications at night decrease cardiovascular events.  However if you are on a fluid pill, please take this in the morning.   If you are willing, our goal BP is the top number of 120.  Your most recent BP: BP: (!) 136/100   Take your medications faithfully as instructed. Maintain a healthy weight. Get at least 150 minutes of aerobic exercise per week. Minimize salt intake. Minimize alcohol intake  DASH Eating Plan DASH stands for "Dietary Approaches to Stop Hypertension." The DASH eating  plan is a healthy eating plan that has been shown to reduce high blood pressure (hypertension). Additional health benefits may include reducing the risk of type 2 diabetes mellitus, heart disease, and stroke. The DASH eating plan may also help with weight loss. WHAT DO I NEED TO KNOW ABOUT THE DASH EATING PLAN? For the DASH eating plan, you will follow these general guidelines:  Choose foods with a percent daily value for sodium of less than 5% (as listed on the food label).  Use salt-free seasonings or herbs instead of table salt or sea salt.  Check with your health care provider or pharmacist before using salt substitutes.  Eat lower-sodium products, often labeled as "lower sodium" or "no salt added."  Eat fresh foods.  Eat more vegetables, fruits, and low-fat dairy products.  Choose whole grains. Look for the word "whole" as the first word in the ingredient list.  Choose fish and skinless chicken or [place] more often than red meat. Limit fish, poultry, and meat to 6 oz (170 g) each day.  Limit sweets, desserts, sugars, and sugary drinks.  Choose heart-healthy fats.  Limit cheese to 1 oz (28 g) per day.  Eat more home-cooked food and less restaurant, buffet, and fast food.  Limit fried foods.  Cook foods using methods other than frying.  Limit canned vegetables. If you do use them, rinse them well to decrease the sodium.  When  eating at a restaurant, ask that your food be prepared with less salt, or no salt if possible. WHAT FOODS CAN I EAT? Seek help from a dietitian for individual calorie needs. Grains Whole grain or whole wheat bread. Brown rice. Whole grain or whole wheat pasta. Quinoa, bulgur, and whole grain cereals. Low-sodium cereals. Corn or whole wheat flour tortillas. Whole grain cornbread. Whole grain crackers. Low-sodium crackers. Vegetables Fresh or frozen vegetables (raw, steamed, roasted, or grilled). Low-sodium or reduced-sodium tomato and vegetable juices.  Low-sodium or reduced-sodium tomato sauce and paste. Low-sodium or reduced-sodium canned vegetables.  Fruits All fresh, canned (in natural juice), or frozen fruits. Meat and Other Protein Products Ground beef (85% or leaner), grass-fed beef, or beef trimmed of fat. Skinless chicken or [place]. Ground chicken or [place]. Pork trimmed of fat. All fish and seafood. Eggs. Dried beans, peas, or lentils. Unsalted nuts and seeds. Unsalted canned beans. Dairy Low-fat dairy products, such as skim or 1% milk, 2% or reduced-fat cheeses, low-fat ricotta or cottage cheese, or plain low-fat yogurt. Low-sodium or reduced-sodium cheeses. Fats and Oils Tub margarines without trans fats. Light or reduced-fat mayonnaise and salad dressings (reduced sodium). Avocado. Safflower, olive, or canola oils. Natural peanut or almond butter. Other Unsalted popcorn and pretzels. The items listed above may not be a complete list of recommended foods or beverages. Contact your dietitian for more options. WHAT FOODS ARE NOT RECOMMENDED? Grains White bread. White pasta. White rice. Refined cornbread. Bagels and croissants. Crackers that contain trans fat. Vegetables Creamed or fried vegetables. Vegetables in a cheese sauce. Regular canned vegetables. Regular canned tomato sauce and paste. Regular tomato and vegetable juices. Fruits Dried fruits. Canned fruit in light or heavy syrup. Fruit juice. Meat and Other Protein Products Fatty cuts of meat. Ribs, chicken wings, bacon, sausage, bologna, salami, chitterlings, fatback, hot dogs, bratwurst, and packaged luncheon meats. Salted nuts and seeds. Canned beans with salt. Dairy Whole or 2% milk, cream, half-and-half, and cream cheese. Whole-fat or sweetened yogurt. Full-fat cheeses or blue cheese. Nondairy creamers and whipped toppings. Processed cheese, cheese spreads, or cheese curds. Condiments Onion and garlic salt, seasoned salt, table salt, and sea salt. Canned and packaged  gravies. Worcestershire sauce. Tartar sauce. Barbecue sauce. Teriyaki sauce. Soy sauce, including reduced sodium. Steak sauce. Fish sauce. Oyster sauce. Cocktail sauce. Horseradish. Ketchup and mustard. Meat flavorings and tenderizers. Bouillon cubes. Hot sauce. Tabasco sauce. Marinades. Taco seasonings. Relishes. Fats and Oils Butter, stick margarine, lard, shortening, ghee, and bacon fat. Coconut, palm kernel, or palm oils. Regular salad dressings. Other Pickles and olives. Salted popcorn and pretzels. The items listed above may not be a complete list of foods and beverages to avoid. Contact your dietitian for more information. WHERE CAN I FIND MORE INFORMATION? National Heart, Lung, and Blood Institute: travelstabloid.com Document Released: 07/13/2011 Document Revised: 12/08/2013 Document Reviewed: 05/28/2013 [facility] Patient Information 2015 [place], [place]. This information is not intended to replace advice given to you by your health care provider. Make sure you discuss any questions you have with your health care provider.   VENOUS INSUFFICIENCY Our lower leg venous system is not the most reliable, the heart does NOT pump fluid up, there is a valve system.  The muscles of the leg squeeze and the blood moves up and a valve opens and close, then they squeeze, blood moves up and valves open and closes keeping the blood moving towards the heart.  Lots can go wrong with this valve system.  If someone is sitting or  standing without movement, everyone will get swelling.  THINGS TO DO:  Do not stand or sit in one position for long periods of time. Do not sit with your legs crossed. Rest with your legs raised during the day.  Your legs have to be higher than your heart so that gravity will force the valves to open, so please really elevate your legs.   Wear elastic stockings or support hose. Do not wear other tight, encircling garments around the legs, pelvis,  or waist.  [employer]  has a wide variety of well priced compression stockings. [place], [place] [address] [id number] [place] has a good cheap selection, I like the socks, they are not as hard to get on  Walk as much as possible to increase blood flow.  Raise the foot of your bed at night with 2-inch blocks.  SEEK MEDICAL CARE IF:   The skin around your ankle starts to break down.  You have pain, redness, tenderness, or hard swelling developing in your leg over a vein.  You are uncomfortable due to leg pain.  If you ever have shortness of breath with exertion or chest pain go to the ER.   General eating tips  What to Avoid . Avoid added sugars o Often added sugar can be found in processed foods such as many condiments, dry cereals, cakes, cookies, chips, crisps, crackers, candies, sweetened drinks, etc.  o Read labels and AVOID/DECREASE use of foods with the following in their ingredient list: Sugar, fructose, high fructose corn syrup, sucrose, glucose, maltose, dextrose, molasses, cane sugar, brown sugar, any type of syrup, agave nectar, etc.   . Avoid snacking in between meals- drink water or if you feel you need a snack, pick a high water content snack such as cucumbers, watermelon, or any veggie.  Marland Kitchen Avoid foods made with flour o If you are going to eat food made with flour, choose those made with whole-grains; and, minimize your consumption as much as is tolerable . Avoid processed foods o These foods are generally stocked in the middle of the grocery store.  o Focus on shopping on the perimeter of the grocery.  What to Include . Vegetables o GREEN LEAFY VEGETABLES: Kale, spinach, mustard greens, collard greens, cabbage, broccoli, etc. o OTHER: Asparagus, cauliflower, eggplant, carrots, peas, Brussel sprouts, tomatoes, bell peppers, zucchini, beets, cucumbers, etc. . Grains, seeds, and legumes o Beans: kidney beans, black eyed peas, garbanzo beans,  black beans, pinto beans, etc. o Whole, unrefined grains: brown rice, barley, bulgur, oatmeal, etc. . Healthy fats  o Avoid highly processed fats such as vegetable oil o Examples of healthy fats: avocado, olives, virgin olive oil, dark chocolate (?72% Cocoa), nuts (peanuts, almonds, walnuts, cashews, pecans, etc.) o Please still do small amount of these healthy fats, they are dense in calories.  . Low - Moderate Intake of Animal Sources of Protein o Meat sources: chicken, [place], salmon, tuna. Limit to 4 ounces of meat at one time or the size of your palm. o Consider limiting dairy sources, but when choosing dairy focus on: PLAIN [place] yogurt, cottage cheese, high-protein milk . Fruit o Choose berries

## 2020-03-29 ENCOUNTER — Other Ambulatory Visit: Payer: Self-pay | Admitting: Physician Assistant

## 2020-04-08 ENCOUNTER — Other Ambulatory Visit: Payer: Self-pay | Admitting: Physician Assistant

## 2020-04-14 ENCOUNTER — Other Ambulatory Visit: Payer: Self-pay | Admitting: Internal Medicine

## 2020-04-14 DIAGNOSIS — M19042 Primary osteoarthritis, left hand: Secondary | ICD-10-CM | POA: Diagnosis not present

## 2020-04-14 DIAGNOSIS — Z1231 Encounter for screening mammogram for malignant neoplasm of breast: Secondary | ICD-10-CM

## 2020-04-14 DIAGNOSIS — M180 Bilateral primary osteoarthritis of first carpometacarpal joints: Secondary | ICD-10-CM | POA: Diagnosis not present

## 2020-04-14 DIAGNOSIS — M19041 Primary osteoarthritis, right hand: Secondary | ICD-10-CM | POA: Diagnosis not present

## 2020-05-17 ENCOUNTER — Other Ambulatory Visit: Payer: Self-pay

## 2020-05-17 ENCOUNTER — Ambulatory Visit
Admission: RE | Admit: 2020-05-17 | Discharge: 2020-05-17 | Disposition: A | Discharge status: Home / Self Care | Payer: BC Managed Care – PPO | Source: Ambulatory Visit | Attending: Internal Medicine | Admitting: Internal Medicine

## 2020-05-17 DIAGNOSIS — Z1231 Encounter for screening mammogram for malignant neoplasm of breast: Secondary | ICD-10-CM

## 2020-06-23 ENCOUNTER — Other Ambulatory Visit: Payer: Self-pay | Admitting: Internal Medicine

## 2020-06-23 DIAGNOSIS — K219 Gastro-esophageal reflux disease without esophagitis: Secondary | ICD-10-CM

## 2020-06-23 MED ORDER — PANTOPRAZOLE SODIUM 40 MG PO TBEC: DELAYED_RELEASE_TABLET | ORAL | 0 refills | Status: DC

## 2020-06-25 NOTE — Progress Notes (Signed)
FOLLOW UP  Assessment and Plan:   Asthma Well controlled on current regimen Continue meds, avoid triggers  Hypertension RESTART ZIAC - recheck NV in 2 weeks Discussed importance of not stopping BB abruptly Monitor blood pressure at home; patient to call if consistently greater than 130/80 Continue DASH diet.   Reminder to go to the ER if any CP, SOB, nausea, dizziness, severe HA, changes vision/speech, left arm numbness and tingling and jaw pain.  Cholesterol continue statin therapy; working on lifestyle Continue low cholesterol diet and exercise.  Check lipid panel.   Other abnormal glucose Recent A1Cs at goal Discussed diet/exercise, weight management  Defer A1C; check CMP  Obesity with co morbidities Long discussion about weight loss, diet, and exercise Recommended diet heavy in fruits and veggies and low in animal meats, cheeses, and dairy products, appropriate calorie intake Patient will work on reducing stress eating, portion control Will follow up in 3 months  Vitamin D Def  At goal at last visit; continue supplementation to maintain goal of 60-100  GERD Well managed on current medications CONTINUE CELEBREX BUT MONITOR FOR SYMPTOMS Discussed diet, avoiding triggers and other lifestyle changes   Continue diet and meds as discussed. Further disposition pending results of labs. Discussed med's effects and SE's.   Over 30 minutes of exam, counseling, chart review, and critical decision making was performed.   Future Appointments  Date Time Provider [place]  12/21/2020  3:00 PM Unk Pinto, MD [facility] None    ----------------------------------------------------------------------------------------------------------------------  HPI 59 y.o. female  presents for 3 month follow up on hypertension, cholesterol, glucose management, obesity and vitamin D deficiency.   She has asthma currently well controlled on Symbicort and Singulair. She uses  albuterol rarely.  she has a diagnosis of NSAID gastritis (per 2017 EGD) which is currently managed by protonix 40 mg daily. She has bilateral thumb OA and is seeing Dr. Fredna Dow, she is on celebrex at this time. Her pain is somewhat improved and monitoring for now, wants to avoid surgery. Also trying hemp topical.    She has been out of ziac for 2 weeks, admits not checking BPs at home. She denies chest pain, shortness of breath, dizziness. BP Readings from Last 3 Encounters:  06/28/20 (!) 158/94  03/22/20 (!) 136/100  03/01/20 (!) 140/88   BMI is Body mass index is 32.61 kg/m., she has been working on diet and exercise. She is getting back on the next 56 days diet, goal to get back down to 145 lb.  Wt Readings from Last 3 Encounters:  06/28/20 190 lb (86.2 kg)  03/22/20 191 lb (86.6 kg)  03/01/20 193 lb 9.6 oz (87.8 kg)    She is on cholesterol medication (pravastatin 40 mg daily) and denies myalgias. Her cholesterol is at goal. The cholesterol last visit was:   Lab Results  Component Value Date   CHOL 170 03/22/2020   HDL 51 03/22/2020   LDLCALC 96 03/22/2020   TRIG 132 03/22/2020   CHOLHDL 3.3 03/22/2020    She has been working on diet and exercise for glucose management, and denies foot ulcerations, increased appetite, nausea, paresthesia of the feet, polydipsia, polyuria, visual disturbances, vomiting and weight loss. Last A1C in the office was:  Lab Results  Component Value Date   HGBA1C 4.9 12/19/2019   Last GFR:  Lab Results  Component Value Date   GFRNONAA 84 03/22/2020   Patient is on Vitamin D supplement and at goal at recent check:    Lab  Results  Component Value Date   VD25OH 73 03/22/2020       Current Medications:  Current Outpatient Medications on File Prior to Visit  Medication Sig  . albuterol (PROVENTIL HFA;VENTOLIN HFA) 108 (90 BASE) MCG/ACT inhaler Inhale into the lungs every 6 (six) hours as needed for wheezing or shortness of breath.  Marland Kitchen aspirin 81  MG tablet Take 81 mg by mouth daily.  . budesonide-formoterol (SYMBICORT) 160-4.5 MCG/ACT inhaler Inhale 2 puffs into the lungs 2 (two) times daily.  . celecoxib (CELEBREX) 200 MG capsule Take 1 capsule (200 mg total) by mouth daily.  . Cholecalciferol (VITAMIN D PO) Take 4,000 Units by mouth daily.   . Ginger, Zingiber officinalis, (GINGER ROOT PO) Take by mouth daily.  Marland Kitchen levocetirizine (XYZAL) 5 MG tablet   . losartan (COZAAR) 100 MG tablet TAKE 1 TABLET BY MOUTH ONCE DAILY FOR BLOOD PRESSURE  . Misc Natural Products (TART CHERRY ADVANCED PO) Take by mouth daily.  . montelukast (SINGULAIR) 10 MG tablet Take 10 mg by mouth at bedtime.  . Multiple Vitamins-Minerals (MULTIVITAMIN PO) Take by mouth daily.   Marland Kitchen OVER THE COUNTER MEDICATION daily. Takes magnical-d  . OVER THE COUNTER MEDICATION Takes Vinali  . pantoprazole (PROTONIX) 40 MG tablet Take      1 tablet      Daily       for Indigestion & Acid Reflux  . pravastatin (PRAVACHOL) 40 MG tablet TAKE ONE TABLET BY MOUTH AT BEDTIME FOR CHOLESTEROL  . TART CHERRY PO Take by mouth daily.  Marland Kitchen triamcinolone ointment (KENALOG) 0.1 % Apply 1 application topically 2 (two) times daily. (Patient taking differently: Apply 1 application topically as needed. )  . cyclobenzaprine (FLEXERIL) 5 MG tablet Take 1/2 to 1 tablet 2 to 3 x /day if needed for Muscle Spasms (Patient not taking: Reported on 06/28/2020)  . OVER THE COUNTER MEDICATION Bone strenght vitamin with Calcium and Magnesium (Patient not taking: Reported on 06/28/2020)   No current facility-administered medications on file prior to visit.     Allergies:  Allergies  Allergen Reactions  . Ace Inhibitors     cough  . Azithromycin     rash  . Prednisone     High dose  . Vicodin [Hydrocodone-Acetaminophen]     Nausea/vomiting     Medical History:  Past Medical History:  Diagnosis Date  . Allergy   . Asthma   . Hyperlipidemia   . Hypertension   . Migraine   . NSAID induced gastritis  03/01/2020  . Obesity   . Prediabetes   . Vitamin D deficiency    Family history- Reviewed and unchanged Social history- Reviewed and unchanged   Review of Systems:  Review of Systems  Constitutional: Negative for malaise/fatigue and weight loss.  HENT: Negative for hearing loss and tinnitus.   Eyes: Negative for blurred vision and double vision.  Respiratory: Negative for cough, shortness of breath and wheezing.   Cardiovascular: Negative for chest pain, palpitations, orthopnea, claudication and leg swelling.  Gastrointestinal: Negative for abdominal pain, blood in stool, constipation, diarrhea, heartburn, melena, nausea and vomiting.  Genitourinary: Negative.   Musculoskeletal: Positive for joint pain (bil thumbs). Negative for falls, myalgias and neck pain.  Skin: Negative for rash.  Neurological: Negative for dizziness, tingling, sensory change, weakness and headaches.  Endo/Heme/Allergies: Negative for polydipsia.  Psychiatric/Behavioral: Negative.   All other systems reviewed and are negative.   Physical Exam: BP (!) 158/94   Pulse 82   Temp (!)  97.2 F (36.2 C)   Wt 190 lb (86.2 kg)   SpO2 96%   BMI 32.61 kg/m  Wt Readings from Last 3 Encounters:  06/28/20 190 lb (86.2 kg)  03/22/20 191 lb (86.6 kg)  03/01/20 193 lb 9.6 oz (87.8 kg)   General Appearance: Well nourished, in no apparent distress. Eyes: PERRLA, EOMs, conjunctiva no swelling or erythema Sinuses: No Frontal/maxillary tenderness ENT/Mouth: Ext aud canals clear, TMs without erythema, bulging. No erythema, swelling, or exudate on post pharynx.  Tonsils not swollen or erythematous. Hearing normal.  Neck: Supple, thyroid normal.  Respiratory: Respiratory effort normal, BS equal bilaterally without rales, rhonchi, wheezing or stridor.  Cardio: RRR with no MRGs. Brisk peripheral pulses without edema.  Abdomen: Soft, + BS.  Non-tender, no guarding, rebound, hernias, masses. Lymphatics: Non tender without  lymphadenopathy.  Musculoskeletal: Full ROM without effusion, she does have DIP joint bony enlargement and mild deviations. Normal gait.   Skin: Warm, dry without rashes, lesions, ecchymosis.  Neuro: Cranial nerves intact. No cerebellar symptoms.  Psych: Awake and oriented X 3, normal affect, Insight and Judgment appropriate.    [name], NP 4:21 PM [facility]

## 2020-06-28 ENCOUNTER — Encounter: Payer: Self-pay | Admitting: Adult Health

## 2020-06-28 ENCOUNTER — Ambulatory Visit: Payer: BC Managed Care – PPO | Admitting: Adult Health

## 2020-06-28 ENCOUNTER — Other Ambulatory Visit: Payer: Self-pay

## 2020-06-28 ENCOUNTER — Ambulatory Visit: Payer: BC Managed Care – PPO | Admitting: Adult Health Nurse Practitioner

## 2020-06-28 VITALS — BP 158/94 | HR 82 | Temp 97.2°F | Wt 190.0 lb

## 2020-06-28 DIAGNOSIS — I10 Essential (primary) hypertension: Secondary | ICD-10-CM

## 2020-06-28 DIAGNOSIS — K219 Gastro-esophageal reflux disease without esophagitis: Secondary | ICD-10-CM | POA: Diagnosis not present

## 2020-06-28 DIAGNOSIS — J45909 Unspecified asthma, uncomplicated: Secondary | ICD-10-CM

## 2020-06-28 DIAGNOSIS — Z79899 Other long term (current) drug therapy: Secondary | ICD-10-CM | POA: Diagnosis not present

## 2020-06-28 DIAGNOSIS — E669 Obesity, unspecified: Secondary | ICD-10-CM

## 2020-06-28 DIAGNOSIS — E782 Mixed hyperlipidemia: Secondary | ICD-10-CM | POA: Diagnosis not present

## 2020-06-28 DIAGNOSIS — R7309 Other abnormal glucose: Secondary | ICD-10-CM

## 2020-06-28 DIAGNOSIS — M19041 Primary osteoarthritis, right hand: Secondary | ICD-10-CM

## 2020-06-28 DIAGNOSIS — M19042 Primary osteoarthritis, left hand: Secondary | ICD-10-CM

## 2020-06-28 DIAGNOSIS — E559 Vitamin D deficiency, unspecified: Secondary | ICD-10-CM

## 2020-06-28 MED ORDER — BISOPROLOL-HYDROCHLOROTHIAZIDE 5-6.25 MG PO TABS: ORAL_TABLET | ORAL | 3 refills | Status: DC

## 2020-06-28 MED ORDER — BISOPROLOL-HYDROCHLOROTHIAZIDE 5-6.25 MG PO TABS: ORAL_TABLET | ORAL | 1 refills | Status: DC

## 2020-06-28 NOTE — Patient Instructions (Signed)
Goals    . Blood Pressure < 130/80    . LDL CALC < 100    . Weight (lb) < 180 lb (81.6 kg)       Please avoid stopping blood pressure medication!!! Very important to keep this well controlled, and avoid stopping ziac (bisoprolol) abruptly as this can cause REBOUND hypertension    HYPERTENSION INFORMATION  Monitor your blood pressure at home, please keep a record and bring that in with you to your next office visit.   Go to the ER if any CP, SOB, nausea, dizziness, severe HA, changes vision/speech  Testing/Procedures: HOW TO TAKE YOUR BLOOD PRESSURE:  Rest 5 minutes before taking your blood pressure.  Don't smoke or drink caffeinated beverages for at least 30 minutes before.  Take your blood pressure before (not after) you eat.  Sit comfortably with your back supported and both feet on the floor (don't cross your legs).  Elevate your arm to heart level on a table or a desk.  Use the proper sized cuff. It should fit smoothly and snugly around your bare upper arm. There should be enough room to slip a fingertip under the cuff. The bottom edge of the cuff should be 1 inch above the crease of the elbow.  Your most recent BP: BP: (!) 158/94   Take your medications faithfully as instructed. Maintain a healthy weight. Get at least 150 minutes of aerobic exercise per week. Minimize salt intake. Minimize alcohol intake  DASH Eating Plan DASH stands for "Dietary Approaches to Stop Hypertension." The DASH eating plan is a healthy eating plan that has been shown to reduce high blood pressure (hypertension). Additional health benefits may include reducing the risk of type 2 diabetes mellitus, heart disease, and stroke. The DASH eating plan may also help with weight loss. WHAT DO I NEED TO KNOW ABOUT THE DASH EATING PLAN? For the DASH eating plan, you will follow these general guidelines:  Choose foods with a percent daily value for sodium of less than 5% (as listed on the food  label).  Use salt-free seasonings or herbs instead of table salt or sea salt.  Check with your health care provider or pharmacist before using salt substitutes.  Eat lower-sodium products, often labeled as "lower sodium" or "no salt added."  Eat fresh foods.  Eat more vegetables, fruits, and low-fat dairy products.  Choose whole grains. Look for the word "whole" as the first word in the ingredient list.  Choose fish and skinless chicken or [place] more often than red meat. Limit fish, poultry, and meat to 6 oz (170 g) each day.  Limit sweets, desserts, sugars, and sugary drinks.  Choose heart-healthy fats.  Limit cheese to 1 oz (28 g) per day.  Eat more home-cooked food and less restaurant, buffet, and fast food.  Limit fried foods.  Cook foods using methods other than frying.  Limit canned vegetables. If you do use them, rinse them well to decrease the sodium.  When eating at a restaurant, ask that your food be prepared with less salt, or no salt if possible. WHAT FOODS CAN I EAT? Seek help from a dietitian for individual calorie needs. Grains Whole grain or whole wheat bread. Brown rice. Whole grain or whole wheat pasta. Quinoa, bulgur, and whole grain cereals. Low-sodium cereals. Corn or whole wheat flour tortillas. Whole grain cornbread. Whole grain crackers. Low-sodium crackers. Vegetables Fresh or frozen vegetables (raw, steamed, roasted, or grilled). Low-sodium or reduced-sodium tomato and vegetable juices. Low-sodium or  reduced-sodium tomato sauce and paste. Low-sodium or reduced-sodium canned vegetables.  Fruits All fresh, canned (in natural juice), or frozen fruits. Meat and Other Protein Products Ground beef (85% or leaner), grass-fed beef, or beef trimmed of fat. Skinless chicken or [place]. Ground chicken or [place]. Pork trimmed of fat. All fish and seafood. Eggs. Dried beans, peas, or lentils. Unsalted nuts and seeds. Unsalted canned beans. Dairy Low-fat dairy  products, such as skim or 1% milk, 2% or reduced-fat cheeses, low-fat ricotta or cottage cheese, or plain low-fat yogurt. Low-sodium or reduced-sodium cheeses. Fats and Oils Tub margarines without trans fats. Light or reduced-fat mayonnaise and salad dressings (reduced sodium). Avocado. Safflower, olive, or canola oils. Natural peanut or almond butter. Other Unsalted popcorn and pretzels. The items listed above may not be a complete list of recommended foods or beverages. Contact your dietitian for more options. WHAT FOODS ARE NOT RECOMMENDED? Grains White bread. White pasta. White rice. Refined cornbread. Bagels and croissants. Crackers that contain trans fat. Vegetables Creamed or fried vegetables. Vegetables in a cheese sauce. Regular canned vegetables. Regular canned tomato sauce and paste. Regular tomato and vegetable juices. Fruits Dried fruits. Canned fruit in light or heavy syrup. Fruit juice. Meat and Other Protein Products Fatty cuts of meat. Ribs, chicken wings, bacon, sausage, bologna, salami, chitterlings, fatback, hot dogs, bratwurst, and packaged luncheon meats. Salted nuts and seeds. Canned beans with salt. Dairy Whole or 2% milk, cream, half-and-half, and cream cheese. Whole-fat or sweetened yogurt. Full-fat cheeses or blue cheese. Nondairy creamers and whipped toppings. Processed cheese, cheese spreads, or cheese curds. Condiments Onion and garlic salt, seasoned salt, table salt, and sea salt. Canned and packaged gravies. Worcestershire sauce. Tartar sauce. Barbecue sauce. Teriyaki sauce. Soy sauce, including reduced sodium. Steak sauce. Fish sauce. Oyster sauce. Cocktail sauce. Horseradish. Ketchup and mustard. Meat flavorings and tenderizers. Bouillon cubes. Hot sauce. Tabasco sauce. Marinades. Taco seasonings. Relishes. Fats and Oils Butter, stick margarine, lard, shortening, ghee, and bacon fat. Coconut, palm kernel, or palm oils. Regular salad dressings. Other Pickles and  olives. Salted popcorn and pretzels. The items listed above may not be a complete list of foods and beverages to avoid. Contact your dietitian for more information. WHERE CAN I FIND MORE INFORMATION? National Heart, [employer], and [employer] Institute: travelstabloid.com Document Released: 07/13/2011 Document Revised: 12/08/2013 Document Reviewed: 05/28/2013 [facility] Patient Information 2015 [place], [place]. This information is not intended to replace advice given to you by your health care provider. Make sure you discuss any questions you have with your health care provider.

## 2020-06-29 ENCOUNTER — Other Ambulatory Visit: Payer: Self-pay | Admitting: Internal Medicine

## 2020-06-29 LAB — COMPLETE METABOLIC PANEL WITHOUT GFR
AG Ratio: 2.2 (calc) (ref 1.0–2.5)
ALT: 20 U/L (ref 6–29)
AST: 19 U/L (ref 10–35)
Albumin: 4.6 g/dL (ref 3.6–5.1)
Alkaline phosphatase (APISO): 86 U/L (ref 37–153)
BUN: 20 mg/dL (ref 7–25)
CO2: 26 mmol/L (ref 20–32)
Calcium: 10 mg/dL (ref 8.6–10.4)
Chloride: 107 mmol/L (ref 98–110)
Creat: 0.82 mg/dL (ref 0.50–0.99)
GFR, Est African American: 90 mL/min/1.73m2 (ref >=60)
GFR, Est Non African American: 77 mL/min/1.73m2 (ref >=60)
Globulin: 2.1 g/dL (ref 1.9–3.7)
Glucose, Bld: 81 mg/dL (ref 65–99)
Potassium: 4.2 mmol/L (ref 3.5–5.3)
Sodium: 141 mmol/L (ref 135–146)
Total Bilirubin: 0.4 mg/dL (ref 0.2–1.2)
Total Protein: 6.7 g/dL (ref 6.1–8.1)

## 2020-06-29 LAB — CBC WITH DIFFERENTIAL/PLATELET
Absolute Monocytes: 532 cells/uL (ref 200–950)
Basophils Absolute: 70 cells/uL (ref 0–200)
Basophils Relative: 1 %
Eosinophils Absolute: 119 cells/uL (ref 15–500)
Eosinophils Relative: 1.7 %
HCT: 39.6 % (ref 35.0–45.0)
Hemoglobin: 13.6 g/dL (ref 11.7–15.5)
Lymphs Abs: 1946 cells/uL (ref 850–3900)
MCH: 31.2 pg (ref 27.0–33.0)
MCHC: 34.3 g/dL (ref 32.0–36.0)
MCV: 90.8 fL (ref 80.0–100.0)
MPV: 11.2 fL (ref 7.5–12.5)
Monocytes Relative: 7.6 %
Neutro Abs: 4333 cells/uL (ref 1500–7800)
Neutrophils Relative %: 61.9 %
Platelets: 262 10*3/uL (ref 140–400)
RBC: 4.36 10*6/uL (ref 3.80–5.10)
RDW: 12.6 % (ref 11.0–15.0)
Total Lymphocyte: 27.8 %
WBC: 7 10*3/uL (ref 3.8–10.8)

## 2020-06-29 LAB — LIPID PANEL
Cholesterol: 171 mg/dL (ref <200)
HDL: 46 mg/dL — ABNORMAL LOW (ref >=50)
LDL Cholesterol (Calc): 94 mg/dL
Non-HDL Cholesterol (Calc): 125 mg/dL (ref <130)
Total CHOL/HDL Ratio: 3.7 (calc) (ref <5.0)
Triglycerides: 225 mg/dL — ABNORMAL HIGH (ref <150)

## 2020-06-29 LAB — TSH: TSH: 3.24 m[IU]/L (ref 0.40–4.50)

## 2020-06-29 LAB — MAGNESIUM: Magnesium: 2.3 mg/dL (ref 1.5–2.5)

## 2020-07-12 ENCOUNTER — Ambulatory Visit (INDEPENDENT_AMBULATORY_CARE_PROVIDER_SITE_OTHER): Payer: BC Managed Care – PPO

## 2020-07-12 ENCOUNTER — Other Ambulatory Visit: Payer: Self-pay

## 2020-07-12 DIAGNOSIS — I10 Essential (primary) hypertension: Secondary | ICD-10-CM

## 2020-07-12 NOTE — Progress Notes (Signed)
Patient presents to the office for a nurse visit for a Blood Pressure check. Today's reading was 154/92. Currently taking Ziac 5-6.25, 1 tablet daily and Losartan 100mg , 1 tablet daily. Has been checking at home with readings in the 140's/80's.

## 2020-07-16 ENCOUNTER — Other Ambulatory Visit: Payer: Self-pay | Admitting: Adult Health Nurse Practitioner

## 2020-07-16 DIAGNOSIS — I10 Essential (primary) hypertension: Secondary | ICD-10-CM

## 2020-07-16 MED ORDER — OLMESARTAN MEDOXOMIL 20 MG PO TABS: 20.0000 mg | ORAL_TABLET | Freq: Every day | ORAL | 1 refills | Status: DC

## 2020-07-16 NOTE — Progress Notes (Signed)
Blood pressure above goal.  Therapy chang to Olmesartan 20mg .  Continue to check blood pressure twice a day.  Contact office with any new or worsening symptoms.   Garnet Sierras, Laqueta Jean, DNP [facility] 07/16/2020  11:46 AM

## 2020-07-19 DIAGNOSIS — M180 Bilateral primary osteoarthritis of first carpometacarpal joints: Secondary | ICD-10-CM | POA: Diagnosis not present

## 2020-08-26 DIAGNOSIS — H524 Presbyopia: Secondary | ICD-10-CM | POA: Diagnosis not present

## 2020-09-20 ENCOUNTER — Other Ambulatory Visit: Payer: Self-pay | Admitting: Internal Medicine

## 2020-09-20 DIAGNOSIS — K219 Gastro-esophageal reflux disease without esophagitis: Secondary | ICD-10-CM

## 2020-12-01 DIAGNOSIS — M19041 Primary osteoarthritis, right hand: Secondary | ICD-10-CM | POA: Diagnosis not present

## 2020-12-01 DIAGNOSIS — M180 Bilateral primary osteoarthritis of first carpometacarpal joints: Secondary | ICD-10-CM | POA: Diagnosis not present

## 2020-12-01 DIAGNOSIS — M19042 Primary osteoarthritis, left hand: Secondary | ICD-10-CM | POA: Diagnosis not present

## 2020-12-16 ENCOUNTER — Encounter: Payer: BC Managed Care – PPO | Admitting: Internal Medicine

## 2020-12-20 ENCOUNTER — Encounter: Payer: Self-pay | Admitting: Internal Medicine

## 2020-12-20 NOTE — Patient Instructions (Signed)

## 2020-12-20 NOTE — Progress Notes (Signed)
Annual Screening/Preventative Visit & Comprehensive Evaluation &  Examination  Future Appointments  Date Time Provider [place]  12/21/2020  3:00 PM Unk Pinto, MD [facility] None  12/27/2020  2:30 PM Kozlow, Donnamarie Poag, MD [facility] None  12/21/2021  3:00 PM Unk Pinto, MD [facility] None        This very nice 60 y.o. DWF presents for a Screening /Preventative Visit & comprehensive evaluation and management of multiple medical co-morbidities.  Patient has been followed for HTN, HLD, T2_NIDDM  Prediabetes  and Vitamin D Deficiency. She has GERD controlled w/Protonix.        Today, patient is c/o pain over her Left hip ~ olecranon bursitis.        HTN predates since  2007. Patient's BP has been controlled at home and patient denies any cardiac symptoms as chest pain, palpitations, shortness of breath, dizziness or ankle swelling. Today's BP: 128/72        Patient's hyperlipidemia is controlled with diet and Pravastatin. Patient denies myalgias or other medication SE's. Last lipids were at goal except elevated Trigs:                                                                                                                                                                                                                                           Lab Results  Component Value Date   CHOL 171 06/28/2020   HDL 46 (L) 06/28/2020   LDLCALC 94 06/28/2020   TRIG 225 (H) 06/28/2020   CHOLHDL 3.7 06/28/2020        Patient has hx/o has Morbid obesity (BMI 32.6) and  prediabetes (A1c 5.7% w/ Insulin 37 /2012) and patient denies reactive hypoglycemic symptoms, visual blurring, diabetic polys or paresthesias. Last A1c was normal & at goal:  Lab Results  Component Value Date   HGBA1C 4.9 12/19/2019        Finally, patient has history of Vitamin D Deficiency ("22" /2008) and last Vitamin D was at goal:  Lab Results  Component Value Date   VD25OH 73 03/22/2020      Current Outpatient Medications on File Prior to Visit  Medication Sig  . albuterol HFA  inhaler Inhale  every 6  hours as needed for wheezing   . aspirin 81 MG tablet Take  daily.  . bisoprolol-hctz 5-6.25 MG tablet TAKE 1 TABLET  EVERY DAY   .  SYMBICORT 160-4.5  inhaler Inhale 2 puffs  2 times daily.  . celecoxib  200 MG capsule Take 1 capsule  daily.  Marland Kitchen VITAMIN D  4,000 Units  Take  daily.   Marland Kitchen GINGER ROOT  Take   daily.  Marland Kitchen levocetirizine  5 MG tablet   . TART CHERRY ADVANCED  Take daily.  . montelukast  10 MG tablet Take  at bedtime.  . Multiple Vitamins-Minerals  Take daily.   Marland Kitchen olmesartan 20 MG tablet Take 1 tablet  daily.  . pantoprazole 40 MG tablet TAKE 1 TABLET ONCE DAILY   . Pravastatin 40 MG tablet TAKE ONE TABLET AT BEDTIME   . TART CHERRY  Take  daily.  Marland Kitchen triamcinolone ointment 0.1 % 1 application topically as needed. )      Allergies  Allergen Reactions  . Ace Inhibitors     cough  . Azithromycin     rash  . Prednisone     High dose  . Vicodin [Hydrocodone-Acetaminophen]     Nausea/vomiting     Past Medical History:  Diagnosis Date  . Allergy   . Asthma   . Hyperlipidemia   . Hypertension   . Migraine   . NSAID induced gastritis 03/01/2020  . Obesity   . Prediabetes   . Vitamin D deficiency      Health Maintenance  Topic Date Due  . COVID-19 Vaccine (1) Never done  . HIV Screening  Never done  . PAP SMEAR-Modifier  12/27/2012  . INFLUENZA VACCINE  03/07/2021  . MAMMOGRAM  05/17/2022  . COLONOSCOPY (Pts 45-57yrs Insurance coverage will need to be confirmed)  04/03/2026  . TETANUS/TDAP  08/27/2028  . Hepatitis C Screening  Completed  . HPV VACCINES  Aged Out     Immunization History  Administered Date(s) Administered  . PPD Test 05/12/2014, 05/25/2015, 07/05/2016, 08/03/2017, 08/27/2018  . Pneumococcal-Unspecified 08/29/2009  . Tdap 08/30/2007, 08/27/2018    Last Colon -   03/2016 Dr Melina Copa - recc 5 yr f/u  Last MGM -   04/2019  Past Surgical History:  Procedure Laterality Date  . DILATION AND CURETTAGE, DIAGNOSTIC / THERAPEUTIC  2009  . LYMPH NODE BIOPSY Left 1991   negative  . NEVUS EXCISION  2012   dysplastic from back     Family History  Problem Relation Age of Onset  . Hypertension Mother   . COPD Father   . Cancer Father        colon  . Heart disease Father   . Hyperlipidemia Father   . Hypertension Father   . Stroke Father     Social History   Tobacco Use  . Smoking status: Never Smoker  . Smokeless tobacco: Never Used  Substance Use Topics  . Alcohol use: No  . Drug use: No     ROS Constitutional: Denies fever, chills, weight loss/gain, headaches, insomnia,  night sweats, and change in appetite. Does c/o fatigue. Eyes: Denies redness, blurred vision, diplopia, discharge, itchy, watery eyes.  ENT: Denies discharge, congestion, post nasal drip, epistaxis, sore throat, earache, hearing loss, dental pain, Tinnitus, Vertigo, Sinus pain, snoring.  Cardio: Denies chest pain, palpitations, irregular heartbeat, syncope, dyspnea, diaphoresis, orthopnea, PND, claudication, edema Respiratory: denies cough, dyspnea, DOE, pleurisy, hoarseness, laryngitis, wheezing.  Gastrointestinal: Denies dysphagia, heartburn, reflux, water brash, pain, cramps, nausea, vomiting, bloating, diarrhea, constipation, hematemesis, melena, hematochezia, jaundice, hemorrhoids Genitourinary: Denies dysuria, frequency, urgency, nocturia, hesitancy, discharge, hematuria, flank pain Breast: Breast lumps, nipple discharge, bleeding.  Musculoskeletal: Denies  arthralgia, myalgia, stiffness, Jt. Swelling, pain, limp, and strain/sprain. Denies falls. Skin: Denies puritis, rash, hives, warts, acne, eczema, changing in skin lesion Neuro: No weakness, tremor, incoordination, spasms, paresthesia, pain Psychiatric: Denies confusion, memory loss, sensory loss. Denies Depression. Endocrine: Denies change in weight, skin, hair  change, nocturia, and paresthesia, diabetic polys, visual blurring, hyper / hypo glycemic episodes.  Heme/Lymph: No excessive bleeding, bruising, enlarged lymph nodes.  Physical Exam  BP 128/72   Pulse 68   Temp (!) 97 F (36.1 C)   Ht 5\' 4"  (1.626 m)   Wt 196 lb 12.8 oz (89.3 kg)   SpO2 96%   BMI 33.78 kg/m   General Appearance: Well nourished, well groomed and in no apparent distress.  Eyes: PERRLA, EOMs, conjunctiva no swelling or erythema, normal fundi and vessels. Sinuses: No frontal/maxillary tenderness ENT/Mouth: EACs patent / TMs  nl. Nares clear without erythema, swelling, mucoid exudates. Oral hygiene is good. No erythema, swelling, or exudate. Tongue normal, non-obstructing. Tonsils not swollen or erythematous. Hearing normal.  Neck: Supple, thyroid not palpable. No bruits, nodes or JVD. Respiratory: Respiratory effort normal.  BS equal and clear bilateral without rales, rhonci, wheezing or stridor. Cardio: Heart sounds are normal with regular rate and rhythm and no murmurs, rubs or gallops. Peripheral pulses are normal and equal bilaterally without edema. No aortic or femoral bruits. Chest: symmetric with normal excursions and percussion. Breasts: Symmetric, without lumps, nipple discharge, retractions, or fibrocystic changes.  Abdomen: Flat, soft with bowel sounds active. Nontender, no guarding, rebound, hernias, masses, or organomegaly.  Lymphatics: Non tender without lymphadenopathy.  Musculoskeletal: Full ROM all peripheral extremities, joint stability, 5/5 strength, and normal gait.             Tender Lt hip olecranon bursa.  Skin: Warm and dry without rashes, lesions, cyanosis, clubbing or  ecchymosis.  Neuro: Cranial nerves intact, reflexes equal bilaterally. Normal muscle tone, no cerebellar symptoms. Sensation intact.  Pysch: Alert and oriented X 3, normal affect, Insight and Judgment appropriate.    Assessment and Plan  1. Annual Preventative Screening  Examination   2. Essential hypertension  - EKG 12-Lead - [place], RETROPERITNL ABD,  LTD - Urinalysis, Routine w reflex microscopic - Microalbumin / creatinine urine ratio - CBC with Differential/Platelet - COMPLETE METABOLIC PANEL WITH GFR - Magnesium - TSH  3. Hyperlipidemia, mixed  - EKG 12-Lead - [place], RETROPERITNL ABD,  LTD - Lipid panel - TSH  4. Abnormal glucose  - EKG 12-Lead - [place], RETROPERITNL ABD,  LTD - Insulin, random - Hemoglobin A1c  5. Vitamin D deficiency  - VITAMIN D 25 Hydroxy  6. Gastroesophageal reflux disease  - CBC with Differential/Platelet  7. Obesity (BMI 30.0-34.9)  - TSH  8. Screening-pulmonary TB  - TB Skin Test  9. Screening for ischemic heart disease  - EKG 12-Lead  10. FHx: heart disease  - EKG 12-Lead - [place], RETROPERITNL ABD,  LTD  11. Screening for colorectal cancer  - POC Hemoccult Bld/Stl   12. Screening for AAA (aortic abdominal aneurysm)  - [place], RETROPERITNL ABD,  LTD  13. Fatigue  - Iron,Total/Total Iron Binding Cap - Vitamin B12 - CBC with Differential/Platelet - TSH  14. Medication management  - Urinalysis, Routine w reflex microscopic - Microalbumin / creatinine urine ratio - CBC with Differential/Platelet - COMPLETE METABOLIC PANEL WITH GFR - Magnesium - Lipid panel - TSH - Insulin, random - VITAMIN D 25 Hydroxy - Hemoglobin A1c  Patient was counseled in prudent diet to achieve/maintain BMI less than 25 for weight control, BP monitoring, regular exercise and medications.  Patient given a rx for a Decadron pulse taper to try for her bursitis. Discussed med's effects and SE's. Screening labs and tests as requested with regular follow-up as recommended. Over 40 minutes of exam, counseling, chart review and high complex critical decision making was performed.   Kirtland Bouchard, MD

## 2020-12-21 ENCOUNTER — Other Ambulatory Visit: Payer: Self-pay

## 2020-12-21 ENCOUNTER — Ambulatory Visit (INDEPENDENT_AMBULATORY_CARE_PROVIDER_SITE_OTHER): Payer: BC Managed Care – PPO | Admitting: Internal Medicine

## 2020-12-21 ENCOUNTER — Encounter: Payer: Self-pay | Admitting: Internal Medicine

## 2020-12-21 VITALS — BP 128/72 | HR 68 | Temp 97.0°F | Ht 64.0 in | Wt 196.8 lb

## 2020-12-21 DIAGNOSIS — Z13220 Encounter for screening for lipoid disorders: Secondary | ICD-10-CM | POA: Diagnosis not present

## 2020-12-21 DIAGNOSIS — Z1211 Encounter for screening for malignant neoplasm of colon: Secondary | ICD-10-CM

## 2020-12-21 DIAGNOSIS — Z111 Encounter for screening for respiratory tuberculosis: Secondary | ICD-10-CM

## 2020-12-21 DIAGNOSIS — Z1329 Encounter for screening for other suspected endocrine disorder: Secondary | ICD-10-CM | POA: Diagnosis not present

## 2020-12-21 DIAGNOSIS — I10 Essential (primary) hypertension: Secondary | ICD-10-CM

## 2020-12-21 DIAGNOSIS — Z1389 Encounter for screening for other disorder: Secondary | ICD-10-CM | POA: Diagnosis not present

## 2020-12-21 DIAGNOSIS — Z130 Encounter for screening for diseases of the blood and blood-forming organs and certain disorders involving the immune mechanism: Secondary | ICD-10-CM

## 2020-12-21 DIAGNOSIS — Z131 Encounter for screening for diabetes mellitus: Secondary | ICD-10-CM | POA: Diagnosis not present

## 2020-12-21 DIAGNOSIS — R7309 Other abnormal glucose: Secondary | ICD-10-CM

## 2020-12-21 DIAGNOSIS — E782 Mixed hyperlipidemia: Secondary | ICD-10-CM

## 2020-12-21 DIAGNOSIS — Z0001 Encounter for general adult medical examination with abnormal findings: Secondary | ICD-10-CM

## 2020-12-21 DIAGNOSIS — Z79899 Other long term (current) drug therapy: Secondary | ICD-10-CM

## 2020-12-21 DIAGNOSIS — Z0000 Encounter for general adult medical examination without abnormal findings: Secondary | ICD-10-CM

## 2020-12-21 DIAGNOSIS — E559 Vitamin D deficiency, unspecified: Secondary | ICD-10-CM | POA: Diagnosis not present

## 2020-12-21 DIAGNOSIS — Z1212 Encounter for screening for malignant neoplasm of rectum: Secondary | ICD-10-CM

## 2020-12-21 DIAGNOSIS — E66811 Obesity, class 1: Secondary | ICD-10-CM

## 2020-12-21 DIAGNOSIS — Z136 Encounter for screening for cardiovascular disorders: Secondary | ICD-10-CM

## 2020-12-21 DIAGNOSIS — Z8249 Family history of ischemic heart disease and other diseases of the circulatory system: Secondary | ICD-10-CM

## 2020-12-21 DIAGNOSIS — E669 Obesity, unspecified: Secondary | ICD-10-CM

## 2020-12-21 DIAGNOSIS — K219 Gastro-esophageal reflux disease without esophagitis: Secondary | ICD-10-CM

## 2020-12-21 DIAGNOSIS — R5383 Other fatigue: Secondary | ICD-10-CM

## 2020-12-21 MED ORDER — OLMESARTAN MEDOXOMIL 40 MG PO TABS: ORAL_TABLET | ORAL | 3 refills | Status: DC

## 2020-12-21 MED ORDER — DEXAMETHASONE 4 MG PO TABS: ORAL_TABLET | ORAL | 0 refills | Status: DC

## 2020-12-22 LAB — CBC WITH DIFFERENTIAL/PLATELET
Absolute Monocytes: 510 cells/uL (ref 200–950)
Basophils Absolute: 98 cells/uL (ref 0–200)
Basophils Relative: 1.3 %
Eosinophils Absolute: 150 cells/uL (ref 15–500)
Eosinophils Relative: 2 %
HCT: 41.1 % (ref 35.0–45.0)
Hemoglobin: 14.2 g/dL (ref 11.7–15.5)
Lymphs Abs: 1913 cells/uL (ref 850–3900)
MCH: 32.1 pg (ref 27.0–33.0)
MCHC: 34.5 g/dL (ref 32.0–36.0)
MCV: 92.8 fL (ref 80.0–100.0)
MPV: 11.1 fL (ref 7.5–12.5)
Monocytes Relative: 6.8 %
Neutro Abs: 4830 cells/uL (ref 1500–7800)
Neutrophils Relative %: 64.4 %
Platelets: 219 10*3/uL (ref 140–400)
RBC: 4.43 10*6/uL (ref 3.80–5.10)
RDW: 12.6 % (ref 11.0–15.0)
Total Lymphocyte: 25.5 %
WBC: 7.5 10*3/uL (ref 3.8–10.8)

## 2020-12-22 LAB — COMPLETE METABOLIC PANEL WITH GFR
AG Ratio: 2.8 (calc) — ABNORMAL HIGH (ref 1.0–2.5)
ALT: 20 U/L (ref 6–29)
AST: 21 U/L (ref 10–35)
Albumin: 4.7 g/dL (ref 3.6–5.1)
Alkaline phosphatase (APISO): 70 U/L (ref 37–153)
BUN/Creatinine Ratio: 19 (calc) (ref 6–22)
BUN: 21 mg/dL (ref 7–25)
CO2: 28 mmol/L (ref 20–32)
Calcium: 10.2 mg/dL (ref 8.6–10.4)
Chloride: 105 mmol/L (ref 98–110)
Creat: 1.11 mg/dL — ABNORMAL HIGH (ref 0.50–0.99)
GFR, Est African American: 62 mL/min/{1.73_m2} (ref >=60)
GFR, Est Non African American: 54 mL/min/{1.73_m2} — ABNORMAL LOW (ref >=60)
Globulin: 1.7 g/dL (calc) — ABNORMAL LOW (ref 1.9–3.7)
Glucose, Bld: 95 mg/dL (ref 65–99)
Potassium: 4.4 mmol/L (ref 3.5–5.3)
Sodium: 142 mmol/L (ref 135–146)
Total Bilirubin: 0.5 mg/dL (ref 0.2–1.2)
Total Protein: 6.4 g/dL (ref 6.1–8.1)

## 2020-12-22 LAB — URINALYSIS, ROUTINE W REFLEX MICROSCOPIC
Bilirubin Urine: NEGATIVE
Glucose, UA: NEGATIVE
Hgb urine dipstick: NEGATIVE
Ketones, ur: NEGATIVE
Leukocytes,Ua: NEGATIVE
Nitrite: NEGATIVE
Protein, ur: NEGATIVE
Specific Gravity, Urine: 1.008 (ref 1.001–1.035)
pH: 6.5 (ref 5.0–8.0)

## 2020-12-22 LAB — LIPID PANEL
Cholesterol: 173 mg/dL (ref <200)
HDL: 52 mg/dL (ref >=50)
LDL Cholesterol (Calc): 94 mg/dL (calc)
Non-HDL Cholesterol (Calc): 121 mg/dL (calc) (ref <130)
Total CHOL/HDL Ratio: 3.3 (calc) (ref <5.0)
Triglycerides: 170 mg/dL — ABNORMAL HIGH (ref <150)

## 2020-12-22 LAB — HEMOGLOBIN A1C
Hgb A1c MFr Bld: 5.3 % of total Hgb (ref <5.7)
Mean Plasma Glucose: 105 mg/dL
eAG (mmol/L): 5.8 mmol/L

## 2020-12-22 LAB — MICROALBUMIN / CREATININE URINE RATIO
Creatinine, Urine: 50 mg/dL (ref 20–275)
Microalb Creat Ratio: 4 mcg/mg creat (ref <30)
Microalb, Ur: 0.2 mg/dL

## 2020-12-22 LAB — INSULIN, RANDOM: Insulin: 21.2 u[IU]/mL — ABNORMAL HIGH

## 2020-12-22 LAB — IRON, TOTAL/TOTAL IRON BINDING CAP
%SAT: 30 % (ref 16–45)
Iron: 107 ug/dL (ref 45–160)
TIBC: 360 ug/dL (ref 250–450)

## 2020-12-22 LAB — TSH: TSH: 3.04 mIU/L (ref 0.40–4.50)

## 2020-12-22 LAB — MAGNESIUM: Magnesium: 2.4 mg/dL (ref 1.5–2.5)

## 2020-12-22 LAB — VITAMIN B12: Vitamin B-12: 869 pg/mL (ref 200–1100)

## 2020-12-22 LAB — VITAMIN D 25 HYDROXY (VIT D DEFICIENCY, FRACTURES): Vit D, 25-Hydroxy: 95 ng/mL (ref 30–100)

## 2020-12-22 NOTE — Progress Notes (Signed)
Aorta Scan complete and normal, <3.

## 2020-12-22 NOTE — Progress Notes (Signed)
============================================================ ============================================================  -    Iron & Vitamin B12 levels - All Nl & OK  ============================================================ ============================================================  - Kidney functions are slightly decreased and most likely due to mild                                                         Dehydration from not drinking enough fluids.    - Recommend drink at least 6 bottles (16 oz) of fluids or water  /day. ============================================================ ============================================================  - Total Chol = 173    and LDL   Chol = 94 - Both  Excellent   - Very low risk for Heart Attack  / Stroke ============================================================ ============================================================  - Vitamin D = 95 - Excellent  ============================================================ ============================================================  - A1c - Normal -great  - No Diabetes ! ============================================================ ============================================================  - All Else - CBC -  Electrolytes - Liver - Magnesium & Thyroid    - all  Normal / OK ============================================================ ============================================================  - So . . . . . Drink lots more fluids and call office to schedule a recheck of                                                                  Kidney function tests in 3 to 4 weeks  ============================================================

## 2020-12-23 ENCOUNTER — Encounter: Payer: BC Managed Care – PPO | Admitting: Physician Assistant

## 2020-12-24 LAB — TB SKIN TEST
Induration: 0 mm
TB Skin Test: NEGATIVE

## 2020-12-27 ENCOUNTER — Other Ambulatory Visit: Payer: Self-pay | Admitting: Internal Medicine

## 2020-12-27 ENCOUNTER — Ambulatory Visit: Payer: BC Managed Care – PPO | Admitting: Allergy and Immunology

## 2020-12-27 ENCOUNTER — Other Ambulatory Visit: Payer: Self-pay | Admitting: Adult Health

## 2020-12-27 ENCOUNTER — Encounter: Payer: Self-pay | Admitting: Allergy and Immunology

## 2020-12-27 ENCOUNTER — Other Ambulatory Visit: Payer: Self-pay

## 2020-12-27 VITALS — BP 118/72 | HR 86 | Resp 16 | Ht 64.0 in | Wt 201.0 lb

## 2020-12-27 DIAGNOSIS — J4540 Moderate persistent asthma, uncomplicated: Secondary | ICD-10-CM | POA: Diagnosis not present

## 2020-12-27 DIAGNOSIS — K219 Gastro-esophageal reflux disease without esophagitis: Secondary | ICD-10-CM

## 2020-12-27 DIAGNOSIS — J301 Allergic rhinitis due to pollen: Secondary | ICD-10-CM | POA: Diagnosis not present

## 2020-12-27 MED ORDER — OMEPRAZOLE 40 MG PO CPDR: DELAYED_RELEASE_CAPSULE | ORAL | 0 refills | Status: DC

## 2020-12-27 MED ORDER — TRELEGY ELLIPTA 200-62.5-25 MCG/INH IN AEPB: INHALATION_SPRAY | RESPIRATORY_TRACT | 5 refills | Status: DC

## 2020-12-27 MED ORDER — MONTELUKAST SODIUM 10 MG PO TABS: 10.0000 mg | ORAL_TABLET | Freq: Every day | ORAL | 5 refills | Status: DC

## 2020-12-27 MED ORDER — LEVOCETIRIZINE DIHYDROCHLORIDE 5 MG PO TABS: ORAL_TABLET | ORAL | 5 refills | Status: AC

## 2020-12-27 NOTE — Progress Notes (Signed)
[place]   Dear Rhonda [name],  Thank you for referring Rhonda [name] to the [facility] on 12/27/2020.   Below is a summation of this patient's evaluation and recommendations.  Thank you for your referral. I will keep you informed about this patient's response to treatment.   If you have any questions please do not hesitate to contact me.   Sincerely,  Jiles Prows, MD Allergy / Immunology [place] [facility]   ______________________________________________________________________    NEW PATIENT NOTE  Referring Provider: Unk Pinto, MD Primary Provider: Unk Pinto, MD Date of office visit: 12/27/2020    Subjective:   Chief Complaint:  Rhonda [name] (DOB: February 06, 1961) is a 60 y.o. female who presents to the clinic on 12/27/2020 with a chief complaint of Asthma and Allergic Rhinitis  .     HPI: Rhonda [name] presents to this clinic in evaluation of allergies and asthma.  She has apparently been under the care of a [facility] allergist for her respiratory issue for a prolonged period in time.  Her history dates back over 2 decades with recurrent issues of nasal congestion and sneezing and some yellow to clear nasal discharge and some occasional itchy eyes and coughing.  She does not have a history of recurrent headaches or anosmia.  Provoking factors for her symptoms include exposure to pollens, outdoors during the spring and fall, and it is during the spring and fall that she has her worst symptoms even though she consistently uses medications directed against her allergic disease.  In addition, she has asthma which is defined as coughing.  She does not have any wheezing or shortness of breath or chest tightness and she can apparently exert herself without any problem and does not have any cold air and bronchospastic symptoms.  She has coughing and she  has throat clearing and she has drainage and she sometimes gets raspy by the end of the day especially during "peak season" which is spring and fall.  She currently uses Advair and Singulair which she has been using for a prolonged period in time yet once again still remains symptomatic regarding her upper and lower airway symptoms.  She does not like to use nasal steroids because of burning of her nose and she cannot tolerate nasal antihistamines.  She does take an oral antihistamine every day.  Past Medical History:  Diagnosis Date  . Allergy   . Asthma   . Eczema   . Hyperlipidemia   . Hypertension   . Migraine   . NSAID induced gastritis 03/01/2020  . Obesity   . Prediabetes   . Vitamin D deficiency     Past Surgical History:  Procedure Laterality Date  . DILATION AND CURETTAGE, DIAGNOSTIC / THERAPEUTIC  2009  . ENDOMETRIAL ABLATION    . LYMPH NODE BIOPSY Left 1991   negative  . NEVUS EXCISION  2012   dysplastic from back    Allergies as of 12/27/2020      Reactions   Ace Inhibitors    cough   Azithromycin    rash   Prednisone    High dose   Vicodin [hydrocodone-acetaminophen]    Nausea/vomiting      Medication List    Advair Diskus 250-50 MCG/ACT Aepb Generic drug: fluticasone-salmeterol Inhale 1 puff into the lungs 2 (two) times daily.   albuterol 108 (90 Base) MCG/ACT inhaler Commonly known  as: VENTOLIN HFA Inhale into the lungs every 6 (six) hours as needed for wheezing or shortness of breath.   aspirin 81 MG tablet Take 81 mg by mouth daily.   azelastine 0.1 % nasal spray Commonly known as: ASTELIN Place into both nostrils.   bisoprolol-hydrochlorothiazide 5-6.25 MG tablet Commonly known as: ZIAC TAKE 1 TABLET BY MOUTH EVERY DAY FOR BLOOD PRESSURE   levocetirizine 5 MG tablet Commonly known as: XYZAL   montelukast 10 MG tablet Commonly known as: SINGULAIR Take 10 mg by mouth at bedtime.   olmesartan 40 MG tablet Commonly known as:  BENICAR Take  1 tablet  every night for BP   pantoprazole 40 MG tablet Commonly known as: PROTONIX TAKE 1 TABLET ONCE DAILY FOR INDIGESTION/ACID REFLUX   pravastatin 40 MG tablet Commonly known as: PRAVACHOL TAKE ONE TABLET BY MOUTH AT BEDTIME FOR CHOLESTEROL   VITAMIN D PO Take 4,000 Units by mouth daily.       Review of systems negative except as noted in HPI / PMHx or noted below:  Review of Systems  Constitutional: Negative.   HENT: Negative.   Eyes: Negative.   Respiratory: Negative.   Cardiovascular: Negative.   Gastrointestinal: Negative.   Genitourinary: Negative.   Musculoskeletal: Negative.   Skin: Negative.   Neurological: Negative.   Endo/Heme/Allergies: Negative.   Psychiatric/Behavioral: Negative.     Family History  Problem Relation Age of Onset  . Hypertension Mother   . COPD Father   . Cancer Father        colon  . Heart disease Father   . Hyperlipidemia Father   . Hypertension Father   . Stroke Father   . Allergic rhinitis Neg Hx   . Angioedema Neg Hx   . Asthma Neg Hx   . Atopy Neg Hx   . Eczema Neg Hx   . Immunodeficiency Neg Hx   . Urticaria Neg Hx     Social History   Socioeconomic History  . Marital status: Divorced    Spouse name: Not on file  . Number of children: Not on file  . Years of education: Not on file  . Highest education level: Not on file  Occupational History  . Not on file  Tobacco Use  . Smoking status: Never Smoker  . Smokeless tobacco: Never Used  Substance and Sexual Activity  . Alcohol use: No  . Drug use: No  . Sexual activity: Not on file  Other Topics Concern  . Not on file  Social History Narrative  . Not on file   Environmental and Social history  Lives in a house with a dry environment, no animals located inside the household, carpet in the bedroom, no plastic on the bed, no plastic on the pillow, no smoking ongoing inside the household.  She works in an office setting as an Scientist, forensic.  Objective:   Vitals:   12/27/20 1445  BP: 118/72  Pulse: 86  Resp: 16  SpO2: 96%   Height: 5\' 4"  (162.6 cm) Weight: 201 lb (91.2 kg)  Physical Exam Constitutional:      Appearance: She is not diaphoretic.     Comments: Slightly raspy voice  HENT:     Head: Normocephalic.     Right Ear: Tympanic membrane, ear canal and external ear normal.     Left Ear: Tympanic membrane, ear canal and external ear normal.     Nose: Nose normal. No mucosal edema or rhinorrhea.     Mouth/Throat:  Pharynx: Uvula midline. No oropharyngeal exudate.  Eyes:     Conjunctiva/sclera: Conjunctivae normal.  Neck:     Thyroid: No thyromegaly.     Trachea: Trachea normal. No tracheal tenderness or tracheal deviation.  Cardiovascular:     Rate and Rhythm: Normal rate and regular rhythm.     Heart sounds: Normal heart sounds, S1 normal and S2 normal. No murmur heard.   Pulmonary:     Effort: No respiratory distress.     Breath sounds: Normal breath sounds. No stridor. No wheezing or rales.  Lymphadenopathy:     Head:     Right side of head: No tonsillar adenopathy.     Left side of head: No tonsillar adenopathy.     Cervical: No cervical adenopathy.  Skin:    Findings: No erythema or rash.     Nails: There is no clubbing.  Neurological:     Mental Status: She is alert.     Diagnostics: Allergy skin tests were performed.  Demonstrate hypersensitivity to grass and ragweed.  Spirometry was performed and demonstrated an FEV1 of 2.37 @ 97 % of predicted.   Results of blood tests obtained 21 Dec 2020 identifies WBC 7.5, absolute eosinophil 150, absolute lymphocyte 1913, hemoglobin 14.2, platelet 219,  Assessment and Plan:    1. Not well controlled moderate persistent asthma   2. Seasonal allergic rhinitis due to pollen   3. LPRD (laryngopharyngeal reflux disease)     1.  Allergen avoidance measures - pollens  2.  Treat and prevent inflammation:   A. Montelukast 10 mg  - 1 tablet 1 time per day  B. Trelegy 200 - 1 inhalation 1 time per day (Advair)  C. Nasacort - 1 spray each nostril 1 time per day  3. Treat and prevent LPR:   A. Omeprazole 40 mg - 1 tablet 2 times a day for 4 weeks  4. If needed:   A. Xyzal 5 mg - 1 tablet 1-2 times per day  B. Albuterol HFA - 2 inhalations every 4-6 hours  5. Return to clinic in 4 weeks or earlier if problem  Surah has an irritated and inflamed respiratory track and it is quite possible that atopic disease is playing the major role giving rise to this issue but she has a history that is also consistent with LPR.  We will treat her with anti-inflammatory agents for her airway as noted above and have her use omeprazole twice a day for the next 4 weeks.  I will regroup with her at that point in time and we will make a decision about further evaluation and treatment based upon her response to this approach.  Jiles Prows, MD Allergy / Immunology [facility]

## 2020-12-27 NOTE — Patient Instructions (Addendum)
  1.  Allergen avoidance measures - pollens  2.  Treat and prevent inflammation:   A. Montelukast 10 mg - 1 tablet 1 time per day  B. Trelegy 200 - 1 inhalation 1 time per day (Advair)  C. Nasacort - 1 spray each nostril 1 time per day  3. Treat and prevent LPR:   A. Omeprazole 40 mg - 1 tablet 2 times a day for 4 weeks  4. If needed:   A. Xyzal 5 mg - 1 tablet 1-2 times per day  B. Albuterol HFA - 2 inhalations every 4-6 hours  5. Return to clinic in 4 weeks or earlier if problem

## 2020-12-28 ENCOUNTER — Encounter: Payer: Self-pay | Admitting: Allergy and Immunology

## 2020-12-29 NOTE — Addendum Note (Signed)
Addended by: Guy Franco on: 12/29/2020 03:14 PM   Modules accepted: Orders

## 2021-01-24 ENCOUNTER — Other Ambulatory Visit: Payer: Self-pay

## 2021-01-24 ENCOUNTER — Other Ambulatory Visit: Payer: BC Managed Care – PPO

## 2021-01-24 DIAGNOSIS — Z79899 Other long term (current) drug therapy: Secondary | ICD-10-CM

## 2021-01-24 LAB — COMPLETE METABOLIC PANEL WITH GFR
AG Ratio: 2.3 (calc) (ref 1.0–2.5)
ALT: 22 U/L (ref 6–29)
AST: 23 U/L (ref 10–35)
Albumin: 4.8 g/dL (ref 3.6–5.1)
Alkaline phosphatase (APISO): 72 U/L (ref 37–153)
BUN: 16 mg/dL (ref 7–25)
CO2: 30 mmol/L (ref 20–32)
Calcium: 10.2 mg/dL (ref 8.6–10.4)
Chloride: 104 mmol/L (ref 98–110)
Creat: 0.88 mg/dL (ref 0.50–0.99)
GFR, Est African American: 82 mL/min/{1.73_m2} (ref >=60)
GFR, Est Non African American: 71 mL/min/{1.73_m2} (ref >=60)
Globulin: 2.1 g/dL (calc) (ref 1.9–3.7)
Glucose, Bld: 91 mg/dL (ref 65–99)
Potassium: 4.2 mmol/L (ref 3.5–5.3)
Sodium: 141 mmol/L (ref 135–146)
Total Bilirubin: 0.5 mg/dL (ref 0.2–1.2)
Total Protein: 6.9 g/dL (ref 6.1–8.1)

## 2021-01-27 ENCOUNTER — Other Ambulatory Visit: Payer: Self-pay

## 2021-01-27 ENCOUNTER — Ambulatory Visit: Payer: BC Managed Care – PPO | Admitting: Allergy and Immunology

## 2021-01-27 VITALS — BP 136/82 | HR 72

## 2021-01-27 DIAGNOSIS — J301 Allergic rhinitis due to pollen: Secondary | ICD-10-CM

## 2021-01-27 DIAGNOSIS — K219 Gastro-esophageal reflux disease without esophagitis: Secondary | ICD-10-CM

## 2021-01-27 DIAGNOSIS — J4540 Moderate persistent asthma, uncomplicated: Secondary | ICD-10-CM | POA: Diagnosis not present

## 2021-01-27 NOTE — Progress Notes (Signed)
[place]   Follow-up Note  Referring Provider: Unk Pinto, MD Primary Provider: Unk Pinto, MD Date of Office Visit: 01/27/2021  Subjective:   Rhonda [name] (DOB: Dec 01, 1960) is a 60 y.o. female who returns to the [facility] on 01/27/2021 in re-evaluation of the following:  HPI: Rhonda [name] returns to this clinic in evaluation of asthma and allergic rhinoconjunctivitis.  Her last visit to this clinic was her initial evaluation of 27 Dec 2020.  When she was last seen in this clinic we gave her a triple inhaler to use and she has noticed that she has had much less cough and has had no need to use a short acting bronchodilator.    As well, we asked her to treat her LPR a little more aggressively and she has noticed some decrease in throat clearing and postnasal drip although she still has some intermittent raspy voice.  She has not been have any classic reflux symptoms.  She is tolerating the Nasacort very well and she has not really been having any problems with her nose.  Allergies as of 01/27/2021       Reactions   Ace Inhibitors    cough   Azithromycin    rash   Prednisone    High dose   Vicodin [hydrocodone-acetaminophen]    Nausea/vomiting        Medication List      albuterol 108 (90 Base) MCG/ACT inhaler Commonly known as: VENTOLIN HFA Inhale into the lungs every 6 (six) hours as needed for wheezing or shortness of breath.   aspirin 81 MG tablet Take 81 mg by mouth daily.   azelastine 0.1 % nasal spray Commonly known as: ASTELIN Place into both nostrils.   bisoprolol-hydrochlorothiazide 5-6.25 MG tablet Commonly known as: ZIAC TAKE 1 TABLET BY MOUTH EVERY DAY FOR BLOOD PRESSURE   levocetirizine 5 MG tablet Commonly known as: XYZAL 1 tablet 1-2 times a day as needed   montelukast 10 MG tablet Commonly known as: SINGULAIR Take 1 tablet (10 mg total) by mouth at bedtime.    olmesartan 40 MG tablet Commonly known as: BENICAR Take  1 tablet  every night for BP   omeprazole 40 MG capsule Commonly known as: PRILOSEC 1 tablet 2 times a day for 4 weeks   pantoprazole 40 MG tablet Commonly known as: PROTONIX TAKE 1 TABLET ONCE DAILY FOR INDIGESTION/ACID REFLUX   pravastatin 40 MG tablet Commonly known as: PRAVACHOL TAKE ONE TABLET BY MOUTH AT BEDTIME FOR CHOLESTEROL   Trelegy Ellipta 200-62.5-25 MCG/INH Aepb Generic drug: Fluticasone-Umeclidin-Vilant 1 inhalation 1 time per day. Rinse mouth after use.   VITAMIN D PO Take 4,000 Units by mouth daily.        Past Medical History:  Diagnosis Date   Allergy    Asthma    Eczema    Hyperlipidemia    Hypertension    Migraine    NSAID induced gastritis 03/01/2020   Obesity    Prediabetes    Vitamin D deficiency     Past Surgical History:  Procedure Laterality Date   DILATION AND CURETTAGE, DIAGNOSTIC / THERAPEUTIC  2009   ENDOMETRIAL ABLATION     LYMPH NODE BIOPSY Left 1991   negative   NEVUS EXCISION  2012   dysplastic from back    Review of systems negative except as noted in HPI / PMHx or noted below:  Review of Systems  Constitutional: Negative.  HENT: Negative.    Eyes: Negative.   Respiratory: Negative.    Cardiovascular: Negative.   Gastrointestinal: Negative.   Genitourinary: Negative.   Musculoskeletal: Negative.   Skin: Negative.   Neurological: Negative.   Endo/Heme/Allergies: Negative.   Psychiatric/Behavioral: Negative.      Objective:   Vitals:   01/27/21 0831  BP: 136/82  Pulse: 72  SpO2: 97%          Physical Exam Constitutional:      Appearance: She is not diaphoretic.     Comments: Slightly raspy voice  HENT:     Head: Normocephalic.     Right Ear: Tympanic membrane, ear canal and external ear normal.     Left Ear: Tympanic membrane, ear canal and external ear normal.     Nose: Nose normal. No mucosal edema or rhinorrhea.     Mouth/Throat:      Pharynx: Uvula midline. No oropharyngeal exudate.  Eyes:     Conjunctiva/sclera: Conjunctivae normal.  Neck:     Thyroid: No thyromegaly.     Trachea: Trachea normal. No tracheal tenderness or tracheal deviation.  Cardiovascular:     Rate and Rhythm: Normal rate and regular rhythm.     Heart sounds: Normal heart sounds, S1 normal and S2 normal. No murmur heard. Pulmonary:     Effort: No respiratory distress.     Breath sounds: Normal breath sounds. No stridor. No wheezing or rales.  Lymphadenopathy:     Head:     Right side of head: No tonsillar adenopathy.     Left side of head: No tonsillar adenopathy.     Cervical: No cervical adenopathy.  Skin:    Findings: No erythema or rash.     Nails: There is no clubbing.  Neurological:     Mental Status: She is alert.    Diagnostics:    Spirometry was performed and demonstrated an FEV1 of 2.55 at 104 % of predicted.  Assessment and Plan:   1. Asthma, moderate persistent, well-controlled   2. Seasonal allergic rhinitis due to pollen   3. LPRD (laryngopharyngeal reflux disease)     1.  Allergen avoidance measures - pollens  2.  Treat and prevent inflammation:   A. Montelukast 10 mg - 1 tablet 1 time per day  B. Trelegy 200 - 1 inhalation 1 time per day   C. Nasacort - 1 spray each nostril 1-7 times per week  3. Treat and prevent LPR:   A. Pantoprazole 40 mg - 1 tablet 2 times a day   4. If needed:   A. Xyzal 5 mg - 1 tablet 1-2 times per day  B. Albuterol HFA - 2 inhalations every 4-6 hours  5. Return to clinic in 12 weeks or earlier if problem  Rhonda [name] is doing relatively well with her airway at this point in time and I like to continue to have her utilize therapy directed against inflammation and reflux induced respiratory disease as noted above with the collection of anti-inflammatory agents for her airway and a proton pump inhibitor twice a day for the next 12 weeks.  I will regroup with her at that point in time to  consider further evaluation and treatment based upon her response.  Allena Katz, MD Allergy / Immunology [place]

## 2021-01-27 NOTE — Progress Notes (Signed)
Pt was called  to discuss her lab results. Pt stated she was happy with the results. DG/[facility]

## 2021-01-27 NOTE — Patient Instructions (Addendum)
  1.  Allergen avoidance measures - pollens  2.  Treat and prevent inflammation:   A. Montelukast 10 mg - 1 tablet 1 time per day  B. Trelegy 200 - 1 inhalation 1 time per day   C. Nasacort - 1 spray each nostril 1-7 times per week  3. Treat and prevent LPR:   A. Pantoprazole 40 mg - 1 tablet 2 times a day   4. If needed:   A. Xyzal 5 mg - 1 tablet 1-2 times per day  B. Albuterol HFA - 2 inhalations every 4-6 hours  5. Return to clinic in 12 weeks or earlier if problem

## 2021-01-28 ENCOUNTER — Encounter: Payer: Self-pay | Admitting: Allergy and Immunology

## 2021-02-17 NOTE — Progress Notes (Signed)
Future Appointments  Date Time Provider [place]  02/18/2021 11:30 AM Unk Pinto, MD [facility] None  03/28/2021  - Wellness  4:00 PM Liane Comber, NP [facility] None  12/21/2021  - CPE  3:00 PM Unk Pinto, MD [facility] None    History of Present Illness:        Patient is a very nice 60 yo DWF  who presents with concerns Re: lump on dorsum left foot.  Medications   Current Outpatient Medications (Cardiovascular):    bisoprolol-hydrochlorothiazide (ZIAC) 5-6.25 MG tablet, TAKE 1 TABLET BY MOUTH EVERY DAY FOR BLOOD PRESSURE   olmesartan (BENICAR) 40 MG tablet, Take  1 tablet  every night for BP   pravastatin (PRAVACHOL) 40 MG tablet, TAKE ONE TABLET BY MOUTH AT BEDTIME FOR CHOLESTEROL  Current Outpatient Medications (Respiratory):    albuterol (PROVENTIL HFA;VENTOLIN HFA) 108 (90 BASE) MCG/ACT inhaler, Inhale into the lungs every 6 (six) hours as needed for wheezing or shortness of breath.   azelastine (ASTELIN) 0.1 % nasal spray, Place into both nostrils. (Patient not taking: Reported on 01/27/2021)   Fluticasone-Umeclidin-Vilant (TRELEGY ELLIPTA) 200-62.5-25 MCG/INH AEPB, 1 inhalation 1 time per day. Rinse mouth after use.   levocetirizine (XYZAL) 5 MG tablet, 1 tablet 1-2 times a day as needed   montelukast (SINGULAIR) 10 MG tablet, Take 1 tablet (10 mg total) by mouth at bedtime.  Current Outpatient Medications (Analgesics):    aspirin 81 MG tablet, Take 81 mg by mouth daily.   Current Outpatient Medications (Other):    Cholecalciferol (VITAMIN D PO), Take 4,000 Units by mouth daily.    omeprazole (PRILOSEC) 40 MG capsule, 1 tablet 2 times a day for 4 weeks (Patient not taking: Reported on 01/27/2021)   pantoprazole (PROTONIX) 40 MG tablet, TAKE 1 TABLET ONCE DAILY FOR INDIGESTION/ACID REFLUX  Problem list She has Hypertension; Hyperlipidemia; Vitamin D deficiency; Other abnormal glucose; Asthma; Obesity (BMI 30.0-34.9); Medication management; GERD  (gastroesophageal reflux disease); History of COVID-19; and Osteoarthritis of hands, bilateral on their problem list.   Observations/Objective:  BP 131/83   Pulse 76   Temp (!) 97.4 F (36.3 C)   Resp 16   Ht 5\' 4"  (1.626 m)   Wt 197 lb 6.4 oz (89.5 kg)   SpO2 96%   BMI 33.88 kg/m   There is a soft poorly defined  non tender raised area approx 2.5 cm  x 3.0 cm over the dorsum of the mid foot.   Procedure  (CPT:   [id number] )      After informed consent and aseptic prep with alcohol  the area over the lesion of the dorsal Lt foot was excised with a 2 cm transverse incision with a #10 scalpel. The fatty tissue was not encapsulated and major portions wer excised in a piecemeal fashion.  A small artery was excised & required 2 sutures of chromic 3-0 to secure & control bleeding. Then the wound was closed with 5 vertical mattress sutures  of Nylon 3-0.   The wound was dressed with antibiotic ointment and a pressure dressing.  Patient was instructed in po wound care and given instructions for ER evaluation if bleeding re-occurred.   Assessment and Plan:  1. Essential hypertension   2. Lipoma of lower extremity, unspecified laterality   Follow Up Instructions:        I discussed the assessment and treatment plan with the patient. The patient was provided an opportunity to ask questions and all were answered. The patient agreed  with the plan and demonstrated an understanding of the instructions.       The patient was advised to call back or seek an in-person evaluation if the symptoms worsen or if the condition fails to improve as anticipated.    Kirtland Bouchard, MD

## 2021-02-18 ENCOUNTER — Ambulatory Visit (INDEPENDENT_AMBULATORY_CARE_PROVIDER_SITE_OTHER): Payer: BC Managed Care – PPO | Admitting: Internal Medicine

## 2021-02-18 ENCOUNTER — Other Ambulatory Visit: Payer: Self-pay

## 2021-02-18 VITALS — BP 131/83 | HR 76 | Temp 97.4°F | Resp 16 | Ht 64.0 in | Wt 197.4 lb

## 2021-02-18 DIAGNOSIS — I10 Essential (primary) hypertension: Secondary | ICD-10-CM

## 2021-02-18 DIAGNOSIS — D1720 Benign lipomatous neoplasm of skin and subcutaneous tissue of unspecified limb: Secondary | ICD-10-CM | POA: Diagnosis not present

## 2021-02-22 ENCOUNTER — Telehealth: Payer: Self-pay | Admitting: Allergy and Immunology

## 2021-02-22 ENCOUNTER — Encounter: Payer: Self-pay | Admitting: Internal Medicine

## 2021-02-22 ENCOUNTER — Ambulatory Visit: Payer: BC Managed Care – PPO | Admitting: Internal Medicine

## 2021-02-22 ENCOUNTER — Other Ambulatory Visit: Payer: Self-pay | Admitting: *Deleted

## 2021-02-22 ENCOUNTER — Other Ambulatory Visit: Payer: Self-pay

## 2021-02-22 VITALS — BP 150/88 | HR 72 | Temp 97.5°F | Resp 17 | Ht 64.0 in | Wt 200.8 lb

## 2021-02-22 DIAGNOSIS — D1720 Benign lipomatous neoplasm of skin and subcutaneous tissue of unspecified limb: Secondary | ICD-10-CM

## 2021-02-22 DIAGNOSIS — K219 Gastro-esophageal reflux disease without esophagitis: Secondary | ICD-10-CM

## 2021-02-22 MED ORDER — CEPHALEXIN 500 MG PO CAPS
ORAL_CAPSULE | ORAL | 0 refills | Status: DC
Start: 2021-02-22 — End: 2021-03-28

## 2021-02-22 MED ORDER — MELOXICAM 15 MG PO TABS: ORAL_TABLET | ORAL | 0 refills | Status: DC

## 2021-02-22 MED ORDER — PANTOPRAZOLE SODIUM 40 MG PO TBEC: DELAYED_RELEASE_TABLET | ORAL | 3 refills | Status: DC

## 2021-02-22 NOTE — Telephone Encounter (Signed)
New rx sent to the pharmacy

## 2021-02-22 NOTE — Progress Notes (Signed)
  Patient returns for 4 day f/u  s/p excision of lipoma dorsal Lt foot. Reports having some pain .   & inflamed. No lymphangitis  / streaking or drainage. Wound appears slightly puffy .  Wound redressed with Tegaderm and reinforced with 2" ace wrap.    1. Lipoma of lower extremity, unspecified laterality  - cephALEXin (KEFLEX) 500 MG capsule; Take  1 capsule  4 x /day with Meals & Bedtime with Food for Infection  Dispense: 40 capsule  - meloxicam (MOBIC) 15 MG tablet; Take 1/2 to 1 tablet Daily with Food for Pain & Inflammation  Dispense: 90 tablet;     - ROV 1 week - anticipate suture removal

## 2021-02-22 NOTE — Telephone Encounter (Signed)
Patient states at her last OV, Dr. Neldon Mc told her to double up on her Pantoprazole and take it twice a day instead of once. Patient called [facility] and was told they weren't able to refill it until the end of the month because the prescription does not state the prescription change.

## 2021-02-28 NOTE — Progress Notes (Signed)
   Patient returns s/p excision of lipoma of dorsal Lt foot for wound check & Suture removal.  Healing ridge intact w/o signs of infection and sutures removed and steri strips applied and covered with a non-adherent bandage.

## 2021-03-01 ENCOUNTER — Other Ambulatory Visit: Payer: Self-pay

## 2021-03-01 ENCOUNTER — Encounter: Payer: Self-pay | Admitting: Internal Medicine

## 2021-03-01 ENCOUNTER — Ambulatory Visit: Payer: BC Managed Care – PPO | Admitting: Internal Medicine

## 2021-03-01 VITALS — BP 148/80 | HR 75 | Temp 97.7°F | Resp 16 | Ht 64.0 in | Wt 200.0 lb

## 2021-03-01 DIAGNOSIS — D1720 Benign lipomatous neoplasm of skin and subcutaneous tissue of unspecified limb: Secondary | ICD-10-CM

## 2021-03-25 ENCOUNTER — Encounter: Payer: Self-pay | Admitting: Adult Health

## 2021-03-25 NOTE — Progress Notes (Signed)
FOLLOW UP  Assessment and Plan:   Asthma Well controlled on current regimen Continue meds, avoid triggers  Hypertension Improved on recheck Monitor blood pressure at home; patient to call if consistently greater than 130/80 Continue DASH diet.   Reminder to go to the ER if any CP, SOB, nausea, dizziness, severe HA, changes vision/speech, left arm numbness and tingling and jaw pain.  Cholesterol continue statin therapy; working on lifestyle Continue low cholesterol diet and exercise; reduce saturated fat Check lipid panel.   Other abnormal glucose Recent A1Cs at goal Discussed diet/exercise, weight management  Defer A1C; check CMP  Morbid obesity (HCC) - BMI 35 with htn, hld Long discussion about weight loss, diet, and exercise Recommended diet heavy in fruits and veggies and low in animal meats, cheeses, and dairy products, appropriate calorie intake Patient will work on reducing stress eating, portion control Will follow up in 3 months  Vitamin D Def  At goal at last visit; continue supplementation to maintain goal of 60-100  GERD/ Hx of NSAID induced gastritis Well managed on current medications  Using celebrex sparingly without concerning sx Discussed diet, avoiding triggers and other lifestyle changes  Allergies/cough Follows with allergist; suggested she try nasal steroid at night x 2 weeks to evaluate benefit for secretions/AM cough   Continue diet and meds as discussed. Further disposition pending results of labs. Discussed med's effects and SE's.   Over 30 minutes of exam, counseling, chart review, and critical decision making was performed.   Future Appointments  Date Time Provider [place]  04/21/2021  8:30 AM Kozlow, Donnamarie Poag, MD [facility] None  12/21/2021  3:00 PM Unk Pinto, MD [facility] None    ----------------------------------------------------------------------------------------------------------------------  HPI 60 y.o. female   presents for 3 month follow up on hypertension, cholesterol, glucose management, obesity and vitamin D deficiency.   She has asthma currently well controlled on Symbicort and Singulair. She uses albuterol rarely. Allergies, follows recently with Dr. Tina Griffiths. Has had persistent dry cough, secretions in AM. Taking xyxal. Hasn't tried nasal sprays.   she has a diagnosis of NSAID gastritis (per 2017 EGD) which is currently managed by protonix 40 mg daily (currently short term taking BID due to cough per Dr. Tina Griffiths without improvement).   She has bilateral thumb OA and is seeing Dr. Fredna Dow, she is on celebrex PRN at this time. Her pain is somewhat improved and monitoring for now, wants to avoid surgery.   BMI is Body mass index is 35.19 kg/m., she has been working on diet and exercise. She is getting back on the next 56 days diet (has worked well for her in the past), goal to get back down to 145 lb. She is making better choices, just eating too much.  Wt Readings from Last 3 Encounters:  03/28/21 205 lb (93 kg)  03/01/21 200 lb (90.7 kg)  02/22/21 200 lb 12.8 oz (91.1 kg)    She is on cholesterol medication (pravastatin 40 mg daily) and denies myalgias. Her cholesterol is at goal. The cholesterol last visit was:   Lab Results  Component Value Date   CHOL 173 12/21/2020   HDL 52 12/21/2020   LDLCALC 94 12/21/2020   TRIG 170 (H) 12/21/2020   CHOLHDL 3.3 12/21/2020    She has been working on diet and exercise for glucose management, and denies foot ulcerations, increased appetite, nausea, paresthesia of the feet, polydipsia, polyuria, visual disturbances, vomiting and weight loss. Last A1C in the office was:  Lab Results  Component Value  Date   HGBA1C 5.3 12/21/2020   Last GFR:  Lab Results  Component Value Date   GFRNONAA 71 01/24/2021   Patient is on Vitamin D supplement and at goal at recent check:    Lab Results  Component Value Date   VD25OH 95 12/21/2020       Current  Medications:  Current Outpatient Medications on File Prior to Visit  Medication Sig   albuterol (PROVENTIL HFA;VENTOLIN HFA) 108 (90 BASE) MCG/ACT inhaler Inhale into the lungs every 6 (six) hours as needed for wheezing or shortness of breath.   aspirin 81 MG tablet Take 81 mg by mouth daily.   bisoprolol-hydrochlorothiazide (ZIAC) 5-6.25 MG tablet TAKE 1 TABLET BY MOUTH EVERY DAY FOR BLOOD PRESSURE   Cholecalciferol (VITAMIN D PO) Take 4,000 Units by mouth daily.    Fluticasone-Umeclidin-Vilant (TRELEGY ELLIPTA) 200-62.5-25 MCG/INH AEPB 1 inhalation 1 time per day. Rinse mouth after use.   levocetirizine (XYZAL) 5 MG tablet 1 tablet 1-2 times a day as needed   meloxicam (MOBIC) 15 MG tablet Take 1/2 to 1 tablet Daily with Food for Pain & Inflammation   montelukast (SINGULAIR) 10 MG tablet Take 1 tablet (10 mg total) by mouth at bedtime.   olmesartan (BENICAR) 40 MG tablet Take  1 tablet  every night for BP   pantoprazole (PROTONIX) 40 MG tablet Take one tablet twice daily   pravastatin (PRAVACHOL) 40 MG tablet TAKE ONE TABLET BY MOUTH AT BEDTIME FOR CHOLESTEROL   cephALEXin (KEFLEX) 500 MG capsule Take  1 capsule  4 x /day with Meals & Bedtime with Food for Infection (Patient not taking: Reported on 03/28/2021)   No current facility-administered medications on file prior to visit.     Allergies:  Allergies  Allergen Reactions   Ace Inhibitors     cough   Azithromycin     rash   Prednisone     High dose   Vicodin [Hydrocodone-Acetaminophen]     Nausea/vomiting     Medical History:  Past Medical History:  Diagnosis Date   Allergy    Asthma    Eczema    History of COVID-19 03/01/2020   [facility]-01/14/20    Hyperlipidemia    Hypertension    Migraine    NSAID induced gastritis 03/01/2020   Obesity    Prediabetes    Vitamin D deficiency    Family history- Reviewed and unchanged Social history- Reviewed and unchanged   Review of Systems:  Review of  Systems  Constitutional:  Negative for malaise/fatigue and weight loss.  HENT:  Negative for hearing loss and tinnitus.   Eyes:  Negative for blurred vision and double vision.  Respiratory:  Positive for cough (AM, productive). Negative for shortness of breath and wheezing.   Cardiovascular:  Negative for chest pain, palpitations, orthopnea, claudication and leg swelling.  Gastrointestinal:  Negative for abdominal pain, blood in stool, constipation, diarrhea, heartburn, melena, nausea and vomiting.  Genitourinary: Negative.   Musculoskeletal:  Positive for joint pain (bil thumbs). Negative for falls, myalgias and neck pain.  Skin:  Negative for rash.  Neurological:  Negative for dizziness, tingling, sensory change, weakness and headaches.  Endo/Heme/Allergies:  Positive for environmental allergies. Negative for polydipsia.  Psychiatric/Behavioral: Negative.    All other systems reviewed and are negative.  Physical Exam: BP (!) 154/90   Pulse 68   Temp 97.8 F (36.6 C)   Resp 16   Ht '5\' 4"'$  (1.626 m)   Wt 205 lb (93  kg)   SpO2 97%   BMI 35.19 kg/m  Wt Readings from Last 3 Encounters:  03/28/21 205 lb (93 kg)  03/01/21 200 lb (90.7 kg)  02/22/21 200 lb 12.8 oz (91.1 kg)   General Appearance: Well nourished, in no apparent distress. Eyes: PERRLA, EOMs, conjunctiva no swelling or erythema Sinuses: No Frontal/maxillary tenderness ENT/Mouth: Ext aud canals clear, TMs without erythema, bulging. No erythema, swelling, or exudate on post pharynx.  Tonsils not swollen or erythematous. Hearing normal.  Neck: Supple, thyroid normal.  Respiratory: Respiratory effort normal, BS equal bilaterally without rales, rhonchi, wheezing or stridor.  Cardio: RRR with no MRGs. Brisk peripheral pulses without edema.  Abdomen: Soft, + BS.  Non-tender, no guarding, rebound, hernias, masses. Lymphatics: Non tender without lymphadenopathy.  Musculoskeletal: Full ROM without effusion, she does have DIP joint  bony enlargement and mild deviations. Normal gait.   Skin: Warm, dry without rashes, lesions, ecchymosis.  Neuro: Cranial nerves intact. No cerebellar symptoms.  Psych: Awake and oriented X 3, normal affect, Insight and Judgment appropriate.    Rhonda Ribas, NP 4:51 PM [facility]

## 2021-03-28 ENCOUNTER — Other Ambulatory Visit: Payer: Self-pay

## 2021-03-28 ENCOUNTER — Encounter: Payer: Self-pay | Admitting: Adult Health

## 2021-03-28 ENCOUNTER — Ambulatory Visit: Payer: BC Managed Care – PPO | Admitting: Adult Health

## 2021-03-28 VITALS — BP 138/78 | HR 68 | Temp 97.8°F | Resp 16 | Ht 64.0 in | Wt 205.0 lb

## 2021-03-28 DIAGNOSIS — E559 Vitamin D deficiency, unspecified: Secondary | ICD-10-CM

## 2021-03-28 DIAGNOSIS — J45909 Unspecified asthma, uncomplicated: Secondary | ICD-10-CM

## 2021-03-28 DIAGNOSIS — R7309 Other abnormal glucose: Secondary | ICD-10-CM

## 2021-03-28 DIAGNOSIS — Z79899 Other long term (current) drug therapy: Secondary | ICD-10-CM

## 2021-03-28 DIAGNOSIS — E782 Mixed hyperlipidemia: Secondary | ICD-10-CM

## 2021-03-28 DIAGNOSIS — K219 Gastro-esophageal reflux disease without esophagitis: Secondary | ICD-10-CM

## 2021-03-28 DIAGNOSIS — I10 Essential (primary) hypertension: Secondary | ICD-10-CM

## 2021-03-28 DIAGNOSIS — E669 Obesity, unspecified: Secondary | ICD-10-CM

## 2021-03-28 LAB — CBC WITH DIFFERENTIAL/PLATELET
Absolute Monocytes: 518 cells/uL (ref 200–950)
Basophils Absolute: 92 cells/uL (ref 0–200)
Basophils Relative: 1.3 %
Eosinophils Absolute: 220 cells/uL (ref 15–500)
Eosinophils Relative: 3.1 %
HCT: 41.6 % (ref 35.0–45.0)
Hemoglobin: 13.8 g/dL (ref 11.7–15.5)
Lymphs Abs: 1931 cells/uL (ref 850–3900)
MCH: 30.8 pg (ref 27.0–33.0)
MCHC: 33.2 g/dL (ref 32.0–36.0)
MCV: 92.9 fL (ref 80.0–100.0)
MPV: 11.1 fL (ref 7.5–12.5)
Monocytes Relative: 7.3 %
Neutro Abs: 4338 cells/uL (ref 1500–7800)
Neutrophils Relative %: 61.1 %
Platelets: 219 10*3/uL (ref 140–400)
RBC: 4.48 10*6/uL (ref 3.80–5.10)
RDW: 12.4 % (ref 11.0–15.0)
Total Lymphocyte: 27.2 %
WBC: 7.1 10*3/uL (ref 3.8–10.8)

## 2021-03-28 LAB — TSH: TSH: 4.61 mIU/L — ABNORMAL HIGH (ref 0.40–4.50)

## 2021-03-28 LAB — COMPLETE METABOLIC PANEL WITH GFR
AG Ratio: 2.5 (calc) (ref 1.0–2.5)
ALT: 19 U/L (ref 6–29)
AST: 22 U/L (ref 10–35)
Albumin: 4.8 g/dL (ref 3.6–5.1)
Alkaline phosphatase (APISO): 67 U/L (ref 37–153)
BUN: 21 mg/dL (ref 7–25)
CO2: 31 mmol/L (ref 20–32)
Calcium: 10.1 mg/dL (ref 8.6–10.4)
Chloride: 104 mmol/L (ref 98–110)
Creat: 0.99 mg/dL (ref 0.50–1.05)
Globulin: 1.9 g/dL (calc) (ref 1.9–3.7)
Glucose, Bld: 86 mg/dL (ref 65–99)
Potassium: 4.2 mmol/L (ref 3.5–5.3)
Sodium: 139 mmol/L (ref 135–146)
Total Bilirubin: 0.4 mg/dL (ref 0.2–1.2)
Total Protein: 6.7 g/dL (ref 6.1–8.1)
eGFR: 65 mL/min/{1.73_m2} (ref >=60)

## 2021-03-28 LAB — LIPID PANEL
Cholesterol: 181 mg/dL (ref <200)
HDL: 50 mg/dL (ref >=50)
LDL Cholesterol (Calc): 95 mg/dL (calc)
Non-HDL Cholesterol (Calc): 131 mg/dL (calc) — ABNORMAL HIGH (ref <130)
Total CHOL/HDL Ratio: 3.6 (calc) (ref <5.0)
Triglycerides: 235 mg/dL — ABNORMAL HIGH (ref <150)

## 2021-03-28 LAB — MAGNESIUM: Magnesium: 2.3 mg/dL (ref 1.5–2.5)

## 2021-03-28 MED ORDER — PRAVASTATIN SODIUM 40 MG PO TABS: ORAL_TABLET | ORAL | 3 refills | Status: DC

## 2021-03-29 ENCOUNTER — Encounter: Payer: Self-pay | Admitting: Adult Health

## 2021-03-29 DIAGNOSIS — R7989 Other specified abnormal findings of blood chemistry: Secondary | ICD-10-CM | POA: Insufficient documentation

## 2021-04-21 ENCOUNTER — Ambulatory Visit: Payer: BC Managed Care – PPO | Admitting: Allergy and Immunology

## 2021-04-21 ENCOUNTER — Encounter: Payer: Self-pay | Admitting: Allergy and Immunology

## 2021-04-21 ENCOUNTER — Other Ambulatory Visit: Payer: Self-pay

## 2021-04-21 VITALS — BP 140/82 | HR 68 | Resp 16

## 2021-04-21 DIAGNOSIS — J4540 Moderate persistent asthma, uncomplicated: Secondary | ICD-10-CM | POA: Diagnosis not present

## 2021-04-21 DIAGNOSIS — K219 Gastro-esophageal reflux disease without esophagitis: Secondary | ICD-10-CM

## 2021-04-21 DIAGNOSIS — J301 Allergic rhinitis due to pollen: Secondary | ICD-10-CM

## 2021-04-21 NOTE — Progress Notes (Signed)
[facility]   Follow-up Note  Referring Provider: Unk Pinto, MD Primary Provider: Unk Pinto, MD Date of Office Visit: 04/21/2021  Subjective:   Rhonda [name] (DOB: Jan 10, 1961) is a 60 y.o. female who returns to the [facility] on 04/21/2021 in re-evaluation of the following:  HPI: Rhonda [name] presents to this clinic in reevaluation of asthma and allergic rhinoconjunctivitis and LPR.  Her last visit to this clinic was 27 January 2021.  Overall she has had a very good interval of time regarding her airway issue.  She has very little coughing at this point.  She has no issues with throat clearing or postnasal drip except possibly 1 time per week when she has to clear out her throat.  She has very little problems with her nose.  She rarely uses a short acting bronchodilator.  She can exert herself by walking twice a week with no problem at all.  She continues on a triple inhaler and montelukast daily and uses a nasal steroid about 1 or 2 times per week and also continues to aggressively treat her reflux.  She does not receive the COVID-vaccine and does not receive the flu vaccine.  Allergies as of 04/21/2021       Reactions   Ace Inhibitors    cough   Azithromycin    rash   Prednisone    High dose   Vicodin [hydrocodone-acetaminophen]    Nausea/vomiting        Medication List    albuterol 108 (90 Base) MCG/ACT inhaler Commonly known as: VENTOLIN HFA Inhale into the lungs every 6 (six) hours as needed for wheezing or shortness of breath.   aspirin 81 MG tablet Take 81 mg by mouth daily.   bisoprolol-hydrochlorothiazide 5-6.25 MG tablet Commonly known as: ZIAC TAKE 1 TABLET BY MOUTH EVERY DAY FOR BLOOD PRESSURE   levocetirizine 5 MG tablet Commonly known as: XYZAL 1 tablet 1-2 times a day as needed   meloxicam 15 MG tablet Commonly known as: MOBIC Take 1/2 to 1 tablet Daily with Food for Pain &  Inflammation   montelukast 10 MG tablet Commonly known as: SINGULAIR Take 1 tablet (10 mg total) by mouth at bedtime.   olmesartan 40 MG tablet Commonly known as: BENICAR Take  1 tablet  every night for BP   pantoprazole 40 MG tablet Commonly known as: PROTONIX Take one tablet twice daily   pravastatin 40 MG tablet Commonly known as: PRAVACHOL Take 1 tab daily for cholesterol.   Trelegy Ellipta 200-62.5-25 MCG/INH Aepb Generic drug: Fluticasone-Umeclidin-Vilant 1 inhalation 1 time per day. Rinse mouth after use.   VITAMIN D PO Take 4,000 Units by mouth daily.    Past Medical History:  Diagnosis Date   Allergy    Asthma    Eczema    History of COVID-19 03/01/2020   [facility]-01/14/20    Hyperlipidemia    Hypertension    Migraine    NSAID induced gastritis 03/01/2020   Obesity    Prediabetes    Vitamin D deficiency     Past Surgical History:  Procedure Laterality Date   DILATION AND CURETTAGE, DIAGNOSTIC / THERAPEUTIC  2009   ENDOMETRIAL ABLATION     LYMPH NODE BIOPSY Left 1991   negative   NEVUS EXCISION  2012   dysplastic from back    Review of systems negative except as noted in HPI / PMHx or noted below:  Review  of Systems  Constitutional: Negative.   HENT: Negative.    Eyes: Negative.   Respiratory: Negative.    Cardiovascular: Negative.   Gastrointestinal: Negative.   Genitourinary: Negative.   Musculoskeletal: Negative.   Skin: Negative.   Neurological: Negative.   Endo/Heme/Allergies: Negative.   Psychiatric/Behavioral: Negative.      Objective:   Vitals:   04/21/21 0844  BP: 140/82  Pulse: 68  Resp: 16  SpO2: 94%          Physical Exam Constitutional:      Appearance: She is not diaphoretic.  HENT:     Head: Normocephalic.     Right Ear: Tympanic membrane, ear canal and external ear normal.     Left Ear: Tympanic membrane, ear canal and external ear normal.     Nose: Nose normal. No mucosal edema or  rhinorrhea.     Mouth/Throat:     Pharynx: Uvula midline. No oropharyngeal exudate.  Eyes:     Conjunctiva/sclera: Conjunctivae normal.  Neck:     Thyroid: No thyromegaly.     Trachea: Trachea normal. No tracheal tenderness or tracheal deviation.  Cardiovascular:     Rate and Rhythm: Normal rate and regular rhythm.     Heart sounds: Normal heart sounds, S1 normal and S2 normal. No murmur heard. Pulmonary:     Effort: No respiratory distress.     Breath sounds: Normal breath sounds. No stridor. No wheezing or rales.  Lymphadenopathy:     Head:     Right side of head: No tonsillar adenopathy.     Left side of head: No tonsillar adenopathy.     Cervical: No cervical adenopathy.  Skin:    Findings: No erythema or rash.     Nails: There is no clubbing.  Neurological:     Mental Status: She is alert.    Diagnostics:    Spirometry was performed and demonstrated an FEV1 of 3.08 at 126 % of predicted.  Assessment and Plan:   1. Asthma, moderate persistent, well-controlled   2. Seasonal allergic rhinitis due to pollen   3. LPRD (laryngopharyngeal reflux disease)     1.  Allergen avoidance measures - pollens  2.  Treat and prevent inflammation:   A. Montelukast 10 mg - 1 tablet 1 time per day  B. Trelegy 200 - 1 inhalation 1 time per day   C. Nasacort - 1 spray each nostril 1-7 times per week  3. Treat and prevent LPR:   A. Pantoprazole 40 mg - 1 tablet 1-2 times a day   4. If needed:   A. Xyzal 5 mg - 1 tablet 1-2 times per day  B. Albuterol HFA - 2 inhalations every 4-6 hours  5. Return to clinic in 6 months or earlier if problem  Rhonda [name] appears to be doing quite well on her current plan of anti-inflammatory agents for her airway and therapy directed against reflux.  She has a pretty good idea of her disease state and appropriate dosing of her medications depending on the activity of her disease state.  We will let her make a decision about what dose of Nasacort and what  dose of pantoprazole is required to control her respiratory tract issues while she consistently uses montelukast and a triple inhaler.  Assuming she does well with this plan I will see her back in this clinic in 6 months or earlier if there is a problem.  Allena Katz, MD Allergy / Immunology [place]

## 2021-04-21 NOTE — Patient Instructions (Signed)
  1.  Allergen avoidance measures - pollens  2.  Treat and prevent inflammation:   A. Montelukast 10 mg - 1 tablet 1 time per day  B. Trelegy 200 - 1 inhalation 1 time per day   C. Nasacort - 1 spray each nostril 1-7 times per week  3. Treat and prevent LPR:   A. Pantoprazole 40 mg - 1 tablet 1-2 times a day   4. If needed:   A. Xyzal 5 mg - 1 tablet 1-2 times per day  B. Albuterol HFA - 2 inhalations every 4-6 hours  5. Return to clinic in 6 months or earlier if problem

## 2021-04-25 ENCOUNTER — Encounter: Payer: Self-pay | Admitting: Allergy and Immunology

## 2021-05-03 ENCOUNTER — Other Ambulatory Visit: Payer: Self-pay | Admitting: Internal Medicine

## 2021-05-03 DIAGNOSIS — Z1231 Encounter for screening mammogram for malignant neoplasm of breast: Secondary | ICD-10-CM

## 2021-06-01 DIAGNOSIS — M19041 Primary osteoarthritis, right hand: Secondary | ICD-10-CM | POA: Diagnosis not present

## 2021-06-01 DIAGNOSIS — M19042 Primary osteoarthritis, left hand: Secondary | ICD-10-CM | POA: Diagnosis not present

## 2021-06-01 DIAGNOSIS — M180 Bilateral primary osteoarthritis of first carpometacarpal joints: Secondary | ICD-10-CM | POA: Diagnosis not present

## 2021-06-07 DIAGNOSIS — M180 Bilateral primary osteoarthritis of first carpometacarpal joints: Secondary | ICD-10-CM | POA: Diagnosis not present

## 2021-06-07 DIAGNOSIS — M19042 Primary osteoarthritis, left hand: Secondary | ICD-10-CM | POA: Diagnosis not present

## 2021-06-07 DIAGNOSIS — M19041 Primary osteoarthritis, right hand: Secondary | ICD-10-CM | POA: Diagnosis not present

## 2021-06-13 ENCOUNTER — Other Ambulatory Visit: Payer: Self-pay

## 2021-06-13 ENCOUNTER — Ambulatory Visit
Admission: RE | Admit: 2021-06-13 | Discharge: 2021-06-13 | Disposition: A | Discharge status: Home / Self Care | Payer: BC Managed Care – PPO | Source: Ambulatory Visit | Attending: Internal Medicine | Admitting: Internal Medicine

## 2021-06-13 DIAGNOSIS — Z1231 Encounter for screening mammogram for malignant neoplasm of breast: Secondary | ICD-10-CM | POA: Diagnosis not present

## 2021-06-15 ENCOUNTER — Other Ambulatory Visit: Payer: Self-pay | Admitting: Internal Medicine

## 2021-06-15 DIAGNOSIS — R928 Other abnormal and inconclusive findings on diagnostic imaging of breast: Secondary | ICD-10-CM

## 2021-07-22 ENCOUNTER — Other Ambulatory Visit: Payer: Self-pay | Admitting: Allergy and Immunology

## 2021-08-02 NOTE — Progress Notes (Signed)
FOLLOW UP  Assessment and Plan:   Asthma Well controlled on current regimen Continue meds, avoid triggers  Hypertension Improved on recheck Monitor blood pressure at home; patient to call if consistently greater than 130/80 Continue DASH diet.   Reminder to go to the ER if any CP, SOB, nausea, dizziness, severe HA, changes vision/speech, left arm numbness and tingling and jaw pain.  Cholesterol continue statin therapy; working on lifestyle Continue low cholesterol diet and exercise; reduce saturated fat Check lipid panel.   Other abnormal glucose Recent A1Cs at goal Discussed diet/exercise, weight management  Defer A1C; check CMP  Morbid obesity (HCC) - BMI 35 with htn, hld Long discussion about weight loss, diet, and exercise Recommended diet heavy in fruits and veggies and low in animal meats, cheeses, and dairy products, appropriate calorie intake Patient will work on reducing stress eating, portion control, clean eating (has done well with next 56 days diet, plans to restart) Will follow up in 3 months  Vitamin D Def  At goal at last visit; continue supplementation to maintain goal of 60-100  GERD/ Hx of NSAID induced gastritis Well managed on current medications  Using celebrex sparingly without concerning sx Discussed diet, avoiding triggers and other lifestyle changes  Allergies/cough Follows with allergist; encouraged she try nasal saline irrigation followed by topical steroid at night x 2 weeks to evaluate benefit for secretions/AM cough  Abnormal TSH First mild abnormal at last check; denies hypothyroid sx check TSH level    Continue diet and meds as discussed. Further disposition pending results of labs. Discussed med's effects and SE's.   Over 30 minutes of exam, counseling, chart review, and critical decision making was performed.   Future Appointments  Date Time Provider [place]  08/05/2021  8:30 AM [facility] [place] 2 [facility]   08/05/2021  8:40 AM [facility] [place] 2 [facility]  10/19/2021  8:30 AM Kozlow, Donnamarie Poag, MD [facility] None  12/21/2021  3:00 PM Unk Pinto, MD [facility] None    ----------------------------------------------------------------------------------------------------------------------  HPI 60 y.o. female  presents for 3 month follow up on hypertension, cholesterol, glucose management, obesity and vitamin D deficiency.   She has asthma currently well controlled on Symbicort and Singulair. She uses albuterol rarely. Allergies, follows recently with Dr. Tina Griffiths. Has had persistent dry cough, secretions in AM. Taking xyxal. Has nasocort but doesn't like to use.   she has a diagnosis of NSAID gastritis (per 2017 EGD) which is currently managed by protonix 40 mg daily.  She has bilateral thumb OA and is seeing Dr. Fredna Dow, she is on celebrex PRN at this time. Her pain is somewhat improved and monitoring for now, wants to avoid surgery.   BMI is Body mass index is 34.67 kg/m., she has been working on diet and exercise. She plans to getting back on the next 56 days diet (has worked well for her in the past), goal to get back down to 145 lb. She is making better choices, just eating too much.  Wt Readings from Last 3 Encounters:  08/03/21 202 lb (91.6 kg)  03/28/21 205 lb (93 kg)  03/01/21 200 lb (90.7 kg)    She is on cholesterol medication (pravastatin 40 mg daily) and denies myalgias. Her cholesterol is at goal. The cholesterol last visit was:   Lab Results  Component Value Date   CHOL 181 03/28/2021   HDL 50 03/28/2021   LDLCALC 95 03/28/2021   TRIG 235 (H) 03/28/2021   CHOLHDL 3.6 03/28/2021  She has been working on diet and exercise for glucose management, and denies foot ulcerations, increased appetite, nausea, paresthesia of the feet, polydipsia, polyuria, visual disturbances, vomiting and weight loss. Last A1C in the office was:  Lab Results  Component Value Date   HGBA1C 5.3  12/21/2020   She had new mild hypothyroid at last visit, denies fatigue, dry skin, hair loss, constipation.  Lab Results  Component Value Date   TSH 4.61 (H) 03/28/2021   Last GFR:  Lab Results  Component Value Date   GFRNONAA 71 01/24/2021   Patient is on Vitamin D supplement and at goal at recent check:    Lab Results  Component Value Date   VD25OH 95 12/21/2020       Current Medications:  Current Outpatient Medications on File Prior to Visit  Medication Sig   albuterol (PROVENTIL HFA;VENTOLIN HFA) 108 (90 BASE) MCG/ACT inhaler Inhale into the lungs every 6 (six) hours as needed for wheezing or shortness of breath.   aspirin 81 MG tablet Take 81 mg by mouth daily.   bisoprolol-hydrochlorothiazide (ZIAC) 5-6.25 MG tablet TAKE 1 TABLET BY MOUTH EVERY DAY FOR BLOOD PRESSURE   Cholecalciferol (VITAMIN D PO) Take 4,000 Units by mouth daily.    Fluticasone-Umeclidin-Vilant (TRELEGY ELLIPTA) 200-62.5-25 MCG/ACT AEPB INHALE 1 PUFF BY MOUTH EVERY DAY. RINSE MOUTH AFTER USE   levocetirizine (XYZAL) 5 MG tablet 1 tablet 1-2 times a day as needed   meloxicam (MOBIC) 15 MG tablet Take 1/2 to 1 tablet Daily with Food for Pain & Inflammation   montelukast (SINGULAIR) 10 MG tablet TAKE ONE TABLET BY MOUTH AT BEDTIME   olmesartan (BENICAR) 40 MG tablet Take  1 tablet  every night for BP   pravastatin (PRAVACHOL) 40 MG tablet Take 1 tab daily for cholesterol.   No current facility-administered medications on file prior to visit.     Allergies:  Allergies  Allergen Reactions   Ace Inhibitors     cough   Azithromycin     rash   Prednisone     High dose   Vicodin [Hydrocodone-Acetaminophen]     Nausea/vomiting     Medical History:  Past Medical History:  Diagnosis Date   Allergy    Asthma    Eczema    History of COVID-19 03/01/2020   [facility]-01/14/20    Hyperlipidemia    Hypertension    Migraine    NSAID induced gastritis 03/01/2020   Obesity     Prediabetes    Vitamin D deficiency    Family history- Reviewed and unchanged Social history- Reviewed and unchanged   Review of Systems:  Review of Systems  Constitutional:  Negative for malaise/fatigue and weight loss.  HENT:  Positive for congestion. Negative for hearing loss, sinus pain and tinnitus.   Eyes:  Negative for blurred vision and double vision.  Respiratory:  Positive for cough (AM, productive, chronic with allergies, stable). Negative for shortness of breath and wheezing.   Cardiovascular:  Negative for chest pain, palpitations, orthopnea, claudication and leg swelling.  Gastrointestinal:  Negative for abdominal pain, blood in stool, constipation, diarrhea, heartburn, melena, nausea and vomiting.  Genitourinary: Negative.   Musculoskeletal:  Positive for joint pain (bil thumbs). Negative for falls, myalgias and neck pain.  Skin:  Negative for rash.  Neurological:  Negative for dizziness, tingling, sensory change, weakness and headaches.  Endo/Heme/Allergies:  Positive for environmental allergies. Negative for polydipsia.  Psychiatric/Behavioral: Negative.    All other systems reviewed and are  negative.  Physical Exam: BP 138/80    Pulse 73    Temp 97.7 F (36.5 C)    Wt 202 lb (91.6 kg)    SpO2 97%    BMI 34.67 kg/m  Wt Readings from Last 3 Encounters:  08/03/21 202 lb (91.6 kg)  03/28/21 205 lb (93 kg)  03/01/21 200 lb (90.7 kg)   General Appearance: Well nourished, in no apparent distress. Eyes: PERRLA, EOMs, conjunctiva no swelling or erythema Sinuses: No Frontal/maxillary tenderness ENT/Mouth: Ext aud canals clear, TMs without erythema, bulging. No erythema, swelling, or exudate on post pharynx.  Tonsils not swollen or erythematous. Hearing normal. Mildly hoarse vocal quality.  Neck: Supple, thyroid normal.  Respiratory: Respiratory effort normal, BS equal bilaterally without rales, rhonchi, wheezing or stridor.  Cardio: RRR with no MRGs. Brisk peripheral  pulses without edema.  Abdomen: Soft, + BS.  Non-tender, no guarding, rebound, hernias, masses. Lymphatics: Non tender without lymphadenopathy.  Musculoskeletal: Full ROM without effusion, she does have DIP joint bony enlargement and mild deviations. Normal gait.   Skin: Warm, dry without rashes, lesions, ecchymosis.  Neuro: Cranial nerves intact. No cerebellar symptoms.  Psych: Awake and oriented X 3, normal affect, Insight and Judgment appropriate.    [name], NP 4:44 PM [facility]

## 2021-08-03 ENCOUNTER — Ambulatory Visit: Payer: BC Managed Care – PPO | Admitting: Adult Health

## 2021-08-03 ENCOUNTER — Encounter: Payer: Self-pay | Admitting: Adult Health

## 2021-08-03 ENCOUNTER — Other Ambulatory Visit: Payer: Self-pay

## 2021-08-03 VITALS — BP 138/80 | HR 73 | Temp 97.7°F | Wt 202.0 lb

## 2021-08-03 DIAGNOSIS — I10 Essential (primary) hypertension: Secondary | ICD-10-CM | POA: Diagnosis not present

## 2021-08-03 DIAGNOSIS — E782 Mixed hyperlipidemia: Secondary | ICD-10-CM

## 2021-08-03 DIAGNOSIS — E559 Vitamin D deficiency, unspecified: Secondary | ICD-10-CM

## 2021-08-03 DIAGNOSIS — R7989 Other specified abnormal findings of blood chemistry: Secondary | ICD-10-CM

## 2021-08-03 DIAGNOSIS — Z79899 Other long term (current) drug therapy: Secondary | ICD-10-CM | POA: Diagnosis not present

## 2021-08-03 DIAGNOSIS — E669 Obesity, unspecified: Secondary | ICD-10-CM

## 2021-08-03 DIAGNOSIS — J45909 Unspecified asthma, uncomplicated: Secondary | ICD-10-CM | POA: Diagnosis not present

## 2021-08-03 DIAGNOSIS — R7309 Other abnormal glucose: Secondary | ICD-10-CM

## 2021-08-03 DIAGNOSIS — K219 Gastro-esophageal reflux disease without esophagitis: Secondary | ICD-10-CM | POA: Diagnosis not present

## 2021-08-03 MED ORDER — PANTOPRAZOLE SODIUM 40 MG PO TBEC: DELAYED_RELEASE_TABLET | ORAL | 3 refills | Status: DC

## 2021-08-03 NOTE — Patient Instructions (Addendum)
° ° °  Please try taking 2 tabs of bisoprolol for 3-5 days, see how blood pressure/pulse looks and how you feel. Call to let me know if this helps - will send in higher dose    Do saline nasal irrigation followed by nasal steroid daily   Try switching xyzal to allegra or zyrtec for 1 month

## 2021-08-04 ENCOUNTER — Other Ambulatory Visit: Payer: Self-pay | Admitting: Adult Health

## 2021-08-04 DIAGNOSIS — R7989 Other specified abnormal findings of blood chemistry: Secondary | ICD-10-CM

## 2021-08-04 LAB — CBC WITH DIFFERENTIAL/PLATELET
Absolute Monocytes: 558 cells/uL (ref 200–950)
Basophils Absolute: 90 cells/uL (ref 0–200)
Basophils Relative: 1.1 %
Eosinophils Absolute: 180 cells/uL (ref 15–500)
Eosinophils Relative: 2.2 %
HCT: 42.5 % (ref 35.0–45.0)
Hemoglobin: 14.5 g/dL (ref 11.7–15.5)
Lymphs Abs: 1960 cells/uL (ref 850–3900)
MCH: 31.3 pg (ref 27.0–33.0)
MCHC: 34.1 g/dL (ref 32.0–36.0)
MCV: 91.8 fL (ref 80.0–100.0)
MPV: 11.1 fL (ref 7.5–12.5)
Monocytes Relative: 6.8 %
Neutro Abs: 5412 cells/uL (ref 1500–7800)
Neutrophils Relative %: 66 %
Platelets: 246 10*3/uL (ref 140–400)
RBC: 4.63 10*6/uL (ref 3.80–5.10)
RDW: 12.8 % (ref 11.0–15.0)
Total Lymphocyte: 23.9 %
WBC: 8.2 10*3/uL (ref 3.8–10.8)

## 2021-08-04 LAB — COMPLETE METABOLIC PANEL WITH GFR
AG Ratio: 2.4 (calc) (ref 1.0–2.5)
ALT: 30 U/L — ABNORMAL HIGH (ref 6–29)
AST: 26 U/L (ref 10–35)
Albumin: 4.7 g/dL (ref 3.6–5.1)
Alkaline phosphatase (APISO): 72 U/L (ref 37–153)
BUN: 23 mg/dL (ref 7–25)
CO2: 27 mmol/L (ref 20–32)
Calcium: 10.3 mg/dL (ref 8.6–10.4)
Chloride: 104 mmol/L (ref 98–110)
Creat: 0.93 mg/dL (ref 0.50–1.05)
Globulin: 2 g/dL (calc) (ref 1.9–3.7)
Glucose, Bld: 88 mg/dL (ref 65–99)
Potassium: 4.2 mmol/L (ref 3.5–5.3)
Sodium: 141 mmol/L (ref 135–146)
Total Bilirubin: 0.5 mg/dL (ref 0.2–1.2)
Total Protein: 6.7 g/dL (ref 6.1–8.1)
eGFR: 69 mL/min/{1.73_m2} (ref >=60)

## 2021-08-04 LAB — LIPID PANEL
Cholesterol: 177 mg/dL (ref <200)
HDL: 55 mg/dL (ref >=50)
LDL Cholesterol (Calc): 90 mg/dL (calc)
Non-HDL Cholesterol (Calc): 122 mg/dL (calc) (ref <130)
Total CHOL/HDL Ratio: 3.2 (calc) (ref <5.0)
Triglycerides: 232 mg/dL — ABNORMAL HIGH (ref <150)

## 2021-08-04 LAB — TSH: TSH: 3.97 mIU/L (ref 0.40–4.50)

## 2021-08-04 LAB — MAGNESIUM: Magnesium: 2.3 mg/dL (ref 1.5–2.5)

## 2021-08-05 ENCOUNTER — Ambulatory Visit: Admission: RE | Admit: 2021-08-05 | Payer: BC Managed Care – PPO | Source: Ambulatory Visit

## 2021-08-05 ENCOUNTER — Ambulatory Visit
Admission: RE | Admit: 2021-08-05 | Discharge: 2021-08-05 | Disposition: A | Discharge status: Home / Self Care | Payer: BC Managed Care – PPO | Source: Ambulatory Visit | Attending: Internal Medicine | Admitting: Internal Medicine

## 2021-08-05 DIAGNOSIS — R928 Other abnormal and inconclusive findings on diagnostic imaging of breast: Secondary | ICD-10-CM

## 2021-08-05 DIAGNOSIS — R922 Inconclusive mammogram: Secondary | ICD-10-CM | POA: Diagnosis not present

## 2021-08-05 DIAGNOSIS — N6489 Other specified disorders of breast: Secondary | ICD-10-CM | POA: Diagnosis not present

## 2021-09-12 ENCOUNTER — Telehealth: Payer: Self-pay

## 2021-09-12 ENCOUNTER — Other Ambulatory Visit: Payer: Self-pay

## 2021-09-12 ENCOUNTER — Other Ambulatory Visit: Payer: BC Managed Care – PPO

## 2021-09-12 DIAGNOSIS — R7989 Other specified abnormal findings of blood chemistry: Secondary | ICD-10-CM

## 2021-09-12 LAB — HEPATIC FUNCTION PANEL
AG Ratio: 2.2 (calc) (ref 1.0–2.5)
ALT: 20 U/L (ref 6–29)
AST: 21 U/L (ref 10–35)
Albumin: 4.7 g/dL (ref 3.6–5.1)
Alkaline phosphatase (APISO): 83 U/L (ref 37–153)
Bilirubin, Direct: 0.1 mg/dL (ref 0.0–0.2)
Globulin: 2.1 g/dL (calc) (ref 1.9–3.7)
Indirect Bilirubin: 0.3 mg/dL (calc) (ref 0.2–1.2)
Total Bilirubin: 0.4 mg/dL (ref 0.2–1.2)
Total Protein: 6.8 g/dL (ref 6.1–8.1)

## 2021-09-12 NOTE — Telephone Encounter (Signed)
Patient states the she was told to take 2 tablets of the bisoprolol instead of one. Has tried it but feels awful so she is back to taking just one tablet.

## 2021-09-19 ENCOUNTER — Other Ambulatory Visit: Payer: Self-pay | Admitting: Adult Health

## 2021-10-19 ENCOUNTER — Ambulatory Visit: Payer: BC Managed Care – PPO | Admitting: Allergy and Immunology

## 2021-10-19 ENCOUNTER — Other Ambulatory Visit: Payer: Self-pay

## 2021-10-19 ENCOUNTER — Encounter: Payer: Self-pay | Admitting: Allergy and Immunology

## 2021-10-19 VITALS — BP 148/78 | HR 68 | Resp 16 | Ht 62.8 in | Wt 203.0 lb

## 2021-10-19 DIAGNOSIS — K219 Gastro-esophageal reflux disease without esophagitis: Secondary | ICD-10-CM

## 2021-10-19 DIAGNOSIS — J4540 Moderate persistent asthma, uncomplicated: Secondary | ICD-10-CM | POA: Diagnosis not present

## 2021-10-19 DIAGNOSIS — J301 Allergic rhinitis due to pollen: Secondary | ICD-10-CM | POA: Diagnosis not present

## 2021-10-19 MED ORDER — FAMOTIDINE 40 MG PO TABS: 40.0000 mg | ORAL_TABLET | Freq: Every day | ORAL | 5 refills | Status: DC

## 2021-10-19 MED ORDER — ALBUTEROL SULFATE HFA 108 (90 BASE) MCG/ACT IN AERS: INHALATION_SPRAY | RESPIRATORY_TRACT | 1 refills | Status: DC

## 2021-10-19 NOTE — Progress Notes (Signed)
? ?[facility] ? ? ?Follow-up Note ? ?Referring Provider: Unk Pinto, MD ?Primary Provider: Unk Pinto, MD ?Date of Office Visit: 10/19/2021 ? ?Subjective:  ? ?Rhonda [name] (DOB: 06/28/1961) is a 61 y.o. female who returns to the [facility] on 10/19/2021 in re-evaluation of the following: ? ?HPI: Grey returns to this clinic in evaluation of asthma, allergic rhinoconjunctivitis, and LPR.  Her last visit to this clinic was 21 April 2021. ? ?Overall she has done very well with her asthma and rarely uses a short acting bronchodilator and can exert herself without any problem and has not required a systemic steroid to treat an exacerbation while she continues on a triple inhaler and montelukast. ? ?Her nose has also been doing quite well and she rarely uses a nasal steroid at this point in time.  It does not sound as if she has required an antibiotic to treat an episode of sinusitis. ? ?She has had a little bit more drainage in her throat and some throat clearing.  She uses pantoprazole to treat her LPR 1 time per day.  If she increase this to twice a day it does create some GI upset. ? ?Allergies as of 10/19/2021   ? ?   Reactions  ? Ace Inhibitors   ? cough  ? Azithromycin   ? rash  ? Prednisone   ? High dose  ? Vicodin [hydrocodone-acetaminophen]   ? Nausea/vomiting  ? ?  ? ?  ?Medication List  ? ? ?albuterol 108 (90 Base) MCG/ACT inhaler ?Commonly known as: VENTOLIN HFA ?Inhale into the lungs every 6 (six) hours as needed for wheezing or shortness of breath. ?  ?aspirin 81 MG tablet ?Take 81 mg by mouth daily. ?  ?bisoprolol-hydrochlorothiazide 5-6.25 MG tablet ?Commonly known as: ZIAC ?TAKE ONE TABLET BY MOUTH EVERY DAY FOR BLOOD PRESSURE ?  ?CALCIUM-MAGNESIUM-VITAMIN D PO ?Take by mouth daily. ?  ?celecoxib 100 MG capsule ?Commonly known as: CELEBREX ?Take 100 mg by mouth daily as needed. ?  ?levocetirizine 5 MG tablet ?Commonly known  as: XYZAL ?1 tablet 1-2 times a day as needed ?  ?montelukast 10 MG tablet ?Commonly known as: SINGULAIR ?TAKE ONE TABLET BY MOUTH AT BEDTIME ?  ?MULTIVITAMIN PO ?Take by mouth daily. ?  ?olmesartan 40 MG tablet ?Commonly known as: BENICAR ?Take  1 tablet  every night for BP ?  ?pantoprazole 40 MG tablet ?Commonly known as: PROTONIX ?Take one tablet daily ?  ?pravastatin 40 MG tablet ?Commonly known as: PRAVACHOL ?Take 1 tab daily for cholesterol. ?  ?Trelegy Ellipta 200-62.5-25 MCG/ACT Aepb ?Generic drug: Fluticasone-Umeclidin-Vilant ?INHALE 1 PUFF BY MOUTH EVERY DAY. RINSE MOUTH AFTER USE ?  ?VITAMIN D PO ?Take 4,000 Units by mouth daily. ?  ? ?Past Medical History:  ?Diagnosis Date  ? Allergy   ? Asthma   ? Eczema   ? History of COVID-19 03/01/2020  ? [facility]-01/14/20   ? Hyperlipidemia   ? Hypertension   ? Migraine   ? NSAID induced gastritis 03/01/2020  ? Obesity   ? Prediabetes   ? Vitamin D deficiency   ? ? ?Past Surgical History:  ?Procedure Laterality Date  ? DILATION AND CURETTAGE, DIAGNOSTIC / THERAPEUTIC  2009  ? ENDOMETRIAL ABLATION    ? LYMPH NODE BIOPSY Left 1991  ? negative  ? NEVUS EXCISION  2012  ? dysplastic from back  ? ? ?Review of systems negative except as  noted in HPI / PMHx or noted below: ? ?Review of Systems  ?Constitutional: Negative.   ?HENT: Negative.    ?Eyes: Negative.   ?Respiratory: Negative.    ?Cardiovascular: Negative.   ?Gastrointestinal: Negative.   ?Genitourinary: Negative.   ?Musculoskeletal: Negative.   ?Skin: Negative.   ?Neurological: Negative.   ?Endo/Heme/Allergies: Negative.   ?Psychiatric/Behavioral: Negative.    ? ? ?Objective:  ? ?Vitals:  ? 10/19/21 0842  ?BP: (!) 148/78  ?Pulse: 68  ?Resp: 16  ?SpO2: 95%  ? ?Height: 5' 2.8" (159.5 cm)  ?Weight: 203 lb (92.1 kg)  ? ?Physical Exam ?Constitutional:   ?   Appearance: She is not diaphoretic.  ?HENT:  ?   Head: Normocephalic.  ?   Right Ear: Tympanic membrane, ear canal and external ear normal.  ?    Left Ear: Tympanic membrane, ear canal and external ear normal.  ?   Nose: Nose normal. No mucosal edema or rhinorrhea.  ?   Mouth/Throat:  ?   Pharynx: Uvula midline. No oropharyngeal exudate.  ?Eyes:  ?   Conjunctiva/sclera: Conjunctivae normal.  ?Neck:  ?   Thyroid: No thyromegaly.  ?   Trachea: Trachea normal. No tracheal tenderness or tracheal deviation.  ?Cardiovascular:  ?   Rate and Rhythm: Normal rate and regular rhythm.  ?   Heart sounds: Normal heart sounds, S1 normal and S2 normal. No murmur heard. ?Pulmonary:  ?   Effort: No respiratory distress.  ?   Breath sounds: Normal breath sounds. No stridor. No wheezing or rales.  ?Lymphadenopathy:  ?   Head:  ?   Right side of head: No tonsillar adenopathy.  ?   Left side of head: No tonsillar adenopathy.  ?   Cervical: No cervical adenopathy.  ?Skin: ?   Findings: No erythema or rash.  ?   Nails: There is no clubbing.  ?Neurological:  ?   Mental Status: She is alert.  ? ? ?Diagnostics:  ?  ?Spirometry was performed and demonstrated an FEV1 of 2.50 at 106 % of predicted. ? ?Assessment and Plan:  ? ?1. Asthma, moderate persistent, well-controlled   ?2. Seasonal allergic rhinitis due to pollen   ?3. LPRD (laryngopharyngeal reflux disease)   ? ? ?1.  Allergen avoidance measures - pollens ? ?2.  Treat and prevent inflammation: ? ? A. Montelukast 10 mg - 1 tablet 1 time per day ? B. Trelegy 200 - 1 inhalation 1 time per day  ? ?3. Treat and prevent LPR: ? ? A. Pantoprazole 40 mg - 1 tablet in AM ? B. Famotidine 40 mg - 1 tablet in PM ? ?4. If needed: ? ? A. Xyzal 5 mg - 1 tablet 1-2 times per day ? B. Albuterol HFA - 2 inhalations every 4-6 hours ? C. Nasacort - 1 spray each nostril 1-7 times per week ? ?5. Return to clinic in 6 months or earlier if problem ? ?Jaylenn appears to be doing quite well regarding her asthma and her upper airway disease but her LPR seems a little bit active and she is intolerant of using pantoprazole twice a day because of GI upset.  We will  have her use a combination of pantoprazole and an H2 receptor blocker as noted above and we will see what happens over the course of the next month or 2 with this plan.  If she does well I will see her back in this clinic in 6 months or earlier if there is a problem. ? ?  Allena Katz, MD ?Allergy / Immunology ?[place] ?

## 2021-10-19 NOTE — Patient Instructions (Addendum)
?  1.  Allergen avoidance measures - pollens ? ?2.  Treat and prevent inflammation: ? ? A. Montelukast 10 mg - 1 tablet 1 time per day ? B. Trelegy 200 - 1 inhalation 1 time per day  ? ?3. Treat and prevent LPR: ? ? A. Pantoprazole 40 mg - 1 tablet in AM ? B. Famotidine 40 mg - 1 tablet in PM ? ?4. If needed: ? ? A. Xyzal 5 mg - 1 tablet 1-2 times per day ? B. Albuterol HFA - 2 inhalations every 4-6 hours ? C. Nasacort - 1 spray each nostril 1-7 times per week ? ?5. Return to clinic in 6 months or earlier if problem ?

## 2021-10-20 ENCOUNTER — Encounter: Payer: Self-pay | Admitting: Allergy and Immunology

## 2021-10-21 ENCOUNTER — Other Ambulatory Visit: Payer: Self-pay | Admitting: Allergy and Immunology

## 2021-10-25 ENCOUNTER — Other Ambulatory Visit: Payer: Self-pay | Admitting: Allergy and Immunology

## 2021-11-29 DIAGNOSIS — H524 Presbyopia: Secondary | ICD-10-CM | POA: Diagnosis not present

## 2021-11-30 DIAGNOSIS — M19041 Primary osteoarthritis, right hand: Secondary | ICD-10-CM | POA: Diagnosis not present

## 2021-11-30 DIAGNOSIS — M19042 Primary osteoarthritis, left hand: Secondary | ICD-10-CM | POA: Diagnosis not present

## 2021-12-20 ENCOUNTER — Other Ambulatory Visit: Payer: Self-pay | Admitting: Internal Medicine

## 2021-12-20 DIAGNOSIS — I10 Essential (primary) hypertension: Secondary | ICD-10-CM

## 2021-12-20 NOTE — Progress Notes (Signed)
Annual Screening/Preventative Visit &  Comprehensive Evaluation &  Examination  Future Appointments  Date Time Provider Department  12/21/2021  3:00 PM Unk Pinto, MD [facility]  04/24/2022  8:30 AM Kozlow, Donnamarie Poag, MD [facility]  12/27/2022  3:00 PM Unk Pinto, MD [facility]        This very nice 61 y.o. DWF  presents for a Screening /Preventative Visit & comprehensive evaluation and management of multiple medical co-morbidities.  Patient has been followed for HTN, HLD, Prediabetes  and Vitamin D Deficiency.  Patient's GERD is controlled w/Protonix.         HTN predates circa 2007. Patient's BP has been controlled at home and patient denies any cardiac symptoms as chest pain, palpitations, shortness of breath, dizziness or ankle swelling. Today's BP is at goal - 140/80.        Patient's hyperlipidemia is controlled with diet and Pravastatin. Patient denies myalgias or other medication SE's. Last lipids were at goal except elevated Trig's :  Lab Results  Component Value Date   CHOL 177 08/03/2021   HDL 55 08/03/2021   LDLCALC 90 08/03/2021   TRIG 232 (H) 08/03/2021   CHOLHDL 3.2 08/03/2021         Patient has  has Morbid obesity (BMI 32.6) and  prediabetes (A1c 5.7% w/ Insulin 37 /2012) and patient denies reactive hypoglycemic symptoms, visual blurring, diabetic polys or paresthesias. Last A1c was normal & at goal :  Lab Results  Component Value Date   HGBA1C 5.3 12/21/2020   Wt Readings from Last 3 Encounters:  12/21/21 199 lb 3.2 oz (90.4 kg)  10/19/21 203 lb (92.1 kg)  08/03/21 202 lb (91.6 kg)          Finally, patient has history of Vitamin D Deficiency ("22" /2008) and last Vitamin D was at goal :   Lab Results  Component Value Date   VD25OH 95 12/21/2020     Current Outpatient Medications on File Prior to Visit  Medication Sig   albuterol HFA inhaler Can inhale two puffs every four to six hours as needed for cough or wheeze.   aspirin 81 MG  tablet Take daily.   bisoprolol-hctz 5-6.25 MG tablet TAKE ONE TABLET  EVERY DAY    CALCIUM-MAGNESIUM-VIT D  Take daily.   celecoxib  100 MG capsule Take  daily as needed.   VITAMIN D  Take 4,000 Units  daily.    famotidine 40 MG tablet Take 1 tablet  at bedtime.   levocetirizine  5 MG tablet 1 tablet 1-2 times a day as needed   montelukast 10 MG tablet TAKE ONE TABLET  AT BEDTIME   Multiple Vitamin  Take  daily.   olmesartan  40 MG tablet TAKE ONE TABLET  AT BEDTIME   pantoprazole  40 MG tablet Take one tablet daily   pravastatin 40 MG tablet Take 1 tab daily for cholesterol.   TRELEGY  200-62.5-25  INHALE 1 PUFF EVERY DAY      Allergies  Allergen Reactions   Ace Inhibitors     cough   Azithromycin     rash   Prednisone     High dose   Vicodin [Hydrocodone-Acetaminophen]     Nausea/vomiting     Past Medical History:  Diagnosis Date   Allergy    Asthma    Eczema    History of COVID-19 03/01/2020   [facility]-01/14/20    Hyperlipidemia    Hypertension    Migraine  NSAID induced gastritis 03/01/2020   Obesity    Prediabetes    Vitamin D deficiency      Health Maintenance  Topic Date Due   COVID-19 Vaccine (1) Never done   HIV Screening  Never done   Zoster Vaccines- Shingrix (1 of 2) Never done   PAP SMEAR-Modifier  12/27/2012   INFLUENZA VACCINE  03/07/2022   MAMMOGRAM  06/14/2023   TETANUS/TDAP  08/27/2028   Hepatitis C Screening  Completed   HPV VACCINES  Aged Out     Immunization History  Administered Date(s) Administered   PPD Test 08/03/2017, 08/27/2018, 12/21/2020   Pneumococcal-23 08/29/2009   Tdap 08/30/2007, 08/27/2018    Last Colon - 03/2016 Dr Melina Copa - recc 5 yr f/u - due Aug 2022 - Patient is currently scheduling with Dr Carmie End office.   Last MGM - 08/05/2021   Past Surgical History:  Procedure Laterality Date   DILATION AND CURETTAGE, DIAGNOSTIC / THERAPEUTIC  2009   ENDOMETRIAL ABLATION     LYMPH NODE BIOPSY  Left 1991   negative   NEVUS EXCISION  2012   dysplastic from back     Family History  Problem Relation Age of Onset   Hypertension Mother    COPD Father    Cancer Father        colon   Heart disease Father    Hyperlipidemia Father    Hypertension Father    Stroke Father    Allergic rhinitis Neg Hx    Angioedema Neg Hx    Asthma Neg Hx    Atopy Neg Hx    Eczema Neg Hx    Immunodeficiency Neg Hx    Urticaria Neg Hx      Social History   Tobacco Use   Smoking status: Never   Smokeless tobacco: Never  Substance Use Topics   Alcohol use: No   Drug use: No      ROS Constitutional: Denies fever, chills, weight loss/gain, headaches, insomnia,  night sweats, and change in appetite. Does c/o fatigue. Eyes: Denies redness, blurred vision, diplopia, discharge, itchy, watery eyes.  ENT: Denies discharge, congestion, post nasal drip, epistaxis, sore throat, earache, hearing loss, dental pain, Tinnitus, Vertigo, Sinus pain, snoring.  Cardio: Denies chest pain, palpitations, irregular heartbeat, syncope, dyspnea, diaphoresis, orthopnea, PND, claudication, edema Respiratory: denies cough, dyspnea, DOE, pleurisy, hoarseness, laryngitis, wheezing.  Gastrointestinal: Denies dysphagia, heartburn, reflux, water brash, pain, cramps, nausea, vomiting, bloating, diarrhea, constipation, hematemesis, melena, hematochezia, jaundice, hemorrhoids Genitourinary: Denies dysuria, frequency, urgency, nocturia, hesitancy, discharge, hematuria, flank pain Breast: Breast lumps, nipple discharge, bleeding.  Musculoskeletal: Denies arthralgia, myalgia, stiffness, Jt. Swelling, pain, limp, and strain/sprain. Denies falls. Skin: Denies puritis, rash, hives, warts, acne, eczema, changing in skin lesion Neuro: No weakness, tremor, incoordination, spasms, paresthesia, pain Psychiatric: Denies confusion, memory loss, sensory loss. Denies Depression. Endocrine: Denies change in weight, skin, hair change,  nocturia, and paresthesia, diabetic polys, visual blurring, hyper / hypo glycemic episodes.  Heme/Lymph: No excessive bleeding, bruising, enlarged lymph nodes.  Physical Exam  BP 140/80   Pulse 67   Temp (!) 97.5 F (36.4 C)   Ht 5' 3.25" (1.607 m)   Wt 199 lb 3.2 oz (90.4 kg)   SpO2 95%   BMI 35.01 kg/m   General Appearance: Well nourished, well groomed and in no apparent distress.  Eyes: PERRLA, EOMs, conjunctiva no swelling or erythema, normal fundi and vessels. Sinuses: No frontal/maxillary tenderness ENT/Mouth: EACs patent / TMs  nl. Nares clear without erythema, swelling,  mucoid exudates. Oral hygiene is good. No erythema, swelling, or exudate. Tongue normal, non-obstructing. Tonsils not swollen or erythematous. Hearing normal.  Neck: Supple, thyroid not palpable. No bruits, nodes or JVD. Respiratory: Respiratory effort normal.  BS equal and clear bilateral without rales, rhonci, wheezing or stridor. Cardio: Heart sounds are normal with regular rate and rhythm and no murmurs, rubs or gallops. Peripheral pulses are normal and equal bilaterally without edema. No aortic or femoral bruits. Chest: symmetric with normal excursions and percussion. Breasts: Symmetric, without lumps, nipple discharge, retractions, or fibrocystic changes.  Abdomen: Flat, soft with bowel sounds active. Nontender, no guarding, rebound, hernias, masses, or organomegaly.  Lymphatics: Non tender without lymphadenopathy.  Genitourinary:  Musculoskeletal: Full ROM all peripheral extremities, joint stability, 5/5 strength, and normal gait. Skin: Warm and dry without rashes, lesions, cyanosis, clubbing or  ecchymosis.  Neuro: Cranial nerves intact, reflexes equal bilaterally. Normal muscle tone, no cerebellar symptoms. Sensation intact.  Pysch: Alert and oriented X 3, normal affect, Insight and Judgment appropriate.    Assessment and Plan  1. Annual Preventative Screening Examination  2. Essential  hypertension  - EKG 12-Lead - [place], RETROPERITNL ABD,  LTD - Urinalysis, Routine w reflex microscopic - Microalbumin / creatinine urine ratio - CBC with Differential/Platelet - COMPLETE METABOLIC PANEL WITH GFR - Magnesium - TSH  3. Hyperlipidemia, mixed  - EKG 12-Lead - [place], RETROPERITNL ABD,  LTD - Lipid panel  4. Abnormal glucose  - EKG 12-Lead - [place], RETROPERITNL ABD,  LTD - Hemoglobin A1c - Insulin, random  5. Vitamin D deficiency  - VITAMIN D 25 Hydroxy   6. Obesity (BMI 30.0-34.9)  - TSH  7. Screening for colorectal cancer  - POC Hemoccult Bld/Stl   8. Screening-pulmonary TB  - TB Skin Test  9. Screening for heart disease  - EKG 12-Lead  10. FHx: heart disease  - EKG 12-Lead - [place], RETROPERITNL ABD,  LTD  11. Screening for AAA (aortic abdominal aneurysm)  - [place], RETROPERITNL ABD,  LTD  12. Fatigue, unspecified type  - Iron, Total/Total Iron Binding Cap - Vitamin B12 - CBC with Differential/Platelet - TSH  13. Medication management  - Urinalysis, Routine w reflex microscopic - Microalbumin / creatinine urine ratio - CBC with Differential/Platelet - COMPLETE METABOLIC PANEL WITH GFR - Magnesium - Lipid panel - TSH - Hemoglobin A1c - Insulin, random - VITAMIN D 25 Hydroxy           Patient was counseled in prudent diet to achieve/maintain BMI less than 25 for weight control, BP monitoring, regular exercise and medications. Discussed med's effects and SE's. Screening labs and tests as requested with regular follow-up as recommended. Over 40 minutes of exam, counseling, chart review and high complex critical decision making was performed.   Kirtland Bouchard, MD

## 2021-12-20 NOTE — Patient Instructions (Signed)
Due to recent changes in healthcare laws, you may see the results of your imaging and laboratory studies on MyChart before your provider has had a chance to review them.  We understand that in some cases there may be results that are confusing or concerning to you. Not all laboratory results come back in the same time frame and the provider may be waiting for multiple results in order to interpret others.  Please give [place] 48 hours in order for your provider to thoroughly review all the results before contacting the office for clarification of your results.  ? ?++++++++++++++++++++++++++++++ ? Vit D  & ?Vit C 1,000 mg   ?are recommended to help protect  ?against the Covid-19 and other Corona viruses.  ? ? Also it's recommended  ?to take  ?Zinc 50 mg  ?to help  ?protect against the Covid-19   ?and best place to get ? is also on [employer]  ?and don't pay more than 6-8 cents /pill !  ?================================ ?Coronavirus (COVID-19) Are you at risk? ? ?Are you at risk for the Coronavirus (COVID-19)? ? ?To be considered HIGH RISK for Coronavirus (COVID-19), you have to meet the following criteria: ? ?Traveled to [place], [place], [place], [place] or [place]; or in the [place] to [place], [place], [place]  ?or [place]; and have fever, cough, and shortness of breath within the last 2 weeks of travel OR ?Been in close contact with a person diagnosed with COVID-19 within the last 2 weeks and have  ?fever, cough,and shortness of breath ? ?IF YOU DO NOT MEET THESE CRITERIA, YOU ARE CONSIDERED LOW RISK FOR COVID-19. ? ?What to do if you are HIGH RISK for COVID-19? ? ?If you are having a medical emergency, call 911. ?Seek medical care right away. Before you go to a doctor?s office, urgent care or emergency department, ? call ahead and tell them about your recent travel, contact with someone diagnosed with COVID-19  ? and your symptoms.  ?You should receive instructions from your physician?s office regarding  next steps of care.  ?When you arrive at healthcare provider, tell the healthcare staff immediately you have returned from  ?visiting [place], [place], [place], [place] or [place]; or traveled in the [place] to [place], [place],  ?[place] or [place] in the last two weeks or you have been in close contact with a person diagnosed with  ?COVID-19 in the last 2 weeks.   ?Tell the health care staff about your symptoms: fever, cough and shortness of breath. ?After you have been seen by a medical provider, you will be either: ?Tested for (COVID-19) and discharged home on quarantine except to seek medical care if  ?symptoms worsen, and asked to  ?Stay home and avoid contact with others until you get your results (4-5 days)  ?Avoid travel on public transportation if possible (such as bus, train, or airplane) or ?Sent to the Emergency Department by EMS for evaluation, COVID-19 testing  and  ?possible admission depending on your condition and test results. ? ?What to do if you are LOW RISK for COVID-19? ? ?Reduce your risk of any infection by using the same precautions used for avoiding the common cold or flu:  ?Wash your hands often with soap and warm water for at least 20 seconds.  If soap and water are not readily available,  ?use an alcohol-based hand sanitizer with at least 60% alcohol.  ?If coughing or sneezing, cover your mouth and nose by coughing  or sneezing into the elbow areas of your shirt or coat, ? into a tissue or into your sleeve (not your hands). ?Avoid shaking hands with others and consider head nods or verbal greetings only. ?Avoid touching your eyes, nose, or mouth with unwashed hands.  ?Avoid close contact with people who are sick. ?Avoid places or events with large numbers of people in one location, like concerts or sporting events. ?Carefully consider travel plans you have or are making. ?If you are planning any travel outside or inside the [place], visit the CDC?s Travelers? Health webpage for  the latest health notices. ?If you have some symptoms but not all symptoms, continue to monitor at home and seek medical attention  ?if your symptoms worsen. ?If you are having a medical emergency, call 911. ?>>>>>>>>>>>>>>>>>>>>>>> ?Preventive Care for Adults ? ?A healthy lifestyle and preventive care can promote health and wellness. Preventive health guidelines for women include the following key practices. ?A routine yearly physical is a good way to check with your health care provider about your health and preventive screening. It is a chance to share any concerns and updates on your health and to receive a thorough exam. ?Visit your dentist for a routine exam and preventive care every 6 months. Brush your teeth twice a day and floss once a day. Good oral hygiene prevents tooth decay and gum disease. ?The frequency of eye exams is based on your age, health, family medical history, use of contact lenses, and other factors. Follow your health care provider's recommendations for frequency of eye exams. ?Eat a healthy diet. Foods like vegetables, fruits, whole grains, low-fat dairy products, and lean protein foods contain the nutrients you need without too many calories. Decrease your intake of foods high in solid fats, added sugars, and salt. Eat the right amount of calories for you. Get information about a proper diet from your health care provider, if necessary. ?Regular physical exercise is one of the most important things you can do for your health. Most adults should get at least 150 minutes of moderate-intensity exercise (any activity that increases your heart rate and causes you to sweat) each week. In addition, most adults need muscle-strengthening exercises on 2 or more days a week. ?Maintain a healthy weight. The body mass index (BMI) is a screening tool to identify possible weight problems. It provides an estimate of body fat based on height and weight. Your health care provider can find your BMI and can  help you achieve or maintain a healthy weight. For adults 20 years and older: ?A BMI below 18.5 is considered underweight. ?A BMI of 18.5 to 24.9 is normal. ?A BMI of 25 to 29.9 is considered overweight. ?A BMI of 30 and above is considered obese. ?Maintain normal blood lipids and cholesterol levels by exercising and minimizing your intake of saturated fat. Eat a balanced diet with plenty of fruit and vegetables. Blood tests for lipids and cholesterol should begin at age 67 and be repeated every 5 years. If your lipid or cholesterol levels are high, you are over 50, or you are at high risk for heart disease, you may need your cholesterol levels checked more frequently. Ongoing high lipid and cholesterol levels should be treated with medicines if diet and exercise are not working. ?If you smoke, find out from your health care provider how to quit. If you do not use tobacco, do not start. ?Lung cancer screening is recommended for adults aged 34-80 years who are at high risk for developing  lung cancer because of a history of smoking. A yearly low-dose CT scan of the lungs is recommended for people who have at least a 30-pack-year history of smoking and are a current smoker or have quit within the past 15 years. A pack year of smoking is smoking an average of 1 pack of cigarettes a day for 1 year (for example: 1 pack a day for 30 years or 2 packs a day for 15 years). Yearly screening should continue until the smoker has stopped smoking for at least 15 years. Yearly screening should be stopped for people who develop a health problem that would prevent them from having lung cancer treatment. ?High blood pressure causes heart disease and increases the risk of stroke. Your blood pressure should be checked at least every 1 to 2 years. Ongoing high blood pressure should be treated with medicines if weight loss and exercise do not work. ?If you are 15-80 years old, ask your health care provider if you should take aspirin to  prevent strokes. ?Diabetes screening involves taking a blood sample to check your fasting blood sugar level. This should be done once every 3 years, after age 72, if you are within normal weight and with

## 2021-12-21 ENCOUNTER — Encounter: Payer: Self-pay | Admitting: Internal Medicine

## 2021-12-21 ENCOUNTER — Ambulatory Visit (INDEPENDENT_AMBULATORY_CARE_PROVIDER_SITE_OTHER): Payer: BC Managed Care – PPO | Admitting: Internal Medicine

## 2021-12-21 VITALS — BP 140/80 | HR 67 | Temp 97.5°F | Ht 63.25 in | Wt 199.2 lb

## 2021-12-21 DIAGNOSIS — I10 Essential (primary) hypertension: Secondary | ICD-10-CM | POA: Diagnosis not present

## 2021-12-21 DIAGNOSIS — Z131 Encounter for screening for diabetes mellitus: Secondary | ICD-10-CM | POA: Diagnosis not present

## 2021-12-21 DIAGNOSIS — Z8249 Family history of ischemic heart disease and other diseases of the circulatory system: Secondary | ICD-10-CM | POA: Diagnosis not present

## 2021-12-21 DIAGNOSIS — Z130 Encounter for screening for diseases of the blood and blood-forming organs and certain disorders involving the immune mechanism: Secondary | ICD-10-CM | POA: Diagnosis not present

## 2021-12-21 DIAGNOSIS — Z136 Encounter for screening for cardiovascular disorders: Secondary | ICD-10-CM

## 2021-12-21 DIAGNOSIS — E559 Vitamin D deficiency, unspecified: Secondary | ICD-10-CM | POA: Diagnosis not present

## 2021-12-21 DIAGNOSIS — Z0000 Encounter for general adult medical examination without abnormal findings: Secondary | ICD-10-CM | POA: Diagnosis not present

## 2021-12-21 DIAGNOSIS — R7309 Other abnormal glucose: Secondary | ICD-10-CM

## 2021-12-21 DIAGNOSIS — I700 Atherosclerosis of aorta: Secondary | ICD-10-CM

## 2021-12-21 DIAGNOSIS — Z1389 Encounter for screening for other disorder: Secondary | ICD-10-CM

## 2021-12-21 DIAGNOSIS — Z0001 Encounter for general adult medical examination with abnormal findings: Secondary | ICD-10-CM

## 2021-12-21 DIAGNOSIS — Z111 Encounter for screening for respiratory tuberculosis: Secondary | ICD-10-CM

## 2021-12-21 DIAGNOSIS — E669 Obesity, unspecified: Secondary | ICD-10-CM

## 2021-12-21 DIAGNOSIS — Z13220 Encounter for screening for lipoid disorders: Secondary | ICD-10-CM | POA: Diagnosis not present

## 2021-12-21 DIAGNOSIS — Z79899 Other long term (current) drug therapy: Secondary | ICD-10-CM | POA: Diagnosis not present

## 2021-12-21 DIAGNOSIS — E782 Mixed hyperlipidemia: Secondary | ICD-10-CM

## 2021-12-21 DIAGNOSIS — Z1211 Encounter for screening for malignant neoplasm of colon: Secondary | ICD-10-CM

## 2021-12-21 DIAGNOSIS — R5383 Other fatigue: Secondary | ICD-10-CM

## 2021-12-21 MED ORDER — TOPIRAMATE 50 MG PO TABS: ORAL_TABLET | ORAL | 1 refills | Status: DC

## 2021-12-21 MED ORDER — PHENTERMINE HCL 37.5 MG PO TABS: ORAL_TABLET | ORAL | 1 refills | Status: DC

## 2021-12-22 LAB — HEMOGLOBIN A1C
Hgb A1c MFr Bld: 5.1 % of total Hgb (ref <5.7)
Mean Plasma Glucose: 100 mg/dL
eAG (mmol/L): 5.5 mmol/L

## 2021-12-22 LAB — URINALYSIS, ROUTINE W REFLEX MICROSCOPIC
Bilirubin Urine: NEGATIVE
Glucose, UA: NEGATIVE
Hgb urine dipstick: NEGATIVE
Ketones, ur: NEGATIVE
Leukocytes,Ua: NEGATIVE
Nitrite: NEGATIVE
Protein, ur: NEGATIVE
Specific Gravity, Urine: 1.006 (ref 1.001–1.035)
pH: 6.5 (ref 5.0–8.0)

## 2021-12-22 LAB — COMPLETE METABOLIC PANEL WITH GFR
AG Ratio: 2.2 (calc) (ref 1.0–2.5)
ALT: 20 U/L (ref 6–29)
AST: 22 U/L (ref 10–35)
Albumin: 4.6 g/dL (ref 3.6–5.1)
Alkaline phosphatase (APISO): 76 U/L (ref 37–153)
BUN: 24 mg/dL (ref 7–25)
CO2: 26 mmol/L (ref 20–32)
Calcium: 10.4 mg/dL (ref 8.6–10.4)
Chloride: 105 mmol/L (ref 98–110)
Creat: 0.96 mg/dL (ref 0.50–1.05)
Globulin: 2.1 g/dL (calc) (ref 1.9–3.7)
Glucose, Bld: 99 mg/dL (ref 65–99)
Potassium: 4.1 mmol/L (ref 3.5–5.3)
Sodium: 141 mmol/L (ref 135–146)
Total Bilirubin: 0.5 mg/dL (ref 0.2–1.2)
Total Protein: 6.7 g/dL (ref 6.1–8.1)
eGFR: 67 mL/min/{1.73_m2} (ref >=60)

## 2021-12-22 LAB — CBC WITH DIFFERENTIAL/PLATELET
Absolute Monocytes: 608 cells/uL (ref 200–950)
Basophils Absolute: 91 cells/uL (ref 0–200)
Basophils Relative: 1.2 %
Eosinophils Absolute: 122 cells/uL (ref 15–500)
Eosinophils Relative: 1.6 %
HCT: 42.1 % (ref 35.0–45.0)
Hemoglobin: 14 g/dL (ref 11.7–15.5)
Lymphs Abs: 1832 cells/uL (ref 850–3900)
MCH: 30.8 pg (ref 27.0–33.0)
MCHC: 33.3 g/dL (ref 32.0–36.0)
MCV: 92.5 fL (ref 80.0–100.0)
MPV: 11.3 fL (ref 7.5–12.5)
Monocytes Relative: 8 %
Neutro Abs: 4948 cells/uL (ref 1500–7800)
Neutrophils Relative %: 65.1 %
Platelets: 233 10*3/uL (ref 140–400)
RBC: 4.55 10*6/uL (ref 3.80–5.10)
RDW: 12.9 % (ref 11.0–15.0)
Total Lymphocyte: 24.1 %
WBC: 7.6 10*3/uL (ref 3.8–10.8)

## 2021-12-22 LAB — LIPID PANEL
Cholesterol: 151 mg/dL (ref <200)
HDL: 46 mg/dL — ABNORMAL LOW (ref >=50)
LDL Cholesterol (Calc): 75 mg/dL (calc)
Non-HDL Cholesterol (Calc): 105 mg/dL (calc) (ref <130)
Total CHOL/HDL Ratio: 3.3 (calc) (ref <5.0)
Triglycerides: 205 mg/dL — ABNORMAL HIGH (ref <150)

## 2021-12-22 LAB — MICROALBUMIN / CREATININE URINE RATIO
Creatinine, Urine: 31 mg/dL (ref 20–275)
Microalb, Ur: <0.2 mg/dL

## 2021-12-22 LAB — MAGNESIUM: Magnesium: 2.2 mg/dL (ref 1.5–2.5)

## 2021-12-22 LAB — TSH: TSH: 2.54 mIU/L (ref 0.40–4.50)

## 2021-12-22 LAB — VITAMIN D 25 HYDROXY (VIT D DEFICIENCY, FRACTURES): Vit D, 25-Hydroxy: 110 ng/mL — ABNORMAL HIGH (ref 30–100)

## 2021-12-22 LAB — VITAMIN B12: Vitamin B-12: 779 pg/mL (ref 200–1100)

## 2021-12-22 LAB — IRON, TOTAL/TOTAL IRON BINDING CAP
%SAT: 28 % (calc) (ref 16–45)
Iron: 103 ug/dL (ref 45–160)
TIBC: 366 mcg/dL (calc) (ref 250–450)

## 2021-12-22 LAB — INSULIN, RANDOM: Insulin: 29.4 u[IU]/mL — ABNORMAL HIGH

## 2021-12-22 NOTE — Progress Notes (Signed)
<><><><><><><><><><><><><><><><><><><><><><><><><><><><><><><><><> <><><><><><><><><><><><><><><><><><><><><><><><><><><><><><><><><>  -   Total Chol = 151   &     LDL Chol    = 75    - Both  Excellent   - Very low risk for Heart Attack  / Stroke <><><><><><><><><><><><><><><><><><><><><><><><><><><><><><><><><>  - Vitamin D = 110 - is borderline high normal,                                              So recommend decease from 4,000 units /day    - down to take 4,000 units  alternating every other day with  2,000 units /day  <><><><><><><><><><><><><><><><><><><><><><><><><><><><><><><><><>  - Iron & Vitamin B12 levels are both Normal <><><><><><><><><><><><><><><><><><><><><><><><><><><><><><><><><>  - A1c -  Normal - No Diabetes - Great !  <><><><><><><><><><><><><><><><><><><><><><><><><><><><><><><><><>  - All Else - CBC - Kidneys - Electrolytes - Liver - Magnesium & Thyroid    - all  Normal / OK <><><><><><><><><><><><><><><><><><><><><><><><><><><><><><><><><> <><><><><><><><><><><><><><><><><><><><><><><><><><><><><><><><><>

## 2022-03-06 DIAGNOSIS — K5730 Diverticulosis of large intestine without perforation or abscess without bleeding: Secondary | ICD-10-CM | POA: Diagnosis not present

## 2022-03-06 DIAGNOSIS — Z800 Family history of malignant neoplasm of digestive organs: Secondary | ICD-10-CM | POA: Diagnosis not present

## 2022-03-06 DIAGNOSIS — K635 Polyp of colon: Secondary | ICD-10-CM | POA: Diagnosis not present

## 2022-03-06 DIAGNOSIS — Z1211 Encounter for screening for malignant neoplasm of colon: Secondary | ICD-10-CM | POA: Diagnosis not present

## 2022-03-06 DIAGNOSIS — Z86010 Personal history of colonic polyps: Secondary | ICD-10-CM | POA: Diagnosis not present

## 2022-03-06 LAB — HM COLONOSCOPY

## 2022-03-10 LAB — HM COLONOSCOPY

## 2022-03-14 ENCOUNTER — Other Ambulatory Visit: Payer: Self-pay | Admitting: Internal Medicine

## 2022-03-14 MED ORDER — PRAVASTATIN SODIUM 40 MG PO TABS: ORAL_TABLET | ORAL | 3 refills | Status: DC

## 2022-03-29 ENCOUNTER — Encounter: Payer: Self-pay | Admitting: Internal Medicine

## 2022-04-03 ENCOUNTER — Ambulatory Visit: Payer: BC Managed Care – PPO | Admitting: Nurse Practitioner

## 2022-04-03 ENCOUNTER — Encounter: Payer: Self-pay | Admitting: Nurse Practitioner

## 2022-04-03 VITALS — BP 116/70 | HR 73 | Temp 97.7°F | Ht 64.0 in | Wt 198.0 lb

## 2022-04-03 DIAGNOSIS — Z1211 Encounter for screening for malignant neoplasm of colon: Secondary | ICD-10-CM

## 2022-04-03 DIAGNOSIS — E782 Mixed hyperlipidemia: Secondary | ICD-10-CM

## 2022-04-03 DIAGNOSIS — I10 Essential (primary) hypertension: Secondary | ICD-10-CM

## 2022-04-03 DIAGNOSIS — J45909 Unspecified asthma, uncomplicated: Secondary | ICD-10-CM | POA: Diagnosis not present

## 2022-04-03 DIAGNOSIS — R7309 Other abnormal glucose: Secondary | ICD-10-CM | POA: Diagnosis not present

## 2022-04-03 DIAGNOSIS — E559 Vitamin D deficiency, unspecified: Secondary | ICD-10-CM | POA: Diagnosis not present

## 2022-04-03 DIAGNOSIS — Z9109 Other allergy status, other than to drugs and biological substances: Secondary | ICD-10-CM

## 2022-04-03 DIAGNOSIS — Z79899 Other long term (current) drug therapy: Secondary | ICD-10-CM

## 2022-04-03 DIAGNOSIS — K219 Gastro-esophageal reflux disease without esophagitis: Secondary | ICD-10-CM

## 2022-04-03 DIAGNOSIS — D125 Benign neoplasm of sigmoid colon: Secondary | ICD-10-CM

## 2022-04-03 NOTE — Progress Notes (Signed)
FOLLOW UP  Assessment and Plan:   Asthma Follows with Dr. Carmelina Peal Allergy Specialist Well controlled on current regimen Continue meds, avoid triggers  Hypertension Improved on recheck Monitor blood pressure at home; patient to call if consistently greater than 130/80 Continue DASH diet.   Reminder to go to the ER if any CP, SOB, nausea, dizziness, severe HA, changes vision/speech, left arm numbness and tingling and jaw pain.  Cholesterol continue statin therapy; working on lifestyle Continue low cholesterol diet and exercise; reduce saturated fat Check lipid panel.   Other abnormal glucose Recent A1Cs at goal Discussed diet/exercise, weight management  Defer A1C; check CMP  Morbid obesity (HCC) - BMI 35 with htn, hld Long discussion about weight loss, diet, and exercise Recommended diet heavy in fruits and veggies and low in animal meats, cheeses, and dairy products, appropriate calorie intake Patient will work on reducing stress eating, portion control, clean eating (has done well with next 56 days diet, plans to restart) Will follow up in 3 months  Vitamin D Def  Elevated at last visit; continue supplementation to maintain goal of 60-100  GERD/ Hx of NSAID induced gastritis Okay to stop Pantoprazole.   Continue Famotidine Well managed on current medications  Using celebrex sparingly without concerning sx Discussed diet, avoiding triggers and other lifestyle changes  Allergies/cough Follows with allergist; encouraged she try nasal saline irrigation followed by topical steroid at night x 2 weeks to evaluate benefit for secretions/AM cough  Abnormal TSH Resolved. Continue to monitor  Medication Management All medications discussed and reviewed in full. All questions and concerns regarding medications addressed.    Screening for Colon Cancer/Benign neoplasm Completed Colonoscopy 03/06/22; benign neoplasm of sigmoid colon. Recall 5 years  Orders Placed This  Encounter  Procedures   CBC with Differential/Platelet   COMPLETE METABOLIC PANEL WITH GFR   Magnesium   Lipid panel   TSH   Hemoglobin A1c   VITAMIN D 25 Hydroxy (Vit-D Deficiency, Fractures)     Continue diet and meds as discussed. Further disposition pending results of labs. Discussed med's effects and SE's.    Over 20 minutes of exam, counseling, chart review, and critical decision making was performed.   Future Appointments  Date Time Provider [place]  04/24/2022  8:30 AM Kozlow, Donnamarie Poag, MD [facility] None  07/10/2022  4:00 PM Unk Pinto, MD [facility] None  12/28/2022  3:00 PM Unk Pinto, MD [facility] None    ----------------------------------------------------------------------------------------------------------------------  HPI 61 y.o. female  presents for 3 month follow up on hypertension, cholesterol, glucose management, obesity and vitamin D deficiency.   She has asthma currently well controlled on Symbicort and Singulair. She uses albuterol rarely. Allergies, follows recently with Dr. Tina Griffiths. Has had persistent dry cough, secretions in AM. Taking xyxal. Has nasocort but doesn't like to use. Was recently placed on Pantoprazole for cough but no longer wants to take d/t increase r/f malabsorption.   she has a diagnosis of NSAID gastritis (per 2017 EGD).   Screening for Colon Cancer Completed Colonoscopy 03/06/22; benign neoplasm of sigmoid colon. Recall 5 years  She has bilateral thumb OA and is seeing Dr. Fredna Dow, she is on celebrex PRN at this time. Her pain is somewhat improved and monitoring for now, wants to avoid surgery.  She will continue Famotidine.    BMI is Body mass index is 33.99 kg/m., she has been working on diet and exercise. She plans to getting back on the next 56 days diet (has worked well for her in  the past), goal to get back down to 145 lb. She is making better choices, just eating too much. She is no longer taking Phentermine or  Topamax d/t SE. Wt Readings from Last 3 Encounters:  04/03/22 198 lb (89.8 kg)  12/21/21 199 lb 3.2 oz (90.4 kg)  10/19/21 203 lb (92.1 kg)    She is on cholesterol medication (pravastatin 40 mg daily) and denies myalgias. Her cholesterol is at goal. The cholesterol last visit was:   Lab Results  Component Value Date   CHOL 151 12/21/2021   HDL 46 (L) 12/21/2021   LDLCALC 75 12/21/2021   TRIG 205 (H) 12/21/2021   CHOLHDL 3.3 12/21/2021    She has been working on diet and exercise for glucose management, and denies foot ulcerations, increased appetite, nausea, paresthesia of the feet, polydipsia, polyuria, visual disturbances, vomiting and weight loss. Last A1C in the office was:  Lab Results  Component Value Date   HGBA1C 5.1 12/21/2021   She had new mild hypothyroid at last visit, denies fatigue, dry skin, hair loss, constipation.  Lab Results  Component Value Date   TSH 2.54 12/21/2021   Last GFR:  Lab Results  Component Value Date   GFRNONAA 71 01/24/2021   Patient is on Vitamin D supplement and at goal at recent check:    Lab Results  Component Value Date   VD25OH 110 (H) 12/21/2021       Current Medications:  Current Outpatient Medications on File Prior to Visit  Medication Sig   albuterol (VENTOLIN HFA) 108 (90 Base) MCG/ACT inhaler Can inhale two puffs every four to six hours as needed for cough or wheeze.   aspirin 81 MG tablet Take 81 mg by mouth daily.   bisoprolol-hydrochlorothiazide (ZIAC) 5-6.25 MG tablet TAKE ONE TABLET BY MOUTH EVERY DAY FOR BLOOD PRESSURE   CALCIUM-MAGNESIUM-VITAMIN D PO Take by mouth daily.   celecoxib (CELEBREX) 100 MG capsule Take 100 mg by mouth daily as needed.   fexofenadine (ALLEGRA) 180 MG tablet Take 180 mg by mouth daily.   levocetirizine (XYZAL) 5 MG tablet 1 tablet 1-2 times a day as needed   montelukast (SINGULAIR) 10 MG tablet TAKE ONE TABLET BY MOUTH AT BEDTIME   Multiple Vitamin (MULTIVITAMIN PO) Take by mouth daily.    olmesartan (BENICAR) 40 MG tablet TAKE ONE TABLET BY MOUTH AT BEDTIME FOR BLOOD PRESSURE   pantoprazole (PROTONIX) 40 MG tablet Take one tablet daily   pravastatin (PRAVACHOL) 40 MG tablet Take 1 tab at Bedtime  for Cholesterol.   [place] 200-62.5-25 MCG/ACT AEPB INHALE 1 PUFF BY MOUTH EVERY DAY. RINSE MOUTH AFTER USE   Cholecalciferol (VITAMIN D PO) Take 4,000 Units by mouth daily.  (Patient not taking: Reported on 04/03/2022)   famotidine (PEPCID) 40 MG tablet Take 1 tablet (40 mg total) by mouth at bedtime. (Patient not taking: Reported on 04/03/2022)   phentermine (ADIPEX-P) 37.5 MG tablet Take 1/2 to 1 tablet every Morning for Dieting & Weight Loss (Patient not taking: Reported on 04/03/2022)   topiramate (TOPAMAX) 50 MG tablet Take 1/2 to 1 tablet 2 x /day at Suppertime & Bedtime for Dieting & Weight Loss (Patient not taking: Reported on 04/03/2022)   No current facility-administered medications on file prior to visit.     Allergies:  Allergies  Allergen Reactions   Ace Inhibitors     cough   Azithromycin     rash   Prednisone     High dose   Vicodin [  Hydrocodone-Acetaminophen]     Nausea/vomiting     Medical History:  Past Medical History:  Diagnosis Date   Allergy    Asthma    Eczema    History of COVID-19 03/01/2020   [facility]-01/14/20    Hyperlipidemia    Hypertension    Migraine    NSAID induced gastritis 03/01/2020   Obesity    Prediabetes    Vitamin D deficiency    Family history- Reviewed and unchanged Social history- Reviewed and unchanged   Review of Systems:  Review of Systems  Constitutional:  Negative for malaise/fatigue and weight loss.  HENT:  Positive for congestion. Negative for hearing loss, sinus pain and tinnitus.   Eyes:  Negative for blurred vision and double vision.  Respiratory:  Positive for cough (AM, productive, chronic with allergies, stable). Negative for shortness of breath and wheezing.   Cardiovascular:   Negative for chest pain, palpitations, orthopnea, claudication and leg swelling.  Gastrointestinal:  Negative for abdominal pain, blood in stool, constipation, diarrhea, heartburn, melena, nausea and vomiting.  Genitourinary: Negative.   Musculoskeletal:  Positive for joint pain (bil thumbs). Negative for falls, myalgias and neck pain.  Skin:  Negative for rash.  Neurological:  Negative for dizziness, tingling, sensory change, weakness and headaches.  Endo/Heme/Allergies:  Positive for environmental allergies. Negative for polydipsia.  Psychiatric/Behavioral: Negative.    All other systems reviewed and are negative.   Physical Exam: BP 116/70   Pulse 73   Temp 97.7 F (36.5 C)   Ht '5\' 4"'$  (1.626 m)   Wt 198 lb (89.8 kg)   SpO2 97%   BMI 33.99 kg/m  Wt Readings from Last 3 Encounters:  04/03/22 198 lb (89.8 kg)  12/21/21 199 lb 3.2 oz (90.4 kg)  10/19/21 203 lb (92.1 kg)   General Appearance: Well nourished, in no apparent distress. Eyes: PERRLA, EOMs, conjunctiva no swelling or erythema Sinuses: No Frontal/maxillary tenderness ENT/Mouth: Ext aud canals clear, TMs without erythema, bulging. No erythema, swelling, or exudate on post pharynx.  Tonsils not swollen or erythematous. Hearing normal. Mildly hoarse vocal quality.  Neck: Supple, thyroid normal.  Respiratory: Respiratory effort normal, BS equal bilaterally without rales, rhonchi, wheezing or stridor.  Cardio: RRR with no MRGs. Brisk peripheral pulses without edema.  Abdomen: Soft, + BS.  Non-tender, no guarding, rebound, hernias, masses. Lymphatics: Non tender without lymphadenopathy.  Musculoskeletal: Full ROM without effusion, she does have DIP joint bony enlargement and mild deviations. Normal gait.   Skin: Warm, dry without rashes, lesions, ecchymosis.  Neuro: Cranial nerves intact. No cerebellar symptoms.  Psych: Awake and oriented X 3, normal affect, Insight and Judgment appropriate.    Darrol Jump, NP 4:50  [facility]

## 2022-04-03 NOTE — Patient Instructions (Signed)

## 2022-04-04 LAB — COMPLETE METABOLIC PANEL WITH GFR
AG Ratio: 2.3 (calc) (ref 1.0–2.5)
ALT: 18 U/L (ref 6–29)
AST: 21 U/L (ref 10–35)
Albumin: 4.6 g/dL (ref 3.6–5.1)
Alkaline phosphatase (APISO): 81 U/L (ref 37–153)
BUN: 21 mg/dL (ref 7–25)
CO2: 25 mmol/L (ref 20–32)
Calcium: 9.9 mg/dL (ref 8.6–10.4)
Chloride: 106 mmol/L (ref 98–110)
Creat: 0.95 mg/dL (ref 0.50–1.05)
Globulin: 2 g/dL (calc) (ref 1.9–3.7)
Glucose, Bld: 111 mg/dL — ABNORMAL HIGH (ref 65–99)
Potassium: 4.3 mmol/L (ref 3.5–5.3)
Sodium: 140 mmol/L (ref 135–146)
Total Bilirubin: 0.4 mg/dL (ref 0.2–1.2)
Total Protein: 6.6 g/dL (ref 6.1–8.1)
eGFR: 68 mL/min/{1.73_m2} (ref >=60)

## 2022-04-04 LAB — LIPID PANEL
Cholesterol: 171 mg/dL (ref <200)
HDL: 44 mg/dL — ABNORMAL LOW (ref >=50)
LDL Cholesterol (Calc): 91 mg/dL (calc)
Non-HDL Cholesterol (Calc): 127 mg/dL (calc) (ref <130)
Total CHOL/HDL Ratio: 3.9 (calc) (ref <5.0)
Triglycerides: 274 mg/dL — ABNORMAL HIGH (ref <150)

## 2022-04-04 LAB — CBC WITH DIFFERENTIAL/PLATELET
Absolute Monocytes: 434 cells/uL (ref 200–950)
Basophils Absolute: 70 cells/uL (ref 0–200)
Basophils Relative: 1 %
Eosinophils Absolute: 119 cells/uL (ref 15–500)
Eosinophils Relative: 1.7 %
HCT: 40.2 % (ref 35.0–45.0)
Hemoglobin: 13.9 g/dL (ref 11.7–15.5)
Lymphs Abs: 1617 cells/uL (ref 850–3900)
MCH: 31.4 pg (ref 27.0–33.0)
MCHC: 34.6 g/dL (ref 32.0–36.0)
MCV: 91 fL (ref 80.0–100.0)
MPV: 11 fL (ref 7.5–12.5)
Monocytes Relative: 6.2 %
Neutro Abs: 4760 cells/uL (ref 1500–7800)
Neutrophils Relative %: 68 %
Platelets: 224 10*3/uL (ref 140–400)
RBC: 4.42 10*6/uL (ref 3.80–5.10)
RDW: 12.3 % (ref 11.0–15.0)
Total Lymphocyte: 23.1 %
WBC: 7 10*3/uL (ref 3.8–10.8)

## 2022-04-04 LAB — HEMOGLOBIN A1C
Hgb A1c MFr Bld: 5.2 % of total Hgb (ref <5.7)
Mean Plasma Glucose: 103 mg/dL
eAG (mmol/L): 5.7 mmol/L

## 2022-04-04 LAB — MAGNESIUM: Magnesium: 2.3 mg/dL (ref 1.5–2.5)

## 2022-04-04 LAB — VITAMIN D 25 HYDROXY (VIT D DEFICIENCY, FRACTURES): Vit D, 25-Hydroxy: 68 ng/mL (ref 30–100)

## 2022-04-04 LAB — TSH: TSH: 3.2 mIU/L (ref 0.40–4.50)

## 2022-04-05 ENCOUNTER — Encounter: Payer: Self-pay | Admitting: Nurse Practitioner

## 2022-04-13 ENCOUNTER — Encounter: Payer: Self-pay | Admitting: Internal Medicine

## 2022-04-24 ENCOUNTER — Encounter: Payer: Self-pay | Admitting: Allergy and Immunology

## 2022-04-24 ENCOUNTER — Ambulatory Visit: Payer: BC Managed Care – PPO | Admitting: Allergy and Immunology

## 2022-04-24 VITALS — BP 142/80 | HR 64 | Resp 12

## 2022-04-24 DIAGNOSIS — K219 Gastro-esophageal reflux disease without esophagitis: Secondary | ICD-10-CM

## 2022-04-24 DIAGNOSIS — J301 Allergic rhinitis due to pollen: Secondary | ICD-10-CM | POA: Diagnosis not present

## 2022-04-24 DIAGNOSIS — J4540 Moderate persistent asthma, uncomplicated: Secondary | ICD-10-CM

## 2022-04-24 NOTE — Patient Instructions (Addendum)
  1.  Allergen avoidance measures - pollens  2.  Treat and prevent inflammation:   A. Montelukast 10 mg - 1 tablet 1 time per day  B. Trelegy 200 - 1 inhalation 3-7 times per week   C. Nasacort - 1 spray each nostril 1-7 times per week  3. Treat and prevent LPR:   A. Pantoprazole 40 mg - 1 tablet in AM  B. Famotidine 40 mg - 1/2 - 1 tablet in PM  4. If needed:   A. Xyzal 5 mg - 1 tablet 1-2 times per day  B. Albuterol HFA - 2 inhalations every 4-6 hours  5. Return to clinic in 6 months or earlier if problem  6. Fall flu vaccine and RSV vaccine

## 2022-04-24 NOTE — Progress Notes (Unsigned)
[place]   Follow-up Note  Referring Provider: Unk Pinto, MD Primary Provider: Unk Pinto, MD Date of Office Visit: 04/24/2022  Subjective:   Rhonda [name] (DOB: 1960-10-29) is a 61 y.o. female who returns to the [facility] on 04/24/2022 in re-evaluation of the following:  HPI: Rhonda [name] returns to this clinic in reevaluation of asthma, allergic rhinitis, and LPR.  I last saw her in this clinic on 17 October 2021.  It is only over the course of the past 2 weeks that she has developed some nasal congestion and a little bit of morning cough and drainage in her throat.  Prior to that point in time she really did well and did not require systemic steroid or antibiotic for any type of airway issue and rarely uses short acting bronchodilator while she continued on trilogy about 5 times per week and rarely used any nasal steroid.  She does not have any associated anosmia or ugly nasal discharge or other respiratory tract symptoms.  She thinks that her reflux is under very good control at this point in time.  She uses Protonix 1 time per day on a consistent basis.  Allergies as of 04/24/2022       Reactions   Ace Inhibitors    cough   Azithromycin    rash   Prednisone    High dose   Vicodin [hydrocodone-acetaminophen]    Nausea/vomiting        Medication List    albuterol 108 (90 Base) MCG/ACT inhaler Commonly known as: VENTOLIN HFA Can inhale two puffs every four to six hours as needed for cough or wheeze.   aspirin 81 MG tablet Take 81 mg by mouth daily.   bisoprolol-hydrochlorothiazide 5-6.25 MG tablet Commonly known as: ZIAC TAKE ONE TABLET BY MOUTH EVERY DAY FOR BLOOD PRESSURE   CALCIUM-MAGNESIUM-VITAMIN D PO Take by mouth daily.   celecoxib 100 MG capsule Commonly known as: CELEBREX Take 100 mg by mouth daily as needed.   famotidine 40 MG tablet Commonly known as: PEPCID Take 1 tablet (40  mg total) by mouth at bedtime.   levocetirizine 5 MG tablet Commonly known as: XYZAL 1 tablet 1-2 times a day as needed   montelukast 10 MG tablet Commonly known as: SINGULAIR TAKE ONE TABLET BY MOUTH AT BEDTIME   MULTIVITAMIN PO Take by mouth daily.   olmesartan 40 MG tablet Commonly known as: BENICAR TAKE ONE TABLET BY MOUTH AT BEDTIME FOR BLOOD PRESSURE   pantoprazole 40 MG tablet Commonly known as: PROTONIX Take one tablet daily   pravastatin 40 MG tablet Commonly known as: PRAVACHOL Take 1 tab at Bedtime  for Cholesterol.   Trelegy Ellipta 200-62.5-25 MCG/ACT Aepb Generic drug: Fluticasone-Umeclidin-Vilant INHALE 1 PUFF BY MOUTH EVERY DAY. RINSE MOUTH AFTER USE   VITAMIN D PO Take 4,000 Units by mouth daily.    Past Medical History:  Diagnosis Date   Allergy    Asthma    Eczema    History of COVID-19 03/01/2020   [facility]-01/14/20    Hyperlipidemia    Hypertension    Migraine    NSAID induced gastritis 03/01/2020   Obesity    Prediabetes    Vitamin D deficiency     Past Surgical History:  Procedure Laterality Date   DILATION AND CURETTAGE, DIAGNOSTIC / THERAPEUTIC  2009   ENDOMETRIAL ABLATION     LYMPH NODE BIOPSY Left 1991   negative  NEVUS EXCISION  2012   dysplastic from back    Review of systems negative except as noted in HPI / PMHx or noted below:  Review of Systems  Constitutional: Negative.   HENT: Negative.    Eyes: Negative.   Respiratory: Negative.    Cardiovascular: Negative.   Gastrointestinal: Negative.   Genitourinary: Negative.   Musculoskeletal: Negative.   Skin: Negative.   Neurological: Negative.   Endo/Heme/Allergies: Negative.   Psychiatric/Behavioral: Negative.       Objective:   Vitals:   04/24/22 0849  BP: (!) 142/80  Pulse: 64  Resp: 12  SpO2: 94%          Physical Exam Constitutional:      Appearance: She is not diaphoretic.  HENT:     Head: Normocephalic.     Right Ear:  Tympanic membrane, ear canal and external ear normal.     Left Ear: Tympanic membrane, ear canal and external ear normal.     Nose: Nose normal. No mucosal edema or rhinorrhea.     Mouth/Throat:     Pharynx: Uvula midline. No oropharyngeal exudate.  Eyes:     Conjunctiva/sclera: Conjunctivae normal.  Neck:     Thyroid: No thyromegaly.     Trachea: Trachea normal. No tracheal tenderness or tracheal deviation.  Cardiovascular:     Rate and Rhythm: Normal rate and regular rhythm.     Heart sounds: Normal heart sounds, S1 normal and S2 normal. No murmur heard. Pulmonary:     Effort: No respiratory distress.     Breath sounds: Normal breath sounds. No stridor. No wheezing or rales.  Lymphadenopathy:     Head:     Right side of head: No tonsillar adenopathy.     Left side of head: No tonsillar adenopathy.     Cervical: No cervical adenopathy.  Skin:    Findings: No erythema or rash.     Nails: There is no clubbing.  Neurological:     Mental Status: She is alert.     Diagnostics:    Spirometry was performed and demonstrated an FEV1 of 2.71 at 115 % of predicted.  Assessment and Plan:   1. Asthma, moderate persistent, well-controlled   2. Seasonal allergic rhinitis due to pollen   3. LPRD (laryngopharyngeal reflux disease)     1.  Allergen avoidance measures - pollens  2.  Treat and prevent inflammation:   A. Montelukast 10 mg - 1 tablet 1 time per day  B. Trelegy 200 - 1 inhalation 3-7 times per week   C. Nasacort - 1 spray each nostril 1-7 times per week  3. Treat and prevent LPR:   A. Pantoprazole 40 mg - 1 tablet in AM  B. Famotidine 40 mg - 1/2 - 1 tablet in PM  4. If needed:   A. Xyzal 5 mg - 1 tablet 1-2 times per day  B. Albuterol HFA - 2 inhalations every 4-6 hours  5. Return to clinic in 6 months or earlier if problem  Jermiah appears to have a little bit of problem with more respiratory tract some symptoms including some nasal congestion and postnasal drip  and slight cough in the morning and is difficult to say if this is secondary to pollen exposure or may be a manifestation of her LPR.  She can restart Nasacort finding a dose that works well for her and/or she can also add in a dose of famotidine in the evening to see if this helps her respiratory  tract symptoms.  For the most part she has a very good understanding of her disease state and [name] her medications work and appropriate dosing of her medications depending on disease activity.  Assuming she does well with this plan I will see her back in this clinic in 6 months or earlier if there is a problem.  Allena Katz, MD Allergy / Immunology [place]

## 2022-04-25 ENCOUNTER — Encounter: Payer: Self-pay | Admitting: Allergy and Immunology

## 2022-05-02 ENCOUNTER — Other Ambulatory Visit: Payer: Self-pay | Admitting: Allergy and Immunology

## 2022-05-08 ENCOUNTER — Other Ambulatory Visit: Payer: Self-pay | Admitting: Allergy and Immunology

## 2022-05-08 DIAGNOSIS — K219 Gastro-esophageal reflux disease without esophagitis: Secondary | ICD-10-CM

## 2022-06-01 DIAGNOSIS — M180 Bilateral primary osteoarthritis of first carpometacarpal joints: Secondary | ICD-10-CM | POA: Diagnosis not present

## 2022-06-01 DIAGNOSIS — M19041 Primary osteoarthritis, right hand: Secondary | ICD-10-CM | POA: Diagnosis not present

## 2022-06-01 DIAGNOSIS — M19042 Primary osteoarthritis, left hand: Secondary | ICD-10-CM | POA: Diagnosis not present

## 2022-06-03 DIAGNOSIS — R051 Acute cough: Secondary | ICD-10-CM | POA: Diagnosis not present

## 2022-06-03 DIAGNOSIS — U071 COVID-19: Secondary | ICD-10-CM | POA: Diagnosis not present

## 2022-07-10 ENCOUNTER — Ambulatory Visit: Payer: BC Managed Care – PPO | Admitting: Internal Medicine

## 2022-07-10 ENCOUNTER — Encounter: Payer: Self-pay | Admitting: Internal Medicine

## 2022-07-10 VITALS — BP 130/80 | HR 55 | Temp 97.9°F | Resp 16 | Ht 64.0 in | Wt 200.0 lb

## 2022-07-10 DIAGNOSIS — Z79899 Other long term (current) drug therapy: Secondary | ICD-10-CM | POA: Diagnosis not present

## 2022-07-10 DIAGNOSIS — R7309 Other abnormal glucose: Secondary | ICD-10-CM | POA: Diagnosis not present

## 2022-07-10 DIAGNOSIS — E559 Vitamin D deficiency, unspecified: Secondary | ICD-10-CM

## 2022-07-10 DIAGNOSIS — K219 Gastro-esophageal reflux disease without esophagitis: Secondary | ICD-10-CM

## 2022-07-10 DIAGNOSIS — E782 Mixed hyperlipidemia: Secondary | ICD-10-CM | POA: Diagnosis not present

## 2022-07-10 DIAGNOSIS — I10 Essential (primary) hypertension: Secondary | ICD-10-CM

## 2022-07-10 NOTE — Patient Instructions (Signed)

## 2022-07-10 NOTE — Progress Notes (Signed)
Future Appointments  Date Time Provider Department  07/10/2022             6 mo ov   4:00 PM Unk Pinto, MD [facility]  10/25/2022  8:30 AM Neldon Mc, Donnamarie Poag, MD [facility]  12/28/2022               cpe   3:00 PM Unk Pinto, MD [facility]    History of Present Illness:       This very nice 61 y.o. DWF  presents for 6  month follow up with HTN, HLD, Pre-Diabetes and Vitamin D Deficiency.        Patient is treated for HTN  since  & BP has been controlled at home. Today's BP is at goal -  130/80 . Patient has had no complaints of any cardiac type chest pain, palpitations, dyspnea / orthopnea / PND, dizziness, claudication, or dependent edema.       Hyperlipidemia is controlled with diet & meds. Patient denies myalgias or other med SE's. Last Lipids were at goal except elevated Trig's :  Lab Results  Component Value Date   CHOL 171 04/03/2022   HDL 44 (L) 04/03/2022   LDLCALC 91 04/03/2022   TRIG 274 (H) 04/03/2022   CHOLHDL 3.9 04/03/2022     Also, the patient has history of PreDiabetes and has had no symptoms of reactive hypoglycemia, diabetic polys, paresthesias or visual blurring.  Last A1c was at goal :  Lab Results  Component Value Date   HGBA1C 5.2 04/03/2022                                                            Further, the patient also has history of Vitamin D Deficiency and supplements vitamin D . Last vitamin D was at goal :   Lab Results  Component Value Date   VD25OH 68 04/03/2022     Current Outpatient Medications on File Prior to Visit  Medication Sig   albuterol HFA  inhaler Can inhale two puffs every four to six hours as needed for cough or wheeze.   aspirin 81 MG tablet Take  daily.   bisoprolol-hctz  5-6.25 MG tablet TAKE ONE TABLET EVERY DAY   CALCIUM-MAGNESIUM-VITAMIN D PO Take daily.   celecoxib (CELEBREX) 100 MG capsule Take  daily as needed.   Cholecalciferol (VITAMIN D PO) Take 4,000 Units daily.   levocetirizine (XYZAL) 5  MG tablet 1 tablet 1-2 times a day as needed   montelukast (SINGULAIR) 10 MG tablet TAKE ONE TABLET AT BEDTIME   Multiple Vitamin ) Take daily.   olmesartan (BENICAR) 40 MG tablet TAKE ONE TABLET AT BEDTIME    pantoprazole (PROTONIX) 40 MG tablet TAKE ONE TABLET IN THE AM   pravastatin (PRAVACHOL) 40 MG tablet Take 1 tab at Bedtime    TRELEGY ELLIPTA 200-62.5-25  INHALE 1 PUFF EVERY DAY     Allergies  Allergen Reactions   Ace Inhibitors     cough   Azithromycin     rash   Prednisone     High dose   Vicodin [Hydrocodone-Acetaminophen]     Nausea/vomiting     PMHx:   Past Medical History:  Diagnosis Date   Allergy    Asthma    Eczema    History  of COVID-19 03/01/2020   [facility]-01/14/20    Hyperlipidemia    Hypertension    Migraine    NSAID induced gastritis 03/01/2020   Obesity    Prediabetes    Vitamin D deficiency      Immunization History  Administered Date(s) Administered   PPD Test 05/12/2014, 05/25/2015, 07/05/2016, 08/03/2017, 08/27/2018, 12/21/2020   Pneumococcal-Unspecified 08/29/2009   Tdap 08/30/2007, 08/27/2018     Past Surgical History:  Procedure Laterality Date   DILATION AND CURETTAGE, DIAGNOSTIC / THERAPEUTIC  2009   ENDOMETRIAL ABLATION     LYMPH NODE BIOPSY Left 1991   negative   NEVUS EXCISION  2012   dysplastic from back    FHx:    Reviewed / unchanged  SHx:    Reviewed / unchanged   Systems Review:  Constitutional: Denies fever, chills, wt changes, headaches, insomnia, fatigue, night sweats, change in appetite. Eyes: Denies redness, blurred vision, diplopia, discharge, itchy, watery eyes.  ENT: Denies discharge, congestion, post nasal drip, epistaxis, sore throat, earache, hearing loss, dental pain, tinnitus, vertigo, sinus pain, snoring.  CV: Denies chest pain, palpitations, irregular heartbeat, syncope, dyspnea, diaphoresis, orthopnea, PND, claudication or edema. Respiratory: denies cough, dyspnea, DOE,  pleurisy, hoarseness, laryngitis, wheezing.  Gastrointestinal: Denies dysphagia, odynophagia, heartburn, reflux, water brash, abdominal pain or cramps, nausea, vomiting, bloating, diarrhea, constipation, hematemesis, melena, hematochezia  or hemorrhoids. Genitourinary: Denies dysuria, frequency, urgency, nocturia, hesitancy, discharge, hematuria or flank pain. Musculoskeletal: Denies arthralgias, myalgias, stiffness, jt. swelling, pain, limping or strain/sprain.  Skin: Denies pruritus, rash, hives, warts, acne, eczema or change in skin lesion(s). Neuro: No weakness, tremor, incoordination, spasms, paresthesia or pain. Psychiatric: Denies confusion, memory loss or sensory loss. Endo: Denies change in weight, skin or hair change.  Heme/Lymph: No excessive bleeding, bruising or enlarged lymph nodes.  Physical Exam  BP 130/80   Pulse (!) 55   Temp 97.9 F (36.6 C)   Resp 16   Ht '5\' 4"'$  (1.626 m)   Wt 200 lb (90.7 kg)   SpO2 96%   BMI 34.33 kg/m   Appears  well nourished, well groomed  and in no distress.  Eyes: PERRLA, EOMs, conjunctiva no swelling or erythema. Sinuses: No frontal/maxillary tenderness ENT/Mouth: EAC's clear, TM's nl w/o erythema, bulging. Nares clear w/o erythema, swelling, exudates. Oropharynx clear without erythema or exudates. Oral hygiene is good. Tongue normal, non obstructing. Hearing intact.  Neck: Supple. Thyroid not palpable. Car 2+/2+ without bruits, nodes or JVD. Chest: Respirations nl with BS clear & equal w/o rales, rhonchi, wheezing or stridor.  Cor: Heart sounds normal w/ regular rate and rhythm without sig. murmurs, gallops, clicks or rubs. Peripheral pulses normal and equal  without edema.  Abdomen: Soft & bowel sounds normal. Non-tender w/o guarding, rebound, hernias, masses or organomegaly.  Lymphatics: Unremarkable.  Musculoskeletal: Full ROM all peripheral extremities, joint stability, 5/5 strength and normal gait.  Skin: Warm, dry without exposed  rashes, lesions or ecchymosis apparent.  Neuro: Cranial nerves intact, reflexes equal bilaterally. Sensory-motor testing grossly intact. Tendon reflexes grossly intact.  Pysch: Alert & oriented x 3.  Insight and judgement nl & appropriate. No ideations.  Assessment and Plan:  1. Essential hypertension  - Continue medication, monitor blood pressure at home.  - Continue DASH diet.  Reminder to go to the ER if any CP,  SOB, nausea, dizziness, severe HA, changes vision/speech.   - CBC with Differential/Platelet - COMPLETE METABOLIC PANEL WITH GFR - Magnesium - TSH  2. Hyperlipidemia, mixed  -  Continue diet/meds, exercise,& lifestyle modifications.  - Continue monitor periodic cholesterol/liver & renal functions    - Lipid panel - TSH  3. Abnormal glucose  - Continue diet, exercise  - Lifestyle modifications.  - Monitor appropriate labs   - Hemoglobin A1c - Insulin, random  4. Vitamin D deficiency  - Continue supplementation   - VITAMIN D 25 Hydroxy   5. Gastroesophageal reflux disease  - CBC with Differential/Platelet  6. Medication management  - CBC with Differential/Platelet - COMPLETE METABOLIC PANEL WITH GFR - Magnesium - Lipid panel - TSH - Hemoglobin A1c - Insulin, random - VITAMIN D 25 Hydroxy           Discussed  regular exercise, BP monitoring, weight control to achieve/maintain BMI less than 25 and discussed med and SE's. Recommended labs to assess /monitor clinical status .  I discussed the assessment and treatment plan with the patient. The patient was provided an opportunity to ask questions and all were answered. The patient agreed with the plan and demonstrated an understanding of the instructions.  I provided over 30 minutes of exam, counseling, chart review and  complex critical decision making.        The patient was advised to call back or seek an in-person evaluation if the symptoms worsen or if the condition fails to improve as  anticipated.   Kirtland Bouchard, MD

## 2022-07-11 LAB — LIPID PANEL
Cholesterol: 167 mg/dL (ref <200)
HDL: 46 mg/dL — ABNORMAL LOW (ref >=50)
LDL Cholesterol (Calc): 94 mg/dL (calc)
Non-HDL Cholesterol (Calc): 121 mg/dL (calc) (ref <130)
Total CHOL/HDL Ratio: 3.6 (calc) (ref <5.0)
Triglycerides: 177 mg/dL — ABNORMAL HIGH (ref <150)

## 2022-07-11 LAB — CBC WITH DIFFERENTIAL/PLATELET
Absolute Monocytes: 530 cells/uL (ref 200–950)
Basophils Absolute: 94 cells/uL (ref 0–200)
Basophils Relative: 1.2 %
Eosinophils Absolute: 117 cells/uL (ref 15–500)
Eosinophils Relative: 1.5 %
HCT: 38.9 % (ref 35.0–45.0)
Hemoglobin: 13.4 g/dL (ref 11.7–15.5)
Lymphs Abs: 1958 cells/uL (ref 850–3900)
MCH: 31.5 pg (ref 27.0–33.0)
MCHC: 34.4 g/dL (ref 32.0–36.0)
MCV: 91.3 fL (ref 80.0–100.0)
MPV: 11.2 fL (ref 7.5–12.5)
Monocytes Relative: 6.8 %
Neutro Abs: 5101 cells/uL (ref 1500–7800)
Neutrophils Relative %: 65.4 %
Platelets: 220 10*3/uL (ref 140–400)
RBC: 4.26 10*6/uL (ref 3.80–5.10)
RDW: 13.1 % (ref 11.0–15.0)
Total Lymphocyte: 25.1 %
WBC: 7.8 10*3/uL (ref 3.8–10.8)

## 2022-07-11 LAB — COMPLETE METABOLIC PANEL WITH GFR
AG Ratio: 2.5 (calc) (ref 1.0–2.5)
ALT: 21 U/L (ref 6–29)
AST: 23 U/L (ref 10–35)
Albumin: 4.5 g/dL (ref 3.6–5.1)
Alkaline phosphatase (APISO): 73 U/L (ref 37–153)
BUN: 21 mg/dL (ref 7–25)
CO2: 26 mmol/L (ref 20–32)
Calcium: 9.6 mg/dL (ref 8.6–10.4)
Chloride: 105 mmol/L (ref 98–110)
Creat: 0.83 mg/dL (ref 0.50–1.05)
Globulin: 1.8 g/dL (calc) — ABNORMAL LOW (ref 1.9–3.7)
Glucose, Bld: 79 mg/dL (ref 65–99)
Potassium: 3.9 mmol/L (ref 3.5–5.3)
Sodium: 141 mmol/L (ref 135–146)
Total Bilirubin: 0.5 mg/dL (ref 0.2–1.2)
Total Protein: 6.3 g/dL (ref 6.1–8.1)
eGFR: 79 mL/min/{1.73_m2} (ref >=60)

## 2022-07-11 LAB — INSULIN, RANDOM: Insulin: 16.9 u[IU]/mL

## 2022-07-11 LAB — MAGNESIUM: Magnesium: 2.1 mg/dL (ref 1.5–2.5)

## 2022-07-11 LAB — HEMOGLOBIN A1C
Hgb A1c MFr Bld: 5.5 % of total Hgb (ref <5.7)
Mean Plasma Glucose: 111 mg/dL
eAG (mmol/L): 6.2 mmol/L

## 2022-07-11 LAB — VITAMIN D 25 HYDROXY (VIT D DEFICIENCY, FRACTURES): Vit D, 25-Hydroxy: 72 ng/mL (ref 30–100)

## 2022-07-11 LAB — TSH: TSH: 3.27 mIU/L (ref 0.40–4.50)

## 2022-07-11 NOTE — Progress Notes (Signed)
<><><><><><><><><><><><><><><><><><><><><><><><><><><><><><><><><> <><><><><><><><><><><><><><><><><><><><><><><><><><><><><><><><><>  -   Total Chol = 167  - Excellent  <><><><><><><><><><><><><><><><><><><><><><><><><><><><><><><><><>  - A1c - Normal - No Diabetes  - Great ! <><><><><><><><><><><><><><><><><><><><><><><><><><><><><><><><><>  -  Vit D = 72 - Excellent  - Please keep dose same  <><><><><><><><><><><><><><><><><><><><><><><><><><><><><><><><><>  -  All Else - CBC - Kidneys - Electrolytes - Liver - Magnesium & Thyroid    - all  Normal / OK <><><><><><><><><><><><><><><><><><><><><><><><><><><><><><><><><> <><><><><><><><><><><><><><><><><><><><><><><><><><><><><><><><><>

## 2022-07-13 ENCOUNTER — Other Ambulatory Visit: Payer: Self-pay | Admitting: Internal Medicine

## 2022-07-13 ENCOUNTER — Other Ambulatory Visit: Payer: Self-pay | Admitting: Allergy and Immunology

## 2022-08-16 ENCOUNTER — Ambulatory Visit: Payer: BC Managed Care – PPO | Admitting: Allergy and Immunology

## 2022-08-17 ENCOUNTER — Ambulatory Visit: Payer: BC Managed Care – PPO | Admitting: Allergy and Immunology

## 2022-08-17 ENCOUNTER — Telehealth: Payer: Self-pay | Admitting: Allergy and Immunology

## 2022-08-17 ENCOUNTER — Encounter: Payer: Self-pay | Admitting: Allergy and Immunology

## 2022-08-17 VITALS — BP 142/80 | HR 72 | Resp 14

## 2022-08-17 DIAGNOSIS — J301 Allergic rhinitis due to pollen: Secondary | ICD-10-CM

## 2022-08-17 DIAGNOSIS — K219 Gastro-esophageal reflux disease without esophagitis: Secondary | ICD-10-CM

## 2022-08-17 DIAGNOSIS — J988 Other specified respiratory disorders: Secondary | ICD-10-CM

## 2022-08-17 DIAGNOSIS — J4540 Moderate persistent asthma, uncomplicated: Secondary | ICD-10-CM

## 2022-08-17 DIAGNOSIS — B9789 Other viral agents as the cause of diseases classified elsewhere: Secondary | ICD-10-CM

## 2022-08-17 NOTE — Telephone Encounter (Signed)
Patient informed of Dr. Bruna Potter message.  She did perform a COVID test Wednesday and it was negative.

## 2022-08-17 NOTE — Telephone Encounter (Addendum)
Patient believes she is developing a sinus infection. She has a sore throat, a lot of face pressure/congestion and headaches. She is wanting to know if there is anything we can send in for her or would she need to be seen in office.   [place]

## 2022-08-17 NOTE — Progress Notes (Signed)
Rhonda [name]   Follow-up Note  Referring Provider: Unk Pinto, MD Primary Provider: Unk Pinto, MD Date of Office Visit: 08/17/2022  Subjective:   Rhonda [name] (DOB: [birth year]) is a 62 y.o. female who returns to the [facility] on 08/17/2022 in re-evaluation of the following:  HPI: Rhonda [name] returns to this clinic in reevaluation of asthma, allergic rhinitis, LPR.  I last saw her in this clinic 24 April 2022.  She was really doing very well with both her upper and lower airway and her reflux disease but unfortunately 48 hours ago she had acute onset of head congestion and some yellow nasal discharge and postnasal drip and sore throat mostly in the morning without much cough or other respiratory tract symptoms and without any fever.  She did check a COVID test which was negative.  Allergies as of 08/17/2022       Reactions   Ace Inhibitors    cough   Azithromycin    rash   Prednisone    High dose   Vicodin [hydrocodone-acetaminophen]    Nausea/vomiting        Medication List    albuterol 108 (90 Base) MCG/ACT inhaler Commonly known as: VENTOLIN HFA Can inhale two puffs every four to six hours as needed for cough or wheeze.   aspirin 81 MG tablet Take 81 mg by mouth daily.   bisoprolol-hydrochlorothiazide 5-6.25 MG tablet Commonly known as: ZIAC TAKE ONE TABLET BY MOUTH EVERY DAY FOR BLOOD PRESSURE   CALCIUM-MAGNESIUM-VITAMIN D PO Take by mouth daily.   celecoxib 200 MG capsule Commonly known as: CELEBREX Take  1 capsule  Daily  with Food for Pain & Inflammation                                       /                                           TAKE                                              BY                                            MOUTH   famotidine 40 MG tablet Commonly known as: PEPCID Take 1 tablet (40 mg total) by mouth at bedtime.   levocetirizine 5 MG tablet Commonly known  as: XYZAL 1 tablet 1-2 times a day as needed   montelukast 10 MG tablet Commonly known as: SINGULAIR TAKE ONE TABLET BY MOUTH AT BEDTIME   MULTIVITAMIN PO Take by mouth daily.   olmesartan 40 MG tablet Commonly known as: BENICAR TAKE ONE TABLET BY MOUTH AT BEDTIME FOR BLOOD PRESSURE   pantoprazole 40 MG tablet Commonly known as: PROTONIX TAKE ONE TABLET BY MOUTH IN THE AM   pravastatin 40 MG tablet Commonly known as: PRAVACHOL Take 1 tab at Bedtime  for Cholesterol.   Trelegy Ellipta 200-62.5-25 MCG/ACT Aepb Generic drug: Fluticasone-Umeclidin-Vilant INHALE  1 PUFF BY MOUTH EVERY DAY. RINSE MOUTH AFTER USE    Past Medical History:  Diagnosis Date   Allergy    Asthma    Eczema    History of COVID-19 03/01/2020   [facility]-01/14/20    Hyperlipidemia    Hypertension    Migraine    NSAID induced gastritis 03/01/2020   Obesity    Prediabetes    Vitamin D deficiency     Past Surgical History:  Procedure Laterality Date   DILATION AND CURETTAGE, DIAGNOSTIC / THERAPEUTIC  2009   ENDOMETRIAL ABLATION     LYMPH NODE BIOPSY Left 1991   negative   NEVUS EXCISION  2012   dysplastic from back    Review of systems negative except as noted in HPI / PMHx or noted below:  Review of Systems  Constitutional: Negative.   HENT: Negative.    Eyes: Negative.   Respiratory: Negative.    Cardiovascular: Negative.   Gastrointestinal: Negative.   Genitourinary: Negative.   Musculoskeletal: Negative.   Skin: Negative.   Neurological: Negative.   Endo/Heme/Allergies: Negative.   Psychiatric/Behavioral: Negative.       Objective:   Vitals:   08/17/22 1552  BP: (!) 142/80  Pulse: 72  Resp: 14  SpO2: 93%          Physical Exam Constitutional:      Appearance: She is not diaphoretic.     Comments: Nasal voice  HENT:     Head: Normocephalic.     Right Ear: Tympanic membrane, ear canal and external ear normal.     Left Ear: Tympanic membrane, ear  canal and external ear normal.     Nose: Nose normal. No mucosal edema or rhinorrhea.     Mouth/Throat:     Pharynx: Uvula midline. No oropharyngeal exudate.  Eyes:     Conjunctiva/sclera: Conjunctivae normal.  Neck:     Thyroid: No thyromegaly.     Trachea: Trachea normal. No tracheal tenderness or tracheal deviation.  Cardiovascular:     Rate and Rhythm: Normal rate and regular rhythm.     Heart sounds: Normal heart sounds, S1 normal and S2 normal. No murmur heard. Pulmonary:     Effort: No respiratory distress.     Breath sounds: Normal breath sounds. No stridor. No wheezing or rales.  Lymphadenopathy:     Head:     Right side of head: No tonsillar adenopathy.     Left side of head: No tonsillar adenopathy.     Cervical: No cervical adenopathy.  Skin:    Findings: No erythema or rash.     Nails: There is no clubbing.  Neurological:     Mental Status: She is alert.     Diagnostics: none  Assessment and Plan:   1. Asthma, moderate persistent, well-controlled   2. Seasonal allergic rhinitis due to pollen   3. LPRD (laryngopharyngeal reflux disease)   4. Viral respiratory illness    1.  Allergen avoidance measures - pollens  2.  Treat and prevent inflammation:   A. Montelukast 10 mg - 1 tablet 1 time per day  B. Trelegy 200 - 1 inhalation 3-7 times per week   C. Nasacort - 1 spray each nostril 1-7 times per week  3. Treat and prevent LPR:   A. Pantoprazole 40 mg - 1 tablet in AM  B. Famotidine 40 mg - 1/2 - 1 tablet in PM  4. If needed:   A. Xyzal 5 mg - 1  tablet 1-2 times per day  B. Albuterol HFA - 2 inhalations every 4-6 hours  5. For this recent event:   A. Prednisone 10 mg - 1 tablet 1 time per day for 5-10 days  B. Ibuprofen  C. Nasal saline  D. Mucinex   5. Return to clinic in 6 months or earlier if problem  Rhonda [name] appears to have a viral respiratory tract infection and we will treat her with the therapy noted above which includes some  anti-inflammatory agents for her respiratory tract inflammation.  She will keep in contact me noting her response to this approach.  If she does well she will return for her previously arranged 34-monthvisit.  EAllena Katz MD Allergy / Immunology [facility]

## 2022-08-17 NOTE — Patient Instructions (Addendum)
  1.  Allergen avoidance measures - pollens  2.  Treat and prevent inflammation:   A. Montelukast 10 mg - 1 tablet 1 time per day  B. Trelegy 200 - 1 inhalation 3-7 times per week   C. Nasacort - 1 spray each nostril 1-7 times per week  3. Treat and prevent LPR:   A. Pantoprazole 40 mg - 1 tablet in AM  B. Famotidine 40 mg - 1/2 - 1 tablet in PM  4. If needed:   A. Xyzal 5 mg - 1 tablet 1-2 times per day  B. Albuterol HFA - 2 inhalations every 4-6 hours  5. For this recent event:   A. Prednisone 10 mg - 1 tablet 1 time per day for 5-10 days  B. Ibuprofen  C. Nasal saline  D. Mucinex   6. Return to clinic in 6 months or earlier if problem

## 2022-08-23 ENCOUNTER — Other Ambulatory Visit: Payer: Self-pay | Admitting: Internal Medicine

## 2022-08-23 DIAGNOSIS — Z1231 Encounter for screening mammogram for malignant neoplasm of breast: Secondary | ICD-10-CM

## 2022-08-28 ENCOUNTER — Other Ambulatory Visit: Payer: Self-pay | Admitting: Allergy and Immunology

## 2022-08-28 ENCOUNTER — Other Ambulatory Visit: Payer: Self-pay

## 2022-08-28 DIAGNOSIS — I10 Essential (primary) hypertension: Secondary | ICD-10-CM

## 2022-08-28 MED ORDER — BISOPROLOL-HYDROCHLOROTHIAZIDE 5-6.25 MG PO TABS: ORAL_TABLET | ORAL | 3 refills | Status: DC

## 2022-08-31 DIAGNOSIS — H43391 Other vitreous opacities, right eye: Secondary | ICD-10-CM | POA: Diagnosis not present

## 2022-09-01 DIAGNOSIS — H33311 Horseshoe tear of retina without detachment, right eye: Secondary | ICD-10-CM | POA: Diagnosis not present

## 2022-09-01 DIAGNOSIS — H4311 Vitreous hemorrhage, right eye: Secondary | ICD-10-CM | POA: Diagnosis not present

## 2022-09-01 DIAGNOSIS — H2513 Age-related nuclear cataract, bilateral: Secondary | ICD-10-CM | POA: Diagnosis not present

## 2022-09-01 DIAGNOSIS — H43813 Vitreous degeneration, bilateral: Secondary | ICD-10-CM | POA: Diagnosis not present

## 2022-09-15 DIAGNOSIS — H43813 Vitreous degeneration, bilateral: Secondary | ICD-10-CM | POA: Diagnosis not present

## 2022-09-15 DIAGNOSIS — H2513 Age-related nuclear cataract, bilateral: Secondary | ICD-10-CM | POA: Diagnosis not present

## 2022-09-15 DIAGNOSIS — H4311 Vitreous hemorrhage, right eye: Secondary | ICD-10-CM | POA: Diagnosis not present

## 2022-09-15 DIAGNOSIS — H31091 Other chorioretinal scars, right eye: Secondary | ICD-10-CM | POA: Diagnosis not present

## 2022-09-27 DIAGNOSIS — H43391 Other vitreous opacities, right eye: Secondary | ICD-10-CM | POA: Diagnosis not present

## 2022-10-11 ENCOUNTER — Ambulatory Visit
Admission: RE | Admit: 2022-10-11 | Discharge: 2022-10-11 | Disposition: A | Discharge status: Home / Self Care | Payer: BC Managed Care – PPO | Source: Ambulatory Visit | Attending: Internal Medicine | Admitting: Internal Medicine

## 2022-10-11 DIAGNOSIS — Z1231 Encounter for screening mammogram for malignant neoplasm of breast: Secondary | ICD-10-CM

## 2022-10-16 ENCOUNTER — Encounter: Payer: Self-pay | Admitting: Nurse Practitioner

## 2022-10-16 ENCOUNTER — Ambulatory Visit: Payer: BC Managed Care – PPO | Admitting: Nurse Practitioner

## 2022-10-16 VITALS — BP 116/74 | HR 69 | Temp 97.2°F | Ht 64.0 in | Wt 202.6 lb

## 2022-10-16 DIAGNOSIS — I10 Essential (primary) hypertension: Secondary | ICD-10-CM

## 2022-10-16 DIAGNOSIS — Z79899 Other long term (current) drug therapy: Secondary | ICD-10-CM

## 2022-10-16 DIAGNOSIS — E782 Mixed hyperlipidemia: Secondary | ICD-10-CM | POA: Diagnosis not present

## 2022-10-16 DIAGNOSIS — Z9109 Other allergy status, other than to drugs and biological substances: Secondary | ICD-10-CM

## 2022-10-16 DIAGNOSIS — R7309 Other abnormal glucose: Secondary | ICD-10-CM

## 2022-10-16 DIAGNOSIS — K219 Gastro-esophageal reflux disease without esophagitis: Secondary | ICD-10-CM

## 2022-10-16 DIAGNOSIS — J45909 Unspecified asthma, uncomplicated: Secondary | ICD-10-CM

## 2022-10-16 DIAGNOSIS — E559 Vitamin D deficiency, unspecified: Secondary | ICD-10-CM

## 2022-10-16 DIAGNOSIS — E669 Obesity, unspecified: Secondary | ICD-10-CM

## 2022-10-16 NOTE — Progress Notes (Signed)
FOLLOW UP  Assessment and Plan:   Asthma Follows with Dr. Carmelina Peal Allergy Specialist Well controlled on current regimen Continue meds, avoid triggers  Hypertension Discussed DASH (Dietary Approaches to Stop Hypertension) DASH diet is lower in sodium than a typical American diet. Cut back on foods that are high in saturated fat, cholesterol, and trans fats. Eat more whole-grain foods, fish, poultry, and nuts Remain active and exercise as tolerated daily.  Monitor BP at home-Call if greater than 130/80.  Check CMP/CBC   Cholesterol Discussed lifestyle modifications. Recommended diet heavy in fruits and veggies, omega 3's. Decrease consumption of animal meats, cheeses, and dairy products. Remain active and exercise as tolerated. Continue to monitor. Check lipids/TSH   Other abnormal glucose Recent A1Cs at goal Education: Reviewed 'ABCs' of diabetes management  Discussed goals to be met and/or maintained include A1C (<7) Blood pressure (<130/80) Cholesterol (LDL <70) Continue Eye Exam yearly  Continue Dental Exam Q6 mo Discussed dietary recommendations Discussed Physical Activity recommendations Check A1C   Morbid obesity ([place]) - BMI 35 with htn, hld Discussed appropriate BMI Diet modification. Physical activity. Encouraged/praised to build confidence.   Vitamin D Def  Continue supplementation to maintain goal of 60-100 Monitor levels  GERD/ Hx of NSAID induced gastritis Continue medications. No suspected reflux complications ([place]/stricture). Lifestyle modification:  wt loss, avoid meals 2-3h before bedtime. Consider eliminating food triggers:  chocolate, caffeine, EtOH, acid/spicy food.  Allergies/cough Follows with allergist Continue antihistamine Avoid triggers   Medication Management All medications discussed and reviewed in full. All questions and concerns regarding medications addressed.    Orders Placed This Encounter  Procedures   CBC with  Differential/Platelet   COMPLETE METABOLIC PANEL WITH GFR   Lipid panel   Hemoglobin A1c    Notify office for further evaluation and treatment, questions or concerns if any reported s/s fail to improve.   The patient was advised to call back or seek an in-person evaluation if any symptoms worsen or if the condition fails to improve as anticipated.   Further disposition pending results of labs. Discussed med's effects and SE's.    I discussed the assessment and treatment plan with the patient. The patient was provided an opportunity to ask questions and all were answered. The patient agreed with the plan and demonstrated an understanding of the instructions.  Discussed med's effects and SE's. Screening labs and tests as requested with regular follow-up as recommended.  I provided 25 minutes of face-to-face time during this encounter including counseling, chart review, and critical decision making was preformed.   Future Appointments  Date Time Provider [place]  10/25/2022  8:30 AM Kozlow, Donnamarie Poag, MD [facility] None  12/28/2022  3:00 PM Unk Pinto, MD [facility] None    ----------------------------------------------------------------------------------------------------------------------  HPI 62 y.o. female  presents for 3 month follow up on hypertension, cholesterol, glucose management, obesity and vitamin D deficiency.   Overall she reports feeling well.  She is enjoying playing [employer] weekly.  She notes that she recently saw [facility] for right eye retinal detachment.  Had this repaired and is now seeing well with some occasional floaters.  She has asthma currently well controlled on Symbicort and Singulair. She uses albuterol rarely. Allergies, follows recently with Dr. Tina Griffiths. Has had persistent dry cough, secretions in AM. Taking xyxal. Has nasocort but doesn't like to use. Was recently placed on Pantoprazole for cough but no longer wants to  take d/t increase r/f malabsorption.   she has a diagnosis of NSAID  gastritis (per 2017 EGD).   Screening for Colon Cancer Completed Colonoscopy 03/06/22; benign neoplasm of sigmoid colon. Recall 5 years  She has bilateral thumb OA and is seeing Dr. Fredna Dow, she is on celebrex PRN at this time. Her pain is somewhat improved and monitoring for now, wants to avoid surgery.  She will continue Famotidine.    BMI is Body mass index is 34.78 kg/m., she has been working on diet and exercise.  Wt Readings from Last 3 Encounters:  10/16/22 202 lb 9.6 oz (91.9 kg)  07/10/22 200 lb (90.7 kg)  04/03/22 198 lb (89.8 kg)    She is on cholesterol medication (pravastatin 40 mg daily) and denies myalgias. Her cholesterol is at goal. The cholesterol last visit was:   Lab Results  Component Value Date   CHOL 167 07/10/2022   HDL 46 (L) 07/10/2022   LDLCALC 94 07/10/2022   TRIG 177 (H) 07/10/2022   CHOLHDL 3.6 07/10/2022    She has been working on diet and exercise for glucose management, and denies foot ulcerations, increased appetite, nausea, paresthesia of the feet, polydipsia, polyuria, visual disturbances, vomiting and weight loss. Last A1C in the office was:  Lab Results  Component Value Date   HGBA1C 5.5 07/10/2022   She had new mild hypothyroid at last visit, denies fatigue, dry skin, hair loss, constipation.  Lab Results  Component Value Date   TSH 3.27 07/10/2022   Last GFR:  Lab Results  Component Value Date   GFRNONAA 71 01/24/2021   Patient is on Vitamin D supplement and at goal at recent check:    Lab Results  Component Value Date   VD25OH 72 07/10/2022       Current Medications:  Current Outpatient Medications on File Prior to Visit  Medication Sig   albuterol (VENTOLIN HFA) 108 (90 Base) MCG/ACT inhaler Can inhale two puffs every four to six hours as needed for cough or wheeze.   aspirin 81 MG tablet Take 81 mg by mouth daily.   bisoprolol-hydrochlorothiazide (ZIAC) 5-6.25  MG tablet TAKE ONE TABLET BY MOUTH EVERY DAY FOR BLOOD PRESSURE   CALCIUM-MAGNESIUM-VITAMIN D PO Take by mouth daily.   celecoxib (CELEBREX) 200 MG capsule Take  1 capsule  Daily  with Food for Pain & Inflammation                                       /                                           TAKE                                              BY                                            MOUTH   famotidine (PEPCID) 40 MG tablet TAKE ONE TABLET BY MOUTH AT BEDTIME   levocetirizine (XYZAL) 5 MG tablet 1 tablet 1-2 times a day as needed   montelukast (SINGULAIR) 10 MG tablet  TAKE ONE TABLET BY MOUTH AT BEDTIME   Multiple Vitamin (MULTIVITAMIN PO) Take by mouth daily.   olmesartan (BENICAR) 40 MG tablet TAKE ONE TABLET BY MOUTH AT BEDTIME FOR BLOOD PRESSURE   pantoprazole (PROTONIX) 40 MG tablet TAKE ONE TABLET BY MOUTH IN THE AM   pravastatin (PRAVACHOL) 40 MG tablet Take 1 tab at Bedtime  for Cholesterol.   [place] 200-62.5-25 MCG/ACT AEPB INHALE 1 PUFF BY MOUTH EVERY DAY. RINSE MOUTH AFTER USE   No current facility-administered medications on file prior to visit.     Allergies:  Allergies  Allergen Reactions   Ace Inhibitors     cough   Azithromycin     rash   Prednisone     High dose   Vicodin [Hydrocodone-Acetaminophen]     Nausea/vomiting     Medical History:  Past Medical History:  Diagnosis Date   Allergy    Asthma    Eczema    History of COVID-19 03/01/2020   [facility]-01/14/20    Hyperlipidemia    Hypertension    Migraine    NSAID induced gastritis 03/01/2020   Obesity    Prediabetes    Vitamin D deficiency    Family history- Reviewed and unchanged Social history- Reviewed and unchanged   Review of Systems:  Review of Systems  Constitutional:  Negative for malaise/fatigue and weight loss.  HENT:  Negative for congestion, hearing loss, sinus pain and tinnitus.   Eyes:  Negative for blurred vision and double vision.  Respiratory:   Positive for cough (AM, productive, chronic with allergies, stable). Negative for shortness of breath and wheezing.   Cardiovascular:  Negative for chest pain, palpitations, orthopnea, claudication and leg swelling.  Gastrointestinal:  Negative for abdominal pain, blood in stool, constipation, diarrhea, heartburn, melena, nausea and vomiting.  Genitourinary: Negative.   Musculoskeletal:  Positive for joint pain (bil thumbs). Negative for falls, myalgias and neck pain.  Skin:  Negative for rash.  Neurological:  Negative for dizziness, tingling, sensory change, weakness and headaches.  Endo/Heme/Allergies:  Positive for environmental allergies. Negative for polydipsia.  Psychiatric/Behavioral: Negative.    All other systems reviewed and are negative.   Physical Exam: BP 116/74   Pulse 69   Temp (!) 97.2 F (36.2 C)   Ht '5\' 4"'$  (1.626 m)   Wt 202 lb 9.6 oz (91.9 kg)   SpO2 98%   BMI 34.78 kg/m  Wt Readings from Last 3 Encounters:  10/16/22 202 lb 9.6 oz (91.9 kg)  07/10/22 200 lb (90.7 kg)  04/03/22 198 lb (89.8 kg)   General Appearance: Well nourished, in no apparent distress. Eyes: PERRLA, EOMs, conjunctiva no swelling or erythema Sinuses: No Frontal/maxillary tenderness ENT/Mouth: Ext aud canals clear, TMs without erythema, bulging. No erythema, swelling, or exudate on post pharynx.  Tonsils not swollen or erythematous. Hearing normal. Mildly hoarse vocal quality.  Neck: Supple, thyroid normal.  Respiratory: Respiratory effort normal, BS equal bilaterally without rales, rhonchi, wheezing or stridor.  Cardio: RRR with no MRGs. Brisk peripheral pulses without edema.  Abdomen: Soft, + BS.  Non-tender, no guarding, rebound, hernias, masses. Lymphatics: Non tender without lymphadenopathy.  Musculoskeletal: Full ROM without effusion, she does have DIP joint bony enlargement and mild deviations. Normal gait.   Skin: Warm, dry without rashes, lesions, ecchymosis.  Neuro: Cranial nerves  intact. No cerebellar symptoms.  Psych: Awake and oriented X 3, normal affect, Insight and Judgment appropriate.    Rhonda Jump, NP 4:22 PM [facility]

## 2022-10-16 NOTE — Patient Instructions (Signed)

## 2022-10-17 LAB — COMPLETE METABOLIC PANEL WITH GFR
AG Ratio: 2.3 (calc) (ref 1.0–2.5)
ALT: 17 U/L (ref 6–29)
AST: 21 U/L (ref 10–35)
Albumin: 4.6 g/dL (ref 3.6–5.1)
Alkaline phosphatase (APISO): 71 U/L (ref 37–153)
BUN: 24 mg/dL (ref 7–25)
CO2: 28 mmol/L (ref 20–32)
Calcium: 10.1 mg/dL (ref 8.6–10.4)
Chloride: 107 mmol/L (ref 98–110)
Creat: 0.9 mg/dL (ref 0.50–1.05)
Globulin: 2 g/dL (calc) (ref 1.9–3.7)
Glucose, Bld: 89 mg/dL (ref 65–99)
Potassium: 4.7 mmol/L (ref 3.5–5.3)
Sodium: 142 mmol/L (ref 135–146)
Total Bilirubin: 0.3 mg/dL (ref 0.2–1.2)
Total Protein: 6.6 g/dL (ref 6.1–8.1)
eGFR: 72 mL/min/{1.73_m2} (ref >=60)

## 2022-10-17 LAB — CBC WITH DIFFERENTIAL/PLATELET
Absolute Monocytes: 518 cells/uL (ref 200–950)
Basophils Absolute: 91 cells/uL (ref 0–200)
Basophils Relative: 1.3 %
Eosinophils Absolute: 182 cells/uL (ref 15–500)
Eosinophils Relative: 2.6 %
HCT: 39.3 % (ref 35.0–45.0)
Hemoglobin: 13.6 g/dL (ref 11.7–15.5)
Lymphs Abs: 1855 cells/uL (ref 850–3900)
MCH: 31.6 pg (ref 27.0–33.0)
MCHC: 34.6 g/dL (ref 32.0–36.0)
MCV: 91.2 fL (ref 80.0–100.0)
MPV: 11.9 fL (ref 7.5–12.5)
Monocytes Relative: 7.4 %
Neutro Abs: 4354 cells/uL (ref 1500–7800)
Neutrophils Relative %: 62.2 %
Platelets: 213 10*3/uL (ref 140–400)
RBC: 4.31 10*6/uL (ref 3.80–5.10)
RDW: 12.3 % (ref 11.0–15.0)
Total Lymphocyte: 26.5 %
WBC: 7 10*3/uL (ref 3.8–10.8)

## 2022-10-17 LAB — LIPID PANEL
Cholesterol: 173 mg/dL (ref <200)
HDL: 46 mg/dL — ABNORMAL LOW (ref >=50)
LDL Cholesterol (Calc): 84 mg/dL (calc)
Non-HDL Cholesterol (Calc): 127 mg/dL (calc) (ref <130)
Total CHOL/HDL Ratio: 3.8 (calc) (ref <5.0)
Triglycerides: 357 mg/dL — ABNORMAL HIGH (ref <150)

## 2022-10-17 LAB — HEMOGLOBIN A1C
Hgb A1c MFr Bld: 5.5 % of total Hgb (ref <5.7)
Mean Plasma Glucose: 111 mg/dL
eAG (mmol/L): 6.2 mmol/L

## 2022-10-23 ENCOUNTER — Other Ambulatory Visit: Payer: Self-pay | Admitting: Allergy and Immunology

## 2022-10-25 ENCOUNTER — Ambulatory Visit: Payer: BC Managed Care – PPO | Admitting: Allergy and Immunology

## 2022-10-25 ENCOUNTER — Encounter: Payer: Self-pay | Admitting: Allergy and Immunology

## 2022-10-25 VITALS — BP 110/70 | HR 70 | Resp 18

## 2022-10-25 DIAGNOSIS — J4540 Moderate persistent asthma, uncomplicated: Secondary | ICD-10-CM

## 2022-10-25 DIAGNOSIS — J301 Allergic rhinitis due to pollen: Secondary | ICD-10-CM

## 2022-10-25 DIAGNOSIS — K219 Gastro-esophageal reflux disease without esophagitis: Secondary | ICD-10-CM

## 2022-10-25 NOTE — Progress Notes (Unsigned)
[place]   Follow-up Note  Referring Provider: Unk Pinto, MD Primary Provider: Unk Pinto, MD Date of Office Visit: 10/25/2022  Subjective:   Rhonda [name] (DOB: 12/05/1960) is a 62 y.o. female who returns to the [facility] on 10/25/2022 in re-evaluation of the following:  HPI: Rhonda [name] returns to this clinic in evaluation of asthma, allergic rhinitis, LPR.  I last saw her in this clinic 17 August 2022.  She has done very well with her airway although she has had a little bit more sneezing and a little bit more of a cough just in the past several weeks.  But overall she feels as though she has her respiratory tract issue under pretty good control and she never uses a short acting bronchodilator and she can exert herself without any problem and she has not required a systemic steroid or an antibiotic for any type of airway issue while she remains on trilogy a few times per week and a nasal steroid a few times per week.  Her reflux is under very good control at this point in time with a combination of a proton pump inhibitor and H2 receptor blocker.  Allergies as of 10/25/2022       Reactions   Ace Inhibitors    cough   Azithromycin    rash   Prednisone    High dose   Vicodin [hydrocodone-acetaminophen]    Nausea/vomiting        Medication List    albuterol 108 (90 Base) MCG/ACT inhaler Commonly known as: VENTOLIN HFA Can inhale two puffs every four to six hours as needed for cough or wheeze.   aspirin 81 MG tablet Take 81 mg by mouth daily.   bisoprolol-hydrochlorothiazide 5-6.25 MG tablet Commonly known as: ZIAC TAKE ONE TABLET BY MOUTH EVERY DAY FOR BLOOD PRESSURE   CALCIUM-MAGNESIUM-VITAMIN D PO Take by mouth daily.   celecoxib 200 MG capsule Commonly known as: CELEBREX Take  1 capsule  Daily  with Food for Pain & Inflammation                                       /                                            TAKE                                              BY                                            MOUTH   famotidine 40 MG tablet Commonly known as: PEPCID TAKE ONE TABLET BY MOUTH AT BEDTIME   levocetirizine 5 MG tablet Commonly known as: XYZAL 1 tablet 1-2 times a day as needed   montelukast 10 MG tablet Commonly known as: SINGULAIR TAKE ONE TABLET BY MOUTH AT BEDTIME   MULTIVITAMIN PO Take by mouth daily.   olmesartan 40 MG tablet Commonly known as: BENICAR TAKE  ONE TABLET BY MOUTH AT BEDTIME FOR BLOOD PRESSURE   pantoprazole 40 MG tablet Commonly known as: PROTONIX TAKE ONE TABLET BY MOUTH IN THE AM   pravastatin 40 MG tablet Commonly known as: PRAVACHOL Take 1 tab at Bedtime  for Cholesterol.   Trelegy Ellipta 200-62.5-25 MCG/ACT Aepb Generic drug: Fluticasone-Umeclidin-Vilant INHALE 1 PUFF BY MOUTH EVERY DAY. RINSE MOUTH AFTER USE    Past Medical History:  Diagnosis Date   Allergy    Asthma    Eczema    History of COVID-19 03/01/2020   [facility]-01/14/20    Hyperlipidemia    Hypertension    Migraine    NSAID induced gastritis 03/01/2020   Obesity    Prediabetes    Vitamin D deficiency     Past Surgical History:  Procedure Laterality Date   DILATION AND CURETTAGE, DIAGNOSTIC / THERAPEUTIC  2009   ENDOMETRIAL ABLATION     LYMPH NODE BIOPSY Left 1991   negative   NEVUS EXCISION  2012   dysplastic from back    Review of systems negative except as noted in HPI / PMHx or noted below:  Review of Systems  Constitutional: Negative.   HENT: Negative.    Eyes: Negative.   Respiratory: Negative.    Cardiovascular: Negative.   Gastrointestinal: Negative.   Genitourinary: Negative.   Musculoskeletal: Negative.   Skin: Negative.   Neurological: Negative.   Endo/Heme/Allergies: Negative.   Psychiatric/Behavioral: Negative.       Objective:   Vitals:   10/25/22 0833  BP: 110/70  Pulse: 70  Resp: 18  SpO2:  92%          Physical Exam Constitutional:      Appearance: She is not diaphoretic.  HENT:     Head: Normocephalic.     Right Ear: Tympanic membrane, ear canal and external ear normal.     Left Ear: Tympanic membrane, ear canal and external ear normal.     Nose: Nose normal. No mucosal edema or rhinorrhea.     Mouth/Throat:     Pharynx: Uvula midline. No oropharyngeal exudate.  Eyes:     Conjunctiva/sclera: Conjunctivae normal.  Neck:     Thyroid: No thyromegaly.     Trachea: Trachea normal. No tracheal tenderness or tracheal deviation.  Cardiovascular:     Rate and Rhythm: Normal rate and regular rhythm.     Heart sounds: Normal heart sounds, S1 normal and S2 normal. No murmur heard. Pulmonary:     Effort: No respiratory distress.     Breath sounds: Normal breath sounds. No stridor. No wheezing or rales.  Lymphadenopathy:     Head:     Right side of head: No tonsillar adenopathy.     Left side of head: No tonsillar adenopathy.     Cervical: No cervical adenopathy.  Skin:    Findings: No erythema or rash.     Nails: There is no clubbing.  Neurological:     Mental Status: She is alert.     Diagnostics:    Spirometry was performed and demonstrated an FEV1 of 2.68 at 115 % of predicted.  Assessment and Plan:   1. Asthma, moderate persistent, well-controlled   2. Seasonal allergic rhinitis due to pollen   3. LPRD (laryngopharyngeal reflux disease)    1.  Allergen avoidance measures - pollens  2.  Treat and prevent inflammation:   A. Montelukast 10 mg - 1 tablet 1 time per day  B. Trelegy 200 - 1 inhalation  3-7 times per week   C. Nasacort - 1 spray each nostril 1-7 times per week  3. Treat and prevent LPR:   A. Pantoprazole 40 mg - 1 tablet in AM  B. Famotidine 40 mg - 1 tablet in PM  4. If needed:   A. Xyzal 5 mg - 1 tablet 1-2 times per day  B. Albuterol HFA - 2 inhalations every 4-6 hours  5. Return to clinic winter holiday or earlier if  problem  Cedrika appears to be doing quite well on her current therapy and I would like for her to remain on some dose of anti-inflammatory medications for both her upper and lower airway.  She has a good idea about her disease state and [name] to use her medications appropriately and appropriate dosing of her medications depending on disease activity and she can certainly go up to full dose should she develop problems as she moves through the spring season.  Assuming she does well I will see her back in this clinic winter or earlier if there is a problem.  Allena Katz, MD Allergy / Immunology [place]

## 2022-10-25 NOTE — Patient Instructions (Signed)
  1.  Allergen avoidance measures - pollens  2.  Treat and prevent inflammation:   A. Montelukast 10 mg - 1 tablet 1 time per day  B. Trelegy 200 - 1 inhalation 3-7 times per week   C. Nasacort - 1 spray each nostril 1-7 times per week  3. Treat and prevent LPR:   A. Pantoprazole 40 mg - 1 tablet in AM  B. Famotidine 40 mg - 1 tablet in PM  4. If needed:   A. Xyzal 5 mg - 1 tablet 1-2 times per day  B. Albuterol HFA - 2 inhalations every 4-6 hours  5. Return to clinic winter holiday or earlier if problem

## 2022-10-26 ENCOUNTER — Encounter: Payer: Self-pay | Admitting: Allergy and Immunology

## 2022-11-13 ENCOUNTER — Other Ambulatory Visit: Payer: Self-pay | Admitting: Allergy and Immunology

## 2022-11-13 DIAGNOSIS — K219 Gastro-esophageal reflux disease without esophagitis: Secondary | ICD-10-CM

## 2022-12-14 DIAGNOSIS — H4311 Vitreous hemorrhage, right eye: Secondary | ICD-10-CM | POA: Diagnosis not present

## 2022-12-14 DIAGNOSIS — H31091 Other chorioretinal scars, right eye: Secondary | ICD-10-CM | POA: Diagnosis not present

## 2022-12-14 DIAGNOSIS — H43813 Vitreous degeneration, bilateral: Secondary | ICD-10-CM | POA: Diagnosis not present

## 2022-12-25 ENCOUNTER — Other Ambulatory Visit: Payer: Self-pay | Admitting: Nurse Practitioner

## 2022-12-25 ENCOUNTER — Other Ambulatory Visit: Payer: Self-pay | Admitting: Internal Medicine

## 2022-12-25 DIAGNOSIS — I10 Essential (primary) hypertension: Secondary | ICD-10-CM

## 2022-12-27 ENCOUNTER — Encounter: Payer: BC Managed Care – PPO | Admitting: Internal Medicine

## 2022-12-28 ENCOUNTER — Encounter: Payer: BC Managed Care – PPO | Admitting: Internal Medicine

## 2023-01-29 ENCOUNTER — Encounter: Payer: BC Managed Care – PPO | Admitting: Internal Medicine

## 2023-02-07 ENCOUNTER — Encounter: Payer: BC Managed Care – PPO | Admitting: Internal Medicine

## 2023-03-05 ENCOUNTER — Other Ambulatory Visit: Payer: Self-pay | Admitting: Allergy and Immunology

## 2023-03-27 ENCOUNTER — Other Ambulatory Visit: Payer: Self-pay | Admitting: Internal Medicine

## 2023-04-01 ENCOUNTER — Encounter: Payer: Self-pay | Admitting: Internal Medicine

## 2023-04-01 NOTE — Progress Notes (Unsigned)
Annual Screening/Preventative Visit &  Comprehensive Evaluation &  Examination   Future Appointments  Date Time Provider Department  04/02/2023                      cpe  3:00 PM Lucky Cowboy, MD [facility]  06/27/2023  8:30 AM Kozlow, Alvira Philips, MD [facility]  04/10/2024                        cpe  3:00 PM Lucky Cowboy, MD [facility]        This very nice 62 y.o. DWF   with  HTN, HLD, Prediabetes  and Vitamin D Deficiency presents for a Screening /Preventative Visit & comprehensive evaluation and management of multiple medical co-morbidities.  Patient's GERD is controlled w/Protonix.          HTN predates circa 2007. Patient's BP has been controlled at home and patient denies any cardiac symptoms as chest pain, palpitations, shortness of breath, dizziness or ankle swelling. Today's BP is at goal -                                .         Patient's hyperlipidemia is controlled with diet and Pravastatin. Patient denies myalgias or other medication SE's. Last lipids were at goal except elevated Trig's :  Lab Results  Component Value Date   CHOL 173 10/16/2022   HDL 46 (L) 10/16/2022   LDLCALC 84 10/16/2022   TRIG 357 (H) 10/16/2022   CHOLHDL 3.8 10/16/2022         Patient has  has Morbid obesity (BMI 32.6) and  prediabetes (A1c 5.7% w/ Insulin 37 /2012) and patient denies reactive hypoglycemic symptoms, visual blurring, diabetic polys or paresthesias. Last A1c was normal & at goal :  Lab Results  Component Value Date   HGBA1C 5.5 10/16/2022   Wt Readings from Last 3 Encounters:  10/16/22 202 lb 9.6 oz (91.9 kg)  07/10/22 200 lb (90.7 kg)  04/03/22 198 lb (89.8 kg)         Finally, patient has history of Vitamin D Deficiency ("22" /2008) and last Vitamin D was at goal :   Lab Results  Component Value Date   VD25OH 72 07/10/2022       Current Outpatient Medications on File Prior to Visit  Medication Sig   albuterol HFA inhaler two puffs every 4-6 hours as  needed    aspirin 81 MG tablet Take daily.   bisoprolol-hctz 5-6.25 MG tablet TAKE ONE TABLET  EVERY DAY    CALCIUM-MAGNESIUM-VIT D  Take daily.   celecoxib  100 MG capsule Take  daily as needed.   VITAMIN D  Take 4,000 Units  daily.    famotidine 40 MG tablet Take 1 tablet  at bedtime.   levocetirizine  5 MG tablet 1 tablet 1-2 times a day as needed   montelukast 10 MG tablet TAKE ONE TABLET  AT BEDTIME   Multiple Vitamin  Take  daily.   olmesartan  40 MG tablet TAKE ONE TABLET  AT BEDTIME   pantoprazole  40 MG tablet Take one tablet daily   pravastatin 40 MG tablet Take 1 tab daily for cholesterol.   TRELEGY  200-62.5-25  INHALE 1 PUFF EVERY DAY     Allergies  Allergen Reactions   Ace Inhibitors     cough  Azithromycin     rash   Prednisone     High dose   Vicodin [Hydrocodone-Acetaminophen]     Nausea/vomiting     Past Medical History:  Diagnosis Date   Allergy    Asthma    Eczema    History of COVID-19 03/01/2020   [facility]-01/14/20    Hyperlipidemia    Hypertension    Migraine    NSAID induced gastritis 03/01/2020   Obesity    Prediabetes    Vitamin D deficiency      Health Maintenance  Topic Date Due   COVID-19 Vaccine (1) Never done   HIV Screening  Never done   Zoster Vaccines- Shingrix (1 of 2) Never done   PAP SMEAR-Modifier  12/27/2012   INFLUENZA VACCINE  03/07/2022   MAMMOGRAM  06/14/2023   TETANUS/TDAP  08/27/2028   Hepatitis C Screening  Completed   HPV VACCINES  Aged Out     Immunization History  Administered Date(s) Administered   PPD Test 08/03/2017, 08/27/2018, 12/21/2020   Pneumococcal-23 08/29/2009   Tdap 08/30/2007, 08/27/2018    Colon - 03/2016 Dr Charm Barges - recc 5 yr f/u   Last Colon - 03/10/2022 - Dr Charm Barges  - Recommended 5 year f/u   Last MGM - 10/16/2022   Past Surgical History:  Procedure Laterality Date   DILATION AND CURETTAGE, DIAGNOSTIC / THERAPEUTIC  2009   ENDOMETRIAL ABLATION     LYMPH  NODE BIOPSY Left 1991   negative   NEVUS EXCISION  2012   dysplastic from back     Family History  Problem Relation Age of Onset   Hypertension Mother    COPD Father    Cancer Father        colon   Heart disease Father    Hyperlipidemia Father    Hypertension Father    Stroke Father    Allergic rhinitis Neg Hx    Angioedema Neg Hx    Asthma Neg Hx    Atopy Neg Hx    Eczema Neg Hx    Immunodeficiency Neg Hx    Urticaria Neg Hx      Social History   Tobacco Use   Smoking status: Never   Smokeless tobacco: Never  Substance Use Topics   Alcohol use: No   Drug use: No      ROS Constitutional: Denies fever, chills, weight loss/gain, headaches, insomnia,  night sweats, and change in appetite. Does c/o fatigue. Eyes: Denies redness, blurred vision, diplopia, discharge, itchy, watery eyes.  ENT: Denies discharge, congestion, post nasal drip, epistaxis, sore throat, earache, hearing loss, dental pain, Tinnitus, Vertigo, Sinus pain, snoring.  Cardio: Denies chest pain, palpitations, irregular heartbeat, syncope, dyspnea, diaphoresis, orthopnea, PND, claudication, edema Respiratory: denies cough, dyspnea, DOE, pleurisy, hoarseness, laryngitis, wheezing.  Gastrointestinal: Denies dysphagia, heartburn, reflux, water brash, pain, cramps, nausea, vomiting, bloating, diarrhea, constipation, hematemesis, melena, hematochezia, jaundice, hemorrhoids Genitourinary: Denies dysuria, frequency, urgency, nocturia, hesitancy, discharge, hematuria, flank pain Breast: Breast lumps, nipple discharge, bleeding.  Musculoskeletal: Denies arthralgia, myalgia, stiffness, Jt. Swelling, pain, limp, and strain/sprain. Denies falls. Skin: Denies puritis, rash, hives, warts, acne, eczema, changing in skin lesion Neuro: No weakness, tremor, incoordination, spasms, paresthesia, pain Psychiatric: Denies confusion, memory loss, sensory loss. Denies Depression. Endocrine: Denies change in weight, skin, hair  change, nocturia, and paresthesia, diabetic polys, visual blurring, hyper / hypo glycemic episodes.  Heme/Lymph: No excessive bleeding, bruising, enlarged lymph nodes.  Physical Exam  There were no vitals taken for this  visit.  General Appearance: Well nourished, well groomed and in no apparent distress.  Eyes: PERRLA, EOMs, conjunctiva no swelling or erythema, normal fundi and vessels. Sinuses: No frontal/maxillary tenderness ENT/Mouth: EACs patent / TMs  nl. Nares clear without erythema, swelling, mucoid exudates. Oral hygiene is good. No erythema, swelling, or exudate. Tongue normal, non-obstructing. Tonsils not swollen or erythematous. Hearing normal.  Neck: Supple, thyroid not palpable. No bruits, nodes or JVD. Respiratory: Respiratory effort normal.  BS equal and clear bilateral without rales, rhonci, wheezing or stridor. Cardio: Heart sounds are normal with regular rate and rhythm and no murmurs, rubs or gallops. Peripheral pulses are normal and equal bilaterally without edema. No aortic or femoral bruits. Chest: symmetric with normal excursions and percussion. Breasts: Deferred to MGM Abdomen: Flat, soft with bowel sounds active. Nontender, no guarding, rebound, hernias, masses, or organomegaly.  Lymphatics: Non tender without lymphadenopathy.  Musculoskeletal: Full ROM all peripheral extremities, joint stability, 5/5 strength, and normal gait. Skin: Warm and dry without rashes, lesions, cyanosis, clubbing or  ecchymosis.  Neuro: Cranial nerves intact, reflexes equal bilaterally. Normal muscle tone, no cerebellar symptoms. Sensation intact.  Pysch: Alert and oriented X 3, normal affect, Insight and Judgment appropriate.    Assessment and Plan  1. Annual Preventative Screening Examination   2. Essential hypertension  - EKG 12-Lead - [place], RETROPERITNL ABD,  LTD - Urinalysis, Routine w reflex microscopic - Microalbumin / creatinine urine ratio - CBC with  Differential/Platelet - COMPLETE METABOLIC PANEL WITH GFR - Magnesium - TSH   3. Hyperlipidemia, mixed  - EKG 12-Lead - [place], RETROPERITNL ABD,  LTD - Lipid panel - TSH   4. Abnormal glucose  - EKG 12-Lead - [place], RETROPERITNL ABD,  LTD - Hemoglobin A1c - Insulin, random   5. Vitamin D deficiency  - VITAMIN D 25 Hydroxy    6. Class 1 obesity due to excess calories with serious comorbidity  and body mass index (BMI) of 34.0 to 34.9 in adult  - TSH   7. Screening for colorectal cancer  - POC Hemoccult Bld/Stl    8. Screening-pulmonary TB  - TB Skin Test   9. Screening for heart disease  - EKG 12-Lead   10. FHx: heart disease  - EKG 12-Lead - [place], RETROPERITNL ABD,  LTD   11. Screening for AAA (aortic abdominal aneurysm)  - [place], RETROPERITNL ABD,  LTD   12. Fatigue, unspecified type  - Iron, Total/Total Iron Binding Cap - Vitamin B12 - CBC with Differential/Platelet - TSH   13. Medication management  - Urinalysis, Routine w reflex microscopic - Microalbumin / creatinine urine ratio - CBC with Differential/Platelet - COMPLETE METABOLIC PANEL WITH GFR - Magnesium - Lipid panel - TSH - Hemoglobin A1c - Insulin, random - VITAMIN D 25 Hydroxy          Patient was counseled in prudent diet to achieve/maintain BMI less than 25 for weight control, BP monitoring, regular exercise and medications. Discussed med's effects and SE's. Screening labs and tests as requested with regular follow-up as recommended. Over 40 minutes of exam, counseling, chart review and high complex critical decision making was performed.   Marinus Maw, MD

## 2023-04-01 NOTE — Patient Instructions (Signed)
Due to recent changes in healthcare laws, you may see the results of your imaging and laboratory studies on MyChart before your provider has had a chance to review them.  We understand that in some cases there may be results that are confusing or concerning to you. Not all laboratory results come back in the same time frame and the provider may be waiting for multiple results in order to interpret others.  Please give [place] 48 hours in order for your provider to thoroughly review all the results before contacting the office for clarification of your results.   ++++++++++++++++++++++++++++++  Vit D  & Vit C 1,000 mg   are recommended to help protect  against the Covid-19 and other Corona viruses.    Also it's recommended  to take  Zinc 50 mg  to help  protect against the Covid-19   and best place to get  is also on [email or web address]  and don't pay more than 6-8 cents /pill !  ================================ Coronavirus (COVID-19) Are you at risk?  Are you at risk for the Coronavirus (COVID-19)?  To be considered HIGH RISK for Coronavirus (COVID-19), you have to meet the following criteria:  Traveled to [place], [place], [place], [place] or [place]; or in the [place] to [place], [place], [place]  or [place]; and have fever, cough, and shortness of breath within the last 2 weeks of travel OR Been in close contact with a person diagnosed with COVID-19 within the last 2 weeks and have  fever, cough,and shortness of breath  IF YOU DO NOT MEET THESE CRITERIA, YOU ARE CONSIDERED LOW RISK FOR COVID-19.  What to do if you are HIGH RISK for COVID-19?  If you are having a medical emergency, call 911. Seek medical care right away. Before you go to a doctor's office, urgent care or emergency department,  call ahead and tell them about your recent travel, contact with someone diagnosed with COVID-19   and your symptoms.  You should receive instructions from your physician's office regarding  next steps of care.  When you arrive at healthcare provider, tell the healthcare staff immediately you have returned from  visiting [place], [place], [place], [place] or [place]; or traveled in the [place] to [place], [place],  [place] or [place] in the last two weeks or you have been in close contact with a person diagnosed with  COVID-19 in the last 2 weeks.   Tell the health care staff about your symptoms: fever, cough and shortness of breath. After you have been seen by a medical provider, you will be either: Tested for (COVID-19) and discharged home on quarantine except to seek medical care if  symptoms worsen, and asked to  Stay home and avoid contact with others until you get your results (4-5 days)  Avoid travel on public transportation if possible (such as bus, train, or airplane) or Sent to the Emergency Department by EMS for evaluation, COVID-19 testing  and  possible admission depending on your condition and test results.  What to do if you are LOW RISK for COVID-19?  Reduce your risk of any infection by using the same precautions used for avoiding the common cold or flu:  Wash your hands often with soap and warm water for at least 20 seconds.  If soap and water are not readily available,  use an alcohol-based hand sanitizer with at least 60% alcohol.  If coughing or sneezing, cover your mouth and nose by coughing  or sneezing into the elbow areas of your shirt or coat,  into a tissue or into your sleeve (not your hands). Avoid shaking hands with others and consider head nods or verbal greetings only. Avoid touching your eyes, nose, or mouth with unwashed hands.  Avoid close contact with people who are sick. Avoid places or events with large numbers of people in one location, like concerts or sporting events. Carefully consider travel plans you have or are making. If you are planning any travel outside or inside the [place], visit the CDC's Travelers' Health webpage for  the latest health notices. If you have some symptoms but not all symptoms, continue to monitor at home and seek medical attention  if your symptoms worsen. If you are having a medical emergency, call 911. >>>>>>>>>>>>>>>>>>>>>>> Preventive Care for Adults  A healthy lifestyle and preventive care can promote health and wellness. Preventive health guidelines for women include the following key practices. A routine yearly physical is a good way to check with your health care provider about your health and preventive screening. It is a chance to share any concerns and updates on your health and to receive a thorough exam. Visit your dentist for a routine exam and preventive care every 6 months. Brush your teeth twice a day and floss once a day. Good oral hygiene prevents tooth decay and gum disease. The frequency of eye exams is based on your age, health, family medical history, use of contact lenses, and other factors. Follow your health care provider's recommendations for frequency of eye exams. Eat a healthy diet. Foods like vegetables, fruits, whole grains, low-fat dairy products, and lean protein foods contain the nutrients you need without too many calories. Decrease your intake of foods high in solid fats, added sugars, and salt. Eat the right amount of calories for you. Get information about a proper diet from your health care provider, if necessary. Regular physical exercise is one of the most important things you can do for your health. Most adults should get at least 150 minutes of moderate-intensity exercise (any activity that increases your heart rate and causes you to sweat) each week. In addition, most adults need muscle-strengthening exercises on 2 or more days a week. Maintain a healthy weight. The body mass index (BMI) is a screening tool to identify possible weight problems. It provides an estimate of body fat based on height and weight. Your health care provider can find your BMI and can  help you achieve or maintain a healthy weight. For adults 20 years and older: A BMI below 18.5 is considered underweight. A BMI of 18.5 to 24.9 is normal. A BMI of 25 to 29.9 is considered overweight. A BMI of 30 and above is considered obese. Maintain normal blood lipids and cholesterol levels by exercising and minimizing your intake of saturated fat. Eat a balanced diet with plenty of fruit and vegetables. Blood tests for lipids and cholesterol should begin at age 43 and be repeated every 5 years. If your lipid or cholesterol levels are high, you are over 50, or you are at high risk for heart disease, you may need your cholesterol levels checked more frequently. Ongoing high lipid and cholesterol levels should be treated with medicines if diet and exercise are not working. If you smoke, find out from your health care provider how to quit. If you do not use tobacco, do not start. Lung cancer screening is recommended for adults aged 11-80 years who are at high risk for developing  lung cancer because of a history of smoking. A yearly low-dose CT scan of the lungs is recommended for people who have at least a 30-pack-year history of smoking and are a current smoker or have quit within the past 15 years. A pack year of smoking is smoking an average of 1 pack of cigarettes a day for 1 year (for example: 1 pack a day for 30 years or 2 packs a day for 15 years). Yearly screening should continue until the smoker has stopped smoking for at least 15 years. Yearly screening should be stopped for people who develop a health problem that would prevent them from having lung cancer treatment. High blood pressure causes heart disease and increases the risk of stroke. Your blood pressure should be checked at least every 1 to 2 years. Ongoing high blood pressure should be treated with medicines if weight loss and exercise do not work. If you are 32-3 years old, ask your health care provider if you should take aspirin to  prevent strokes. Diabetes screening involves taking a blood sample to check your fasting blood sugar level. This should be done once every 3 years, after age 32, if you are within normal weight and without risk factors for diabetes. Testing should be considered at a younger age or be carried out more frequently if you are overweight and have at least 1 risk factor for diabetes. Breast cancer screening is essential preventive care for women. You should practice "breast self-awareness." This means understanding the normal appearance and feel of your breasts and may include breast self-examination. Any changes detected, no matter how small, should be reported to a health care provider. Women in their 45s and 30s should have a clinical breast exam (CBE) by a health care provider as part of a regular health exam every 1 to 3 years. After age 83, women should have a CBE every year. Starting at age 92, women should consider having a mammogram (breast X-ray test) every year. Women who have a family history of breast cancer should talk to their health care provider about genetic screening. Women at a high risk of breast cancer should talk to their health care providers about having an MRI and a mammogram every year. Breast cancer gene (BRCA)-related cancer risk assessment is recommended for women who have family members with BRCA-related cancers. BRCA-related cancers include breast, ovarian, tubal, and peritoneal cancers. Having family members with these cancers may be associated with an increased risk for harmful changes (mutations) in the breast cancer genes BRCA1 and BRCA2. Results of the assessment will determine the need for genetic counseling and BRCA1 and BRCA2 testing. Routine pelvic exams to screen for cancer are no longer recommended for nonpregnant women who are considered low risk for cancer of the pelvic organs (ovaries, uterus, and vagina) and who do not have symptoms. Ask your health care provider if a  screening pelvic exam is right for you. If you have had past treatment for cervical cancer or a condition that could lead to cancer, you need Pap tests and screening for cancer for at least 20 years after your treatment. If Pap tests have been discontinued, your risk factors (such as having a new sexual partner) need to be reassessed to determine if screening should be resumed. Some women have medical problems that increase the chance of getting cervical cancer. In these cases, your health care provider may recommend more frequent screening and Pap tests. Colorectal cancer can be detected and often prevented. Most routine colorectal  cancer screening begins at the age of 10 years and continues through age 19 years. However, your health care provider may recommend screening at an earlier age if you have risk factors for colon cancer. On a yearly basis, your health care provider may provide home test kits to check for hidden blood in the stool. Use of a small camera at the end of a tube, to directly examine the colon (sigmoidoscopy or colonoscopy), can detect the earliest forms of colorectal cancer. Talk to your health care provider about this at age 28, when routine screening begins.  Direct exam of the colon should be repeated every 5-10 years through age 71 years, unless early forms of pre-cancerous polyps or small growths are found. Hepatitis C blood testing is recommended for all people born from [birth year] through 1965 and any individual with known risks for hepatitis C.  Osteoporosis is a disease in which the bones lose minerals and strength with aging. This can result in serious bone fractures or breaks. The risk of osteoporosis can be identified using a bone density scan. Women ages 21 years and over and women at risk for fractures or osteoporosis should discuss screening with their health care providers. Ask your health care provider whether you should take a calcium supplement or vitamin D to reduce the rate  of osteoporosis. Menopause can be associated with physical symptoms and risks. Hormone replacement therapy is available to decrease symptoms and risks. You should talk to your health care provider about whether hormone replacement therapy is right for you. Use sunscreen. Apply sunscreen liberally and repeatedly throughout the day. You should seek shade when your shadow is [place] than you. Protect yourself by wearing long sleeves, pants, a wide-brimmed hat, and sunglasses year round, whenever you are outdoors. Once a month, do a whole body skin exam, using a mirror to look at the skin on your back. Tell your health care provider of new moles, moles that have irregular borders, moles that are larger than a pencil eraser, or moles that have changed in shape or color. Stay current with required vaccines (immunizations). Influenza vaccine. All adults should be immunized every year. Tetanus, diphtheria, and acellular pertussis (Td, Tdap) vaccine. Pregnant women should receive 1 dose of Tdap vaccine during each pregnancy. The dose should be obtained regardless of the length of time since the last dose. Immunization is preferred during the 27th-36th week of gestation. An adult who has not previously received Tdap or who does not know her vaccine status should receive 1 dose of Tdap. This initial dose should be followed by tetanus and diphtheria toxoids (Td) booster doses every 10 years. Adults with an unknown or incomplete history of completing a 3-dose immunization series with Td-containing vaccines should begin or complete a primary immunization series including a Tdap dose. Adults should receive a Td booster every 10 years. Varicella vaccine. An adult without evidence of immunity to varicella should receive 2 doses or a second dose if she has previously received 1 dose. Pregnant females who do not have evidence of immunity should receive the first dose after pregnancy. This first dose should be obtained before  leaving the health care facility. The second dose should be obtained 4-8 weeks after the first dose. Human papillomavirus (HPV) vaccine. Females aged 13-26 years who have not received the vaccine previously should obtain the 3-dose series. The vaccine is not recommended for use in pregnant females. However, pregnancy testing is not needed before receiving a dose. If a female is found to  be pregnant after receiving a dose, no treatment is needed. In that case, the remaining doses should be delayed until after the pregnancy. Immunization is recommended for any person with an immunocompromised condition through the age of 55 years if she did not get any or all doses earlier. During the 3-dose series, the second dose should be obtained 4-8 weeks after the first dose. The third dose should be obtained 24 weeks after the first dose and 16 weeks after the second dose. Zoster vaccine. One dose is recommended for adults aged 15 years or older unless certain conditions are present. Measles, mumps, and rubella (MMR) vaccine. Adults born before [birth year] generally are considered immune to measles and mumps. Adults born in [birth year] or later should have 1 or more doses of MMR vaccine unless there is a contraindication to the vaccine or there is laboratory evidence of immunity to each of the three diseases. A routine second dose of MMR vaccine should be obtained at least 28 days after the first dose for students attending postsecondary schools, health care workers, or international travelers. People who received inactivated measles vaccine or an unknown type of measles vaccine during 1963-1967 should receive 2 doses of MMR vaccine. People who received inactivated mumps vaccine or an unknown type of mumps vaccine before 1979 and are at high risk for mumps infection should consider immunization with 2 doses of MMR vaccine. For females of childbearing age, rubella immunity should be determined. If there is no evidence of immunity, females  who are not pregnant should be vaccinated. If there is no evidence of immunity, females who are pregnant should delay immunization until after pregnancy. Unvaccinated health care workers born before [birth year] who lack laboratory evidence of measles, mumps, or rubella immunity or laboratory confirmation of disease should consider measles and mumps immunization with 2 doses of MMR vaccine or rubella immunization with 1 dose of MMR vaccine. Pneumococcal 13-valent conjugate (PCV13) vaccine. When indicated, a person who is uncertain of her immunization history and has no record of immunization should receive the PCV13 vaccine. An adult aged 4 years or older who has certain medical conditions and has not been previously immunized should receive 1 dose of PCV13 vaccine. This PCV13 should be followed with a dose of pneumococcal polysaccharide (PPSV23) vaccine. The PPSV23 vaccine dose should be obtained at least 1 or more year(s) after the dose of PCV13 vaccine. An adult aged 15 years or older who has certain medical conditions and previously received 1 or more doses of PPSV23 vaccine should receive 1 dose of PCV13. The PCV13 vaccine dose should be obtained 1 or more years after the last PPSV23 vaccine dose.  Pneumococcal polysaccharide (PPSV23) vaccine. When PCV13 is also indicated, PCV13 should be obtained first. All adults aged 9 years and older should be immunized. An adult younger than age 14 years who has certain medical conditions should be immunized. Any person who resides in a nursing home or long-term care facility should be immunized. An adult smoker should be immunized. People with an immunocompromised condition and certain other conditions should receive both PCV13 and PPSV23 vaccines. People with human immunodeficiency virus (HIV) infection should be immunized as soon as possible after diagnosis. Immunization during chemotherapy or radiation therapy should be avoided. Routine use of PPSV23 vaccine is not  recommended for American Indians, [place] Natives, or people younger than 65 years unless there are medical conditions that require PPSV23 vaccine. When indicated, people who have unknown immunization and have no record of immunization should receive  PPSV23 vaccine. One-time revaccination 5 years after the first dose of PPSV23 is recommended for people aged 19-64 years who have chronic kidney failure, nephrotic syndrome, asplenia, or immunocompromised conditions. People who received 1-2 doses of PPSV23 before age 36 years should receive another dose of PPSV23 vaccine at age 52 years or later if at least 5 years have passed since the previous dose. Doses of PPSV23 are not needed for people immunized with PPSV23 at or after age 34 years.  Preventive Services / Frequency  Ages 74 to 1 years Blood pressure check. Lipid and cholesterol check. Lung cancer screening. / Every year if you are aged 53-80 years and have a 30-pack-year history of smoking and currently smoke or have quit within the past 15 years. Yearly screening is stopped once you have quit smoking for at least 15 years or develop a health problem that would prevent you from having lung cancer treatment. Clinical breast exam.** / Every year after age 16 years.  BRCA-related cancer risk assessment.** / For women who have family members with a BRCA-related cancer (breast, ovarian, tubal, or peritoneal cancers). Mammogram.** / Every year beginning at age 26 years and continuing for as long as you are in good health. Consult with your health care provider. Pap test.** / Every 3 years starting at age 19 years through age 52 or 29 years with a history of 3 consecutive normal Pap tests. HPV screening.** / Every 3 years from ages 18 years through ages 4 to 110 years with a history of 3 consecutive normal Pap tests. Fecal occult blood test (FOBT) of stool. / Every year beginning at age 70 years and continuing until age 53 years. You may not need to do this  test if you get a colonoscopy every 10 years. Flexible sigmoidoscopy or colonoscopy.** / Every 5 years for a flexible sigmoidoscopy or every 10 years for a colonoscopy beginning at age 66 years and continuing until age 44 years. Hepatitis C blood test.** / For all people born from [birth year] through 1967 and any individual with known risks for hepatitis C. Skin self-exam. / Monthly. Influenza vaccine. / Every year. Tetanus, diphtheria, and acellular pertussis (Tdap/Td) vaccine.** / Consult your health care provider. Pregnant women should receive 1 dose of Tdap vaccine during each pregnancy. 1 dose of Td every 10 years. Varicella vaccine.** / Consult your health care provider. Pregnant females who do not have evidence of immunity should receive the first dose after pregnancy. Zoster vaccine.** / 1 dose for adults aged 90 years or older. Pneumococcal 13-valent conjugate (PCV13) vaccine.** / Consult your health care provider. Pneumococcal polysaccharide (PPSV23) vaccine.** / 1 to 2 doses if you smoke cigarettes or if you have certain conditions. Meningococcal vaccine.** / Consult your health care provider. Hepatitis A vaccine.** / Consult your health care provider. Hepatitis B vaccine.** / Consult your health care provider. Screening for abdominal aortic aneurysm (AAA)  by ultrasound is recommended for people over 50 who have history of high blood pressure or who are current or former smokers. ++++++++++++++++++ Recommend Adult Low Dose Aspirin or  coated  Aspirin 81 mg daily  To reduce risk of Colon Cancer 40 %,  Skin Cancer 26 % ,  Melanoma 46%  and  Pancreatic cancer 60% +++++++++++++++++++ Vitamin D goal  is between 70-100.  Please make sure that you are taking your Vitamin D as directed.  It is very important as a natural anti-inflammatory  helping hair, skin, and nails, as well as reducing stroke and heart  attack risk.  It helps your bones and helps with mood. It also decreases numerous  cancer risks so please take it as directed.  Low Vit D is associated with a 200-300% higher risk for CANCER  and 200-300% higher risk for HEART   ATTACK  &  STROKE.   .....................................Marland Kitchen It is also associated with higher death rate at younger ages,  autoimmune diseases like Rheumatoid arthritis, Lupus, Multiple Sclerosis.    Also many other serious conditions, like depression, Alzheimer's Dementia, infertility, muscle aches, fatigue, fibromyalgia - just to name a few. ++++++++++++++++++ Recommend the book "The END of DIETING" by Dr Excell Seltzer  & the book "The END of DIABETES " by Dr Excell Seltzer At [email or web address] - get book & Audio CD's    Being diabetic has a  300% increased risk for heart attack, stroke, cancer, and alzheimer- type vascular dementia. It is very important that you work harder with diet by avoiding all foods that are white. Avoid white rice (brown & wild rice is OK), white potatoes (sweetpotatoes in moderation is OK), White bread or wheat bread or anything made out of white flour like bagels, donuts, rolls, buns, biscuits, cakes, pastries, cookies, pizza crust, and pasta (made from white flour & egg whites) - vegetarian pasta or spinach or wheat pasta is OK. Multigrain breads like Arnold's or [employer], or multigrain sandwich thins or flatbreads.  Diet, exercise and weight loss can reverse and cure diabetes in the early stages.  Diet, exercise and weight loss is very important in the control and prevention of complications of diabetes which affects every system in your body, ie. Brain - dementia/stroke, eyes - glaucoma/blindness, heart - heart attack/heart failure, kidneys - dialysis, stomach - gastric paralysis, intestines - malabsorption, nerves - severe painful neuritis, circulation - gangrene & loss of a leg(s), and finally cancer and Alzheimers.    I recommend avoid fried & greasy foods,  sweets/candy, white rice (brown or wild rice or Quinoa is OK), white  potatoes (sweet potatoes are OK) - anything made from white flour - bagels, doughnuts, rolls, buns, biscuits,white and wheat breads, pizza crust and traditional pasta made of white flour & egg white(vegetarian pasta or spinach or wheat pasta is OK).  Multi-grain bread is OK - like multi-grain flat bread or sandwich thins. Avoid alcohol in excess. Exercise is also important.    Eat all the vegetables you want - avoid meat, especially red meat and dairy - especially cheese.  Cheese is the most concentrated form of trans-fats which is the worst thing to clog up our arteries. Veggie cheese is OK which can be found in the fresh produce section at [employer] or [employer] or [employer]  ++++++++++++++++++++++ DASH Eating Plan  DASH stands for "Dietary Approaches to Stop Hypertension."   The DASH eating plan is a healthy eating plan that has been shown to reduce high blood pressure (hypertension). Additional health benefits may include reducing the risk of type 2 diabetes mellitus, heart disease, and stroke. The DASH eating plan may also help with weight loss. WHAT DO I NEED TO KNOW ABOUT THE DASH EATING PLAN? For the DASH eating plan, you will follow these general guidelines: Choose foods with a percent daily value for sodium of less than 5% (as listed on the food label). Use salt-free seasonings or herbs instead of table salt or sea salt. Check with your health care provider or pharmacist before using salt substitutes. Eat lower-sodium products, often labeled as "lower sodium" or "no  salt added." Eat fresh foods. Eat more vegetables, fruits, and low-fat dairy products. Choose whole grains. Look for the word "whole" as the first word in the ingredient list. Choose fish  Limit sweets, desserts, sugars, and sugary drinks. Choose heart-healthy fats. Eat veggie cheese  Eat more home-cooked food and less restaurant, buffet, and fast food. Limit fried foods. Cook foods using methods other than  frying. Limit canned vegetables. If you do use them, rinse them well to decrease the sodium. When eating at a restaurant, ask that your food be prepared with less salt, or no salt if possible.                      WHAT FOODS CAN I EAT? Read Dr Fara Olden Fuhrman's books on The End of Dieting & The End of Diabetes  Grains Whole grain or whole wheat bread. Brown rice. Whole grain or whole wheat pasta. Quinoa, bulgur, and whole grain cereals. Low-sodium cereals. Corn or whole wheat flour tortillas. Whole grain cornbread. Whole grain crackers. Low-sodium crackers.  Vegetables Fresh or frozen vegetables (raw, steamed, roasted, or grilled). Low-sodium or reduced-sodium tomato and vegetable juices. Low-sodium or reduced-sodium tomato sauce and paste. Low-sodium or reduced-sodium canned vegetables.   Fruits All fresh, canned (in natural juice), or frozen fruits.  Protein Products  All fish and seafood.  Dried beans, peas, or lentils. Unsalted nuts and seeds. Unsalted canned beans.  Dairy Low-fat dairy products, such as skim or 1% milk, 2% or reduced-fat cheeses, low-fat ricotta or cottage cheese, or plain low-fat yogurt. Low-sodium or reduced-sodium cheeses.  Fats and Oils Tub margarines without trans fats. Light or reduced-fat mayonnaise and salad dressings (reduced sodium). Avocado. Safflower, olive, or canola oils. Natural peanut or almond butter.  Other Unsalted popcorn and pretzels. The items listed above may not be a complete list of recommended foods or beverages. Contact your dietitian for more options.  ++++++++++++++++++  WHAT FOODS ARE NOT RECOMMENDED? Grains/ White flour or wheat flour White bread. White pasta. White rice. Refined cornbread. Bagels and croissants. Crackers that contain trans fat.  Vegetables  Creamed or fried vegetables. Vegetables in a . Regular canned vegetables. Regular canned tomato sauce and paste. Regular tomato and vegetable juices.  Fruits Dried fruits.  Canned fruit in light or heavy syrup. Fruit juice.  Meat and Other Protein Products Meat in general - RED meat & White meat.  Fatty cuts of meat. Ribs, chicken wings, all processed meats as bacon, sausage, bologna, salami, fatback, hot dogs, bratwurst and packaged luncheon meats.  Dairy Whole or 2% milk, cream, half-and-half, and cream cheese. Whole-fat or sweetened yogurt. Full-fat cheeses or blue cheese. Non-dairy creamers and whipped toppings. Processed cheese, cheese spreads, or cheese curds.  Condiments Onion and garlic salt, seasoned salt, table salt, and sea salt. Canned and packaged gravies. Worcestershire sauce. Tartar sauce. Barbecue sauce. Teriyaki sauce. Soy sauce, including reduced sodium. Steak sauce. Fish sauce. Oyster sauce. Cocktail sauce. Horseradish. Ketchup and mustard. Meat flavorings and tenderizers. Bouillon cubes. Hot sauce. Tabasco sauce. Marinades. Taco seasonings. Relishes.  Fats and Oils Butter, stick margarine, lard, shortening and bacon fat. Coconut, palm kernel, or palm oils. Regular salad dressings.  Pickles and olives. Salted popcorn and pretzels.  The items listed above may not be a complete list of foods and beverages to avoid.

## 2023-04-02 ENCOUNTER — Ambulatory Visit (INDEPENDENT_AMBULATORY_CARE_PROVIDER_SITE_OTHER): Payer: BC Managed Care – PPO | Admitting: Internal Medicine

## 2023-04-02 ENCOUNTER — Encounter: Payer: Self-pay | Admitting: Internal Medicine

## 2023-04-02 VITALS — BP 136/70 | HR 74 | Temp 97.9°F | Resp 16 | Ht 64.0 in | Wt 193.0 lb

## 2023-04-02 DIAGNOSIS — Z13220 Encounter for screening for lipoid disorders: Secondary | ICD-10-CM | POA: Diagnosis not present

## 2023-04-02 DIAGNOSIS — Z0001 Encounter for general adult medical examination with abnormal findings: Secondary | ICD-10-CM

## 2023-04-02 DIAGNOSIS — E559 Vitamin D deficiency, unspecified: Secondary | ICD-10-CM

## 2023-04-02 DIAGNOSIS — Z1389 Encounter for screening for other disorder: Secondary | ICD-10-CM

## 2023-04-02 DIAGNOSIS — Z130 Encounter for screening for diseases of the blood and blood-forming organs and certain disorders involving the immune mechanism: Secondary | ICD-10-CM | POA: Diagnosis not present

## 2023-04-02 DIAGNOSIS — Z136 Encounter for screening for cardiovascular disorders: Secondary | ICD-10-CM

## 2023-04-02 DIAGNOSIS — R5383 Other fatigue: Secondary | ICD-10-CM

## 2023-04-02 DIAGNOSIS — Z111 Encounter for screening for respiratory tuberculosis: Secondary | ICD-10-CM

## 2023-04-02 DIAGNOSIS — R7309 Other abnormal glucose: Secondary | ICD-10-CM

## 2023-04-02 DIAGNOSIS — Z1211 Encounter for screening for malignant neoplasm of colon: Secondary | ICD-10-CM

## 2023-04-02 DIAGNOSIS — Z131 Encounter for screening for diabetes mellitus: Secondary | ICD-10-CM | POA: Diagnosis not present

## 2023-04-02 DIAGNOSIS — Z0000 Encounter for general adult medical examination without abnormal findings: Secondary | ICD-10-CM

## 2023-04-02 DIAGNOSIS — Z79899 Other long term (current) drug therapy: Secondary | ICD-10-CM | POA: Diagnosis not present

## 2023-04-02 DIAGNOSIS — E6609 Other obesity due to excess calories: Secondary | ICD-10-CM

## 2023-04-02 DIAGNOSIS — I700 Atherosclerosis of aorta: Secondary | ICD-10-CM

## 2023-04-02 DIAGNOSIS — E782 Mixed hyperlipidemia: Secondary | ICD-10-CM

## 2023-04-02 DIAGNOSIS — Z8249 Family history of ischemic heart disease and other diseases of the circulatory system: Secondary | ICD-10-CM

## 2023-04-02 DIAGNOSIS — I10 Essential (primary) hypertension: Secondary | ICD-10-CM

## 2023-04-03 LAB — COMPLETE METABOLIC PANEL WITH GFR
AG Ratio: 2.4 (calc) (ref 1.0–2.5)
ALT: 15 U/L (ref 6–29)
AST: 21 U/L (ref 10–35)
Albumin: 4.6 g/dL (ref 3.6–5.1)
Alkaline phosphatase (APISO): 87 U/L (ref 37–153)
BUN: 23 mg/dL (ref 7–25)
CO2: 28 mmol/L (ref 20–32)
Calcium: 9.9 mg/dL (ref 8.6–10.4)
Chloride: 104 mmol/L (ref 98–110)
Creat: 0.9 mg/dL (ref 0.50–1.05)
Globulin: 1.9 g/dL (ref 1.9–3.7)
Glucose, Bld: 88 mg/dL (ref 65–99)
Potassium: 4.1 mmol/L (ref 3.5–5.3)
Sodium: 140 mmol/L (ref 135–146)
Total Bilirubin: 0.4 mg/dL (ref 0.2–1.2)
Total Protein: 6.5 g/dL (ref 6.1–8.1)
eGFR: 72 mL/min/{1.73_m2} (ref >=60)

## 2023-04-03 LAB — LIPID PANEL
Cholesterol: 169 mg/dL (ref <200)
HDL: 49 mg/dL — ABNORMAL LOW (ref >=50)
LDL Cholesterol (Calc): 84 mg/dL
Non-HDL Cholesterol (Calc): 120 mg/dL (ref <130)
Total CHOL/HDL Ratio: 3.4 (calc) (ref <5.0)
Triglycerides: 299 mg/dL — ABNORMAL HIGH (ref <150)

## 2023-04-03 LAB — MICROALBUMIN / CREATININE URINE RATIO
Creatinine, Urine: 109 mg/dL (ref 20–275)
Microalb Creat Ratio: 5 mg/g{creat} (ref <30)
Microalb, Ur: 0.5 mg/dL

## 2023-04-03 LAB — INSULIN, RANDOM: Insulin: 30.5 u[IU]/mL — ABNORMAL HIGH

## 2023-04-03 LAB — CBC WITH DIFFERENTIAL/PLATELET
Absolute Monocytes: 540 {cells}/uL (ref 200–950)
Basophils Absolute: 57 {cells}/uL (ref 0–200)
Basophils Relative: 0.8 %
Eosinophils Absolute: 156 {cells}/uL (ref 15–500)
Eosinophils Relative: 2.2 %
HCT: 41.3 % (ref 35.0–45.0)
Hemoglobin: 13.9 g/dL (ref 11.7–15.5)
Lymphs Abs: 1747 {cells}/uL (ref 850–3900)
MCH: 31.2 pg (ref 27.0–33.0)
MCHC: 33.7 g/dL (ref 32.0–36.0)
MCV: 92.6 fL (ref 80.0–100.0)
MPV: 11.3 fL (ref 7.5–12.5)
Monocytes Relative: 7.6 %
Neutro Abs: 4601 {cells}/uL (ref 1500–7800)
Neutrophils Relative %: 64.8 %
Platelets: 239 10*3/uL (ref 140–400)
RBC: 4.46 10*6/uL (ref 3.80–5.10)
RDW: 12.2 % (ref 11.0–15.0)
Total Lymphocyte: 24.6 %
WBC: 7.1 10*3/uL (ref 3.8–10.8)

## 2023-04-03 LAB — URINALYSIS, ROUTINE W REFLEX MICROSCOPIC
Bilirubin Urine: NEGATIVE
Glucose, UA: NEGATIVE
Hgb urine dipstick: NEGATIVE
Ketones, ur: NEGATIVE
Leukocytes,Ua: NEGATIVE
Nitrite: NEGATIVE
Protein, ur: NEGATIVE
Specific Gravity, Urine: 1.022 (ref 1.001–1.035)
pH: 5.5 (ref 5.0–8.0)

## 2023-04-03 LAB — MAGNESIUM: Magnesium: 2.3 mg/dL (ref 1.5–2.5)

## 2023-04-03 LAB — HEMOGLOBIN A1C
Hgb A1c MFr Bld: 5.5 %{Hb} (ref <5.7)
Mean Plasma Glucose: 111 mg/dL
eAG (mmol/L): 6.2 mmol/L

## 2023-04-03 LAB — IRON, TOTAL/TOTAL IRON BINDING CAP
%SAT: 24 % (ref 16–45)
Iron: 84 ug/dL (ref 45–160)
TIBC: 352 ug/dL (ref 250–450)

## 2023-04-03 LAB — TSH: TSH: 3.1 m[IU]/L (ref 0.40–4.50)

## 2023-04-03 LAB — VITAMIN D 25 HYDROXY (VIT D DEFICIENCY, FRACTURES): Vit D, 25-Hydroxy: 68 ng/mL (ref 30–100)

## 2023-04-03 LAB — VITAMIN B12: Vitamin B-12: 713 pg/mL (ref 200–1100)

## 2023-04-03 NOTE — Progress Notes (Signed)
^<^<^<^<^<^<^<^<^<^<^<^<^<^<^<^<^<^<^<^<^<^<^<^<^<^<^<^<^<^<^<^<^<^<^<^<^ ^>^>^>^>^>^>^>^>^>^>^>>^>^>^>^>^>^>^>^>^>^>^>^>^>^>^>^>^>^>^>^>^>^>^>^>^>  -    Chol = 169  Excellent   - Very low risk for Heart Attack  / Stroke ^>^>^>^>^>^>^>^>^>^>^>^>^>^>^>^>^>^>^>^>^>^>^>^>^>^>^>^>^>^>^>^>^>^>^>^>^ ^>^>^>^>^>^>^>^>^>^>^>^>^>^>^>^>^>^>^>^>^>^>^>^>^>^>^>^>^>^>^>^>^>^>^>^>^  -      ^<^<^<^<^<^<^<^<^<^<^<^<^<^<^<^<^<^<^<^<^<^<^<^<^<^<^<^<^<^<^<^<^<^<^<^<^ ^>^>^>^>^>^>^>^>^>^>^>^>^>^>^>^>^>^>^>^>^>^>^>^>^>^>^>^>^>^>^>^>^>^>^>^>^  - But Triglycerides ( = 299 ) or fats in blood are too high                 (   Ideal or  Goal is less than 150  !  )    - Recommend avoid fried & greasy foods,  sweets / candy,   - Avoid white rice  (brown or wild rice or Quinoa is OK),   - Avoid white potatoes  (sweet potatoes are OK)   - Avoid anything made from white flour  - bagels, doughnuts, rolls, buns, biscuits, white and   wheat breads, pizza crust and traditional  pasta made of white flour & egg white  - (vegetarian pasta or spinach or wheat pasta is OK).    - Multi-grain bread is OK - like multi-grain flat bread or  sandwich thins.   - Avoid alcohol in excess.   - Exercise is also important  ^<^<^<^<^<^<^<^<^<^<^<^<^<^<^<^<^<^<^<^<^<^<^<^<^<^<^<^<^<^<^<^<^<^<^<^<^ ^>^>^>^>^>^>^>^>^>^>^>^>^>^>^>^>^>^>^>^>^>^>^>^>^>^>^>^>^>^>^>^>^>^>^>^>^  -  Iron & Vit B12 levels are Norma  &  OK   ^<^<^<^<^<^<^<^<^<^<^<^<^<^<^<^<^<^<^<^<^<^<^<^<^<^<^<^<^<^<^<^<^<^<^<^<^ ^>^>^>^>^>^>^>^>^>^>^>^>^>^>^>^>^>^>^>^>^>^>^>^>^>^>^>^>^>^>^>^>^>^>^>^>^  -  A1c - Normal - No Diabetes  - Great !   ^<^<^<^<^<^<^<^<^<^<^<^<^<^<^<^<^<^<^<^<^<^<^<^<^<^<^<^<^<^<^<^<^<^<^<^<^ ^>^>^>^>^>^>^>^>^>^>^>^>^>^>^>^>^>^>^>^>^>^>^>^>^>^>^>^>^>^>^>^>^>^>^>^>^  -  Vitamin D = 68 - Excellent - Please kkeep dose same   !  ^<^<^<^<^<^<^<^<^<^<^<^<^<^<^<^<^<^<^<^<^<^<^<^<^<^<^<^<^<^<^<^<^<^<^<^<^ ^>^>^>^>^>^>^>^>^>^>^>^>^>^>^>^>^>^>^>^>^>^>^>^>^>^>^>^>^>^>^>^>^>^>^>^>^  -  All Else - CBC - Kidneys - Electrolytes - Liver - Magnesium & Thyroid    - all  Normal / OK ^<^<^<^<^<^<^<^<^<^<^<^<^<^<^<^<^<^<^<^<^<^<^<^<^<^<^<^<^<^<^<^<^<^<^<^<^ ^>^>^>^>^>^>^>^>^>^>^>^>^>^>^>^>^>^>^>^>^>^>^>^>^>^>^>^>^>^>^>^>^>^>^>^>^  -  Keep up the [place] Work  !   ^<^<^<^<^<^<^<^<^<^<^<^<^<^<^<^<^<^<^<^<^<^<^<^<^<^<^<^<^<^<^<^<^<^<^<^<^ ^>^>^>^>^>^>^>^>^>^>^>^>^>^>^>^>^>^>^>^>^>^>^>^>^>^>^>^>^>^>^>^>^>^>^>^>^

## 2023-04-23 ENCOUNTER — Other Ambulatory Visit: Payer: Self-pay | Admitting: Allergy and Immunology

## 2023-05-14 ENCOUNTER — Other Ambulatory Visit: Payer: Self-pay | Admitting: Allergy and Immunology

## 2023-05-14 DIAGNOSIS — K219 Gastro-esophageal reflux disease without esophagitis: Secondary | ICD-10-CM

## 2023-06-27 ENCOUNTER — Encounter: Payer: Self-pay | Admitting: Allergy and Immunology

## 2023-06-27 ENCOUNTER — Ambulatory Visit: Payer: BC Managed Care – PPO | Admitting: Allergy and Immunology

## 2023-06-27 VITALS — BP 150/90 | HR 66 | Resp 12 | Ht 63.2 in | Wt 197.4 lb

## 2023-06-27 DIAGNOSIS — J4540 Moderate persistent asthma, uncomplicated: Secondary | ICD-10-CM | POA: Diagnosis not present

## 2023-06-27 DIAGNOSIS — J301 Allergic rhinitis due to pollen: Secondary | ICD-10-CM | POA: Diagnosis not present

## 2023-06-27 DIAGNOSIS — K219 Gastro-esophageal reflux disease without esophagitis: Secondary | ICD-10-CM

## 2023-06-27 MED ORDER — METHYLPREDNISOLONE ACETATE 80 MG/ML IJ SUSP
80.0000 mg | Freq: Once | INTRAMUSCULAR | Status: AC
Start: 2023-06-27 — End: 2023-06-27
  Administered 2023-06-27: 80 mg via INTRAMUSCULAR

## 2023-06-27 MED ORDER — AIRSUPRA 90-80 MCG/ACT IN AERO: 2.0000 | INHALATION_SPRAY | RESPIRATORY_TRACT | 1 refills | Status: AC | PRN

## 2023-06-27 MED ORDER — PANTOPRAZOLE SODIUM 40 MG PO TBEC: DELAYED_RELEASE_TABLET | ORAL | 1 refills | Status: DC

## 2023-06-27 NOTE — Patient Instructions (Addendum)
  1.  Allergen avoidance measures - pollens  2.  Treat and prevent inflammation:   A. Montelukast 10 mg - 1 tablet 1 time per day  B. Trelegy 200 - 1 inhalation 3-7 times per week   C. Nasacort - 1 spray each nostril 1-7 times per week  3. Treat and prevent LPR:   A. Pantoprazole 40 mg - 1 tablet 1-2 times per day  4. If needed:   A. Xyzal 5 mg - 1 tablet 1-2 times per day  B. AIRSUPRA - 2 inhalations every 6 hours (replaces albuterol)  5. For this recent episode:   A. Use Trelegy every day  B. Use pantoprazole 2 times per day  C. Depomedrol 80 mg IM delivered in clinic today  6. Flu = Tamiflu. Covid = Paxlovid  7. Return to clinic in 6 months or earlier if problem

## 2023-06-27 NOTE — Progress Notes (Unsigned)
[place] - [place] - [place] - [place] - [place]   Follow-up Note  Referring Provider: Lucky Cowboy, MD Primary Provider: Lucky Cowboy, MD Date of Office Visit: 06/27/2023  Subjective:   Rhonda [name] (DOB: [birth year]) is a 62 y.o. female who [facility] in re-evaluation of the following:  HPI: Rhonda [name] returns to this clinic in evaluation of asthma, allergic rhinitis, LPR.  I last saw her in this clinic 25 Dec 2022.  She has done very well since her last visit without any significant respiratory tract symptoms while consistently using a triple inhaler and montelukast and rarely some nasal steroid.  Her triple inhaler use is approximately 5 times per week.  She has not required a systemic steroid or an antibiotic for any type of airway issue.  However, about 3 weeks ago she has developed a cough.  She has also had an irritated throat and her throat has been raspy and she has had throat clearing and some phlegm.  She has not had any chest pain or significant amounts of shortness of breath or chest tightness or associated systemic or constitutional symptoms including fever.  She has not had any concurrent upper airway symptoms with this issue.  Her reflux is under very good control while using pantoprazole once a day.  She is somewhat intolerant of adding famotidine because it upsets her stomach.  She does not receive the flu vaccine.  Allergies as of 06/27/2023       Reactions   Ace Inhibitors    cough   Azithromycin    rash   Prednisone    High dose   Vicodin [hydrocodone-acetaminophen]    Nausea/vomiting        Medication List    albuterol 108 (90 Base) MCG/ACT inhaler Commonly known as: VENTOLIN HFA Can inhale two puffs every four to six hours as needed for cough or wheeze.   aspirin 81 MG tablet Take 81 mg by mouth daily.   bisoprolol-hydrochlorothiazide 5-6.25 MG tablet Commonly known as: ZIAC TAKE ONE  TABLET BY MOUTH EVERY DAY FOR BLOOD PRESSURE   CALCIUM-MAGNESIUM-VITAMIN D PO Take by mouth daily.   celecoxib 200 MG capsule Commonly known as: CELEBREX Take  1 capsule  Daily  with Food for Pain & Inflammation                                       /                                           TAKE                                              BY                                            MOUTH   famotidine 40 MG tablet Commonly known as: PEPCID TAKE ONE TABLET BY MOUTH AT BEDTIME   levocetirizine 5 MG tablet Commonly known as: XYZAL 1 tablet 1-2 times a  day as needed   montelukast 10 MG tablet Commonly known as: SINGULAIR TAKE ONE TABLET BY MOUTH AT BEDTIME   MULTIVITAMIN PO Take by mouth daily.   olmesartan 40 MG tablet Commonly known as: BENICAR TAKE ONE TABLET BY MOUTH AT BEDTIME FOR BLOOD PRESSURE   pantoprazole 40 MG tablet Commonly known as: PROTONIX TAKE ONE TABLET BY MOUTH EVERY DAY   pravastatin 40 MG tablet Commonly known as: PRAVACHOL TAKE ONE TABLET BY MOUTH AT BEDTIME FOR CHOLESTEROL   Trelegy Ellipta 200-62.5-25 MCG/ACT Aepb Generic drug: Fluticasone-Umeclidin-Vilant INHALE 1 PUFF BY MOUTH EVERY DAY. RINSE MOUTH AFTER USE    Past Medical History:  Diagnosis Date   Allergy    Asthma    Eczema    History of COVID-19 03/01/2020   [facility]-01/14/20    Hyperlipidemia    Hypertension    Migraine    NSAID induced gastritis 03/01/2020   Obesity    Prediabetes    Vitamin D deficiency     Past Surgical History:  Procedure Laterality Date   DILATION AND CURETTAGE, DIAGNOSTIC / THERAPEUTIC  2009   ENDOMETRIAL ABLATION     LYMPH NODE BIOPSY Left 1991   negative   NEVUS EXCISION  2012   dysplastic from back    Review of systems negative except as noted in HPI / PMHx or noted below:  Review of Systems  Constitutional: Negative.   HENT: Negative.    Eyes: Negative.   Respiratory: Negative.    Cardiovascular: Negative.    Gastrointestinal: Negative.   Genitourinary: Negative.   Musculoskeletal: Negative.   Skin: Negative.   Neurological: Negative.   Endo/Heme/Allergies: Negative.   Psychiatric/Behavioral: Negative.       Objective:   Vitals:   06/27/23 0922  BP: (!) 150/90  Pulse: 66  Resp: 12  SpO2: 93%   Height: 5' 3.2" (160.5 cm)  Weight: 197 lb 6.4 oz (89.5 kg)   Physical Exam Constitutional:      Appearance: She is not diaphoretic.     Comments: Throat clearing  HENT:     Head: Normocephalic.     Right Ear: Tympanic membrane, ear canal and external ear normal.     Left Ear: Tympanic membrane, ear canal and external ear normal.     Nose: Nose normal. No mucosal edema or rhinorrhea.     Mouth/Throat:     Pharynx: Uvula midline. No oropharyngeal exudate.  Eyes:     Conjunctiva/sclera: Conjunctivae normal.  Neck:     Thyroid: No thyromegaly.     Trachea: Trachea normal. No tracheal tenderness or tracheal deviation.  Cardiovascular:     Rate and Rhythm: Normal rate and regular rhythm.     Heart sounds: Normal heart sounds, S1 normal and S2 normal. No murmur heard. Pulmonary:     Effort: No respiratory distress.     Breath sounds: Normal breath sounds. No stridor. No wheezing or rales.  Lymphadenopathy:     Head:     Right side of head: No tonsillar adenopathy.     Left side of head: No tonsillar adenopathy.     Cervical: No cervical adenopathy.  Skin:    Findings: No erythema or rash.     Nails: There is no clubbing.  Neurological:     Mental Status: She is alert.     Diagnostics: Spirometry was performed and demonstrated an FEV1 of 2.56 at 110 % of predicted.  Assessment and Plan:   1. Not well controlled moderate persistent  asthma   2. Seasonal allergic rhinitis due to pollen   3. LPRD (laryngopharyngeal reflux disease)   4. Gastroesophageal reflux disease, unspecified whether esophagitis present    1.  Allergen avoidance measures - pollens  2.  Treat and prevent  inflammation:   A. Montelukast 10 mg - 1 tablet 1 time per day  B. Trelegy 200 - 1 inhalation 3-7 times per week   C. Nasacort - 1 spray each nostril 1-7 times per week  3. Treat and prevent LPR:   A. Pantoprazole 40 mg - 1 tablet 1-2 times per day  4. If needed:   A. Xyzal 5 mg - 1 tablet 1-2 times per day  B. AIRSUPRA - 2 inhalations every 6 hours (replaces albuterol)  5. For this recent episode:   A. Use Trelegy every day  B. Use pantoprazole 2 times per day  C. Depomedrol 80 mg IM delivered in clinic today  6. Flu = Tamiflu. Covid = Paxlovid  7. Return to clinic in 6 months or earlier if problem  Chandra will utilize a systemic steroid along with other anti-inflammatory agents for her airway and also increase her therapy for reflux as I suspect that she probably obtained a viral respiratory tract infection that started this event and then rolled over into an exacerbation of her LPR and her asthma.  Assuming she does well with the plan noted above I will see her back in this clinic in 6 months or earlier if there is a problem.  Laurette Schimke, MD Allergy / Immunology [facility]

## 2023-06-28 ENCOUNTER — Encounter: Payer: Self-pay | Admitting: Allergy and Immunology

## 2023-07-09 ENCOUNTER — Encounter: Payer: Self-pay | Admitting: Nurse Practitioner

## 2023-07-09 ENCOUNTER — Ambulatory Visit: Payer: BC Managed Care – PPO | Admitting: Nurse Practitioner

## 2023-07-09 VITALS — BP 140/90 | HR 63 | Temp 97.4°F | Ht 64.0 in | Wt 197.6 lb

## 2023-07-09 DIAGNOSIS — Z9109 Other allergy status, other than to drugs and biological substances: Secondary | ICD-10-CM

## 2023-07-09 DIAGNOSIS — R7309 Other abnormal glucose: Secondary | ICD-10-CM

## 2023-07-09 DIAGNOSIS — K219 Gastro-esophageal reflux disease without esophagitis: Secondary | ICD-10-CM

## 2023-07-09 DIAGNOSIS — J45909 Unspecified asthma, uncomplicated: Secondary | ICD-10-CM

## 2023-07-09 DIAGNOSIS — Z79899 Other long term (current) drug therapy: Secondary | ICD-10-CM

## 2023-07-09 DIAGNOSIS — I10 Essential (primary) hypertension: Secondary | ICD-10-CM

## 2023-07-09 DIAGNOSIS — E782 Mixed hyperlipidemia: Secondary | ICD-10-CM

## 2023-07-09 DIAGNOSIS — E559 Vitamin D deficiency, unspecified: Secondary | ICD-10-CM

## 2023-07-09 NOTE — Patient Instructions (Signed)

## 2023-07-09 NOTE — Progress Notes (Signed)
FOLLOW UP  Assessment and Plan:   Asthma Follows with Dr. Sharyn Lull Allergy Specialist Well controlled on current regimen Continue meds, avoid triggers  Hypertension Discussed DASH (Dietary Approaches to Stop Hypertension) DASH diet is lower in sodium than a typical American diet. Cut back on foods that are high in saturated fat, cholesterol, and trans fats. Eat more whole-grain foods, fish, poultry, and nuts Remain active and exercise as tolerated daily.  Monitor BP at home-Call if greater than 130/80.  Check CMP/CBC  Cholesterol Discussed lifestyle modifications. Recommended diet heavy in fruits and veggies, omega 3's. Decrease consumption of animal meats, cheeses, and dairy products. Remain active and exercise as tolerated. Continue to monitor. Check lipids/TSH  Other abnormal glucose Recent A1Cs at goal Education: Reviewed 'ABCs' of diabetes management  Discussed goals to be met and/or maintained include A1C (<7) Blood pressure (<130/80) Cholesterol (LDL <70) Continue Eye Exam yearly  Continue Dental Exam Q6 mo Discussed dietary recommendations Discussed Physical Activity recommendations Check A1C  Morbid obesity (HCC) - BMI 35 with htn, hld Discussed appropriate BMI Diet modification. Physical activity. Encouraged/praised to build confidence.  Vitamin D Def  Continue supplementation to maintain goal of 60-100 Monitor levels  GERD/ Hx of NSAID induced gastritis Continue medications. No suspected reflux complications (Barret/stricture). Lifestyle modification:  wt loss, avoid meals 2-3h before bedtime. Consider eliminating food triggers:  chocolate, caffeine, EtOH, acid/spicy food.  Allergies/cough Follows with allergist Continue antihistamine and Airsupra Avoid triggers   Medication Management All medications discussed and reviewed in full. All questions and concerns regarding medications addressed.    Orders Placed This Encounter  Procedures   CBC  with Differential/Platelet   COMPLETE METABOLIC PANEL WITH GFR   Lipid panel   Hemoglobin A1c   Notify office for further evaluation and treatment, questions or concerns if any reported s/s fail to improve.   The patient was advised to call back or seek an in-person evaluation if any symptoms worsen or if the condition fails to improve as anticipated.   Further disposition pending results of labs. Discussed med's effects and SE's.    I discussed the assessment and treatment plan with the patient. The patient was provided an opportunity to ask questions and all were answered. The patient agreed with the plan and demonstrated an understanding of the instructions.  Discussed med's effects and SE's. Screening labs and tests as requested with regular follow-up as recommended.  I provided 30 minutes of face-to-face time during this encounter including counseling, chart review, and critical decision making was preformed.   Future Appointments  Date Time Provider Department Center  10/08/2023  4:00 PM Lucky Cowboy, MD [facility] None  12/19/2023  8:30 AM Kozlow, Alvira Philips, MD [facility] None  01/14/2024  4:00 PM Adela Glimpse, NP [facility] None  04/10/2024  3:00 PM Lucky Cowboy, MD [facility] None    ----------------------------------------------------------------------------------------------------------------------  HPI 62 y.o. female  presents for 3 month follow up on hypertension, cholesterol, glucose management, obesity and vitamin D deficiency.   Overall she reports feeling well. She has no new concerns at th is time.  She is enjoying playing [employer] weekly.  She notes that she recently saw [place] [facility] for right eye retinal detachment.  Had this repaired and is now seeing well with some occasional floaters.  Improved and continues to follow up yearly.   She has asthma currently well controlled on Symbicort and Singulair. She uses albuterol rarely, recently  switched to AirSupra but has not yet had to use. Allergies, follows  recently with Dr. Ardyth Harps. Has had persistent dry cough, secretions in AM. Taking xyxal. Has nasocort but doesn't like to use. Was recently placed on Pantoprazole for cough but no longer wants to take d/t increase r/f malabsorption.   she has a diagnosis of NSAID gastritis (per 2017 EGD).   Screening for Colon Cancer Completed Colonoscopy 03/06/22; benign neoplasm of sigmoid colon. Recall 5 years.  Able to tolerate Celebrex.  She has bilateral thumb OA and is seeing Dr. Merlyn Lot, she is on celebrex PRN at this time. Her pain is somewhat improved and monitoring for now, wants to avoid surgery.  She will continue Famotidine.    BMI is Body mass index is 33.92 kg/m., she has been working on diet and exercise.  Wt Readings from Last 3 Encounters:  07/09/23 197 lb 9.6 oz (89.6 kg)  06/27/23 197 lb 6.4 oz (89.5 kg)  04/02/23 193 lb (87.5 kg)    She is on cholesterol medication (pravastatin 40 mg daily) and denies myalgias. Her cholesterol is at goal. The cholesterol last visit was:   Lab Results  Component Value Date   CHOL 169 04/02/2023   HDL 49 (L) 04/02/2023   LDLCALC 84 04/02/2023   TRIG 299 (H) 04/02/2023   CHOLHDL 3.4 04/02/2023    She has been working on diet and exercise for glucose management, and denies foot ulcerations, increased appetite, nausea, paresthesia of the feet, polydipsia, polyuria, visual disturbances, vomiting and weight loss. Last A1C in the office was:  Lab Results  Component Value Date   HGBA1C 5.5 04/02/2023   She had new mild hypothyroid at last visit, denies fatigue, dry skin, hair loss, constipation.  Lab Results  Component Value Date   TSH 3.10 04/02/2023   Last GFR:  Lab Results  Component Value Date   GFRNONAA 71 01/24/2021   Patient is on Vitamin D supplement and at goal at recent check:    Lab Results  Component Value Date   VD25OH 68 04/02/2023       Current Medications:   Current Outpatient Medications on File Prior to Visit  Medication Sig   AIRSUPRA 90-80 MCG/ACT AERO Inhale 2 puffs into the lungs as needed (every six hours for cough, wheeze, shortness of breath.  Rinse, gargle, and spit after use).   aspirin 81 MG tablet Take 81 mg by mouth daily.   bisoprolol-hydrochlorothiazide (ZIAC) 5-6.25 MG tablet TAKE ONE TABLET BY MOUTH EVERY DAY FOR BLOOD PRESSURE   CALCIUM-MAGNESIUM-VITAMIN D PO Take by mouth daily.   celecoxib (CELEBREX) 200 MG capsule Take  1 capsule  Daily  with Food for Pain & Inflammation                                       /                                           TAKE                                              BY  MOUTH   levocetirizine (XYZAL) 5 MG tablet 1 tablet 1-2 times a day as needed   montelukast (SINGULAIR) 10 MG tablet TAKE ONE TABLET BY MOUTH AT BEDTIME   Multiple Vitamin (MULTIVITAMIN PO) Take by mouth daily.   olmesartan (BENICAR) 40 MG tablet TAKE ONE TABLET BY MOUTH AT BEDTIME FOR BLOOD PRESSURE   pantoprazole (PROTONIX) 40 MG tablet Take one tablet one to two times daily as directed.   pravastatin (PRAVACHOL) 40 MG tablet TAKE ONE TABLET BY MOUTH AT BEDTIME FOR CHOLESTEROL   TRELEGY ELLIPTA 200-62.5-25 MCG/ACT AEPB INHALE 1 PUFF BY MOUTH EVERY DAY. RINSE MOUTH AFTER USE   albuterol (VENTOLIN HFA) 108 (90 Base) MCG/ACT inhaler Can inhale two puffs every four to six hours as needed for cough or wheeze. (Patient not taking: Reported on 07/09/2023)   No current facility-administered medications on file prior to visit.     Allergies:  Allergies  Allergen Reactions   Ace Inhibitors     cough   Azithromycin     rash   Prednisone     High dose   Vicodin [Hydrocodone-Acetaminophen]     Nausea/vomiting     Medical History:  Past Medical History:  Diagnosis Date   Allergy    Asthma    Eczema    History of COVID-19 03/01/2020   [facility]-01/14/20     Hyperlipidemia    Hypertension    Migraine    NSAID induced gastritis 03/01/2020   Obesity    Prediabetes    Vitamin D deficiency    Family history- Reviewed and unchanged Social history- Reviewed and unchanged   Review of Systems:  Review of Systems  Constitutional:  Negative for malaise/fatigue and weight loss.  HENT:  Negative for congestion, hearing loss, sinus pain and tinnitus.   Eyes:  Negative for blurred vision and double vision.  Respiratory:  Negative for cough (AM, productive, chronic with allergies, stable), shortness of breath and wheezing.   Cardiovascular:  Negative for chest pain, palpitations, orthopnea, claudication and leg swelling.  Gastrointestinal:  Negative for abdominal pain, blood in stool, constipation, diarrhea, heartburn, melena, nausea and vomiting.  Genitourinary: Negative.   Musculoskeletal:  Positive for joint pain (bil thumbs). Negative for falls, myalgias and neck pain.  Skin:  Negative for rash.  Neurological:  Negative for dizziness, tingling, sensory change, weakness and headaches.  Endo/Heme/Allergies:  Positive for environmental allergies. Negative for polydipsia.  Psychiatric/Behavioral: Negative.    All other systems reviewed and are negative.   Physical Exam: BP (!) 140/90   Pulse 63   Temp (!) 97.4 F (36.3 C)   Ht 5\' 4"  (1.626 m)   Wt 197 lb 9.6 oz (89.6 kg)   SpO2 98%   BMI 33.92 kg/m  Wt Readings from Last 3 Encounters:  07/09/23 197 lb 9.6 oz (89.6 kg)  06/27/23 197 lb 6.4 oz (89.5 kg)  04/02/23 193 lb (87.5 kg)   General Appearance: Well nourished, in no apparent distress. Eyes: PERRLA, EOMs, conjunctiva no swelling or erythema Sinuses: No Frontal/maxillary tenderness ENT/Mouth: Ext aud canals clear, TMs without erythema, bulging. No erythema, swelling, or exudate on post pharynx.  Tonsils not swollen or erythematous. Hearing normal. Mildly hoarse vocal quality.  Neck: Supple, thyroid normal.  Respiratory: Respiratory  effort normal, BS equal bilaterally without rales, rhonchi, wheezing or stridor.  Cardio: RRR with no MRGs. Brisk peripheral pulses without edema.  Abdomen: Soft, + BS.  Non-tender, no guarding, rebound, hernias, masses. Lymphatics: Non tender without lymphadenopathy.  Musculoskeletal: Full ROM without effusion, she does have DIP joint bony enlargement and mild deviations. Normal gait.   Skin: Warm, dry without rashes, lesions, ecchymosis.  Neuro: Cranial nerves intact. No cerebellar symptoms.  Psych: Awake and oriented X 3, normal affect, Insight and Judgment appropriate.    Adela Glimpse, NP 4:26 PM [facility]

## 2023-07-10 LAB — COMPLETE METABOLIC PANEL WITH GFR
AG Ratio: 2.5 (calc) (ref 1.0–2.5)
ALT: 16 U/L (ref 6–29)
AST: 20 U/L (ref 10–35)
Albumin: 4.7 g/dL (ref 3.6–5.1)
Alkaline phosphatase (APISO): 79 U/L (ref 37–153)
BUN/Creatinine Ratio: 32 (calc) — ABNORMAL HIGH (ref 6–22)
BUN: 29 mg/dL — ABNORMAL HIGH (ref 7–25)
CO2: 29 mmol/L (ref 20–32)
Calcium: 10 mg/dL (ref 8.6–10.4)
Chloride: 103 mmol/L (ref 98–110)
Creat: 0.92 mg/dL (ref 0.50–1.05)
Globulin: 1.9 g/dL (ref 1.9–3.7)
Glucose, Bld: 95 mg/dL (ref 65–99)
Potassium: 4.6 mmol/L (ref 3.5–5.3)
Sodium: 140 mmol/L (ref 135–146)
Total Bilirubin: 0.5 mg/dL (ref 0.2–1.2)
Total Protein: 6.6 g/dL (ref 6.1–8.1)
eGFR: 70 mL/min/{1.73_m2} (ref >=60)

## 2023-07-10 LAB — LIPID PANEL
Cholesterol: 157 mg/dL (ref <200)
HDL: 54 mg/dL (ref >=50)
LDL Cholesterol (Calc): 78 mg/dL
Non-HDL Cholesterol (Calc): 103 mg/dL (ref <130)
Total CHOL/HDL Ratio: 2.9 (calc) (ref <5.0)
Triglycerides: 148 mg/dL (ref <150)

## 2023-07-10 LAB — HEMOGLOBIN A1C
Hgb A1c MFr Bld: 5.4 %{Hb} (ref <5.7)
Mean Plasma Glucose: 108 mg/dL
eAG (mmol/L): 6 mmol/L

## 2023-07-10 LAB — CBC WITH DIFFERENTIAL/PLATELET
Absolute Lymphocytes: 2056 {cells}/uL (ref 850–3900)
Absolute Monocytes: 544 {cells}/uL (ref 200–950)
Basophils Absolute: 80 {cells}/uL (ref 0–200)
Basophils Relative: 1 %
Eosinophils Absolute: 136 {cells}/uL (ref 15–500)
Eosinophils Relative: 1.7 %
HCT: 41.4 % (ref 35.0–45.0)
Hemoglobin: 13.7 g/dL (ref 11.7–15.5)
MCH: 30.4 pg (ref 27.0–33.0)
MCHC: 33.1 g/dL (ref 32.0–36.0)
MCV: 92 fL (ref 80.0–100.0)
MPV: 11.2 fL (ref 7.5–12.5)
Monocytes Relative: 6.8 %
Neutro Abs: 5184 {cells}/uL (ref 1500–7800)
Neutrophils Relative %: 64.8 %
Platelets: 222 10*3/uL (ref 140–400)
RBC: 4.5 10*6/uL (ref 3.80–5.10)
RDW: 12.4 % (ref 11.0–15.0)
Total Lymphocyte: 25.7 %
WBC: 8 10*3/uL (ref 3.8–10.8)

## 2023-07-13 ENCOUNTER — Other Ambulatory Visit: Payer: Self-pay | Admitting: Internal Medicine

## 2023-07-26 ENCOUNTER — Other Ambulatory Visit: Payer: Self-pay | Admitting: Allergy and Immunology

## 2023-08-23 IMAGING — MG MM DIGITAL SCREENING BILAT W/ TOMO AND CAD
8 series · 8 of 24 positions shown · non-contrast
Comparison: Prior films

CLINICAL DATA: Screening.

EXAM:
DIGITAL SCREENING BILATERAL MAMMOGRAM WITH TOMOSYNTHESIS AND CAD
TECHNIQUE: Bilateral screening digital craniocaudal and mediolateral oblique
mammograms were obtained. Bilateral screening digital breast
tomosynthesis was performed. The images were evaluated with
computer-aided detection.

[R MLO synth-2D]
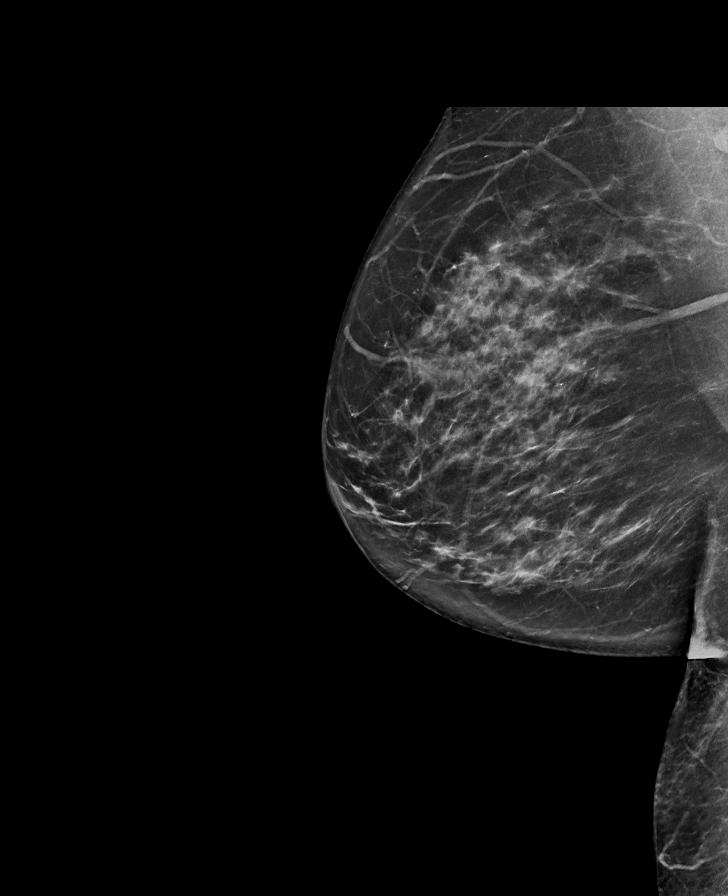

[L CC synth-2D]
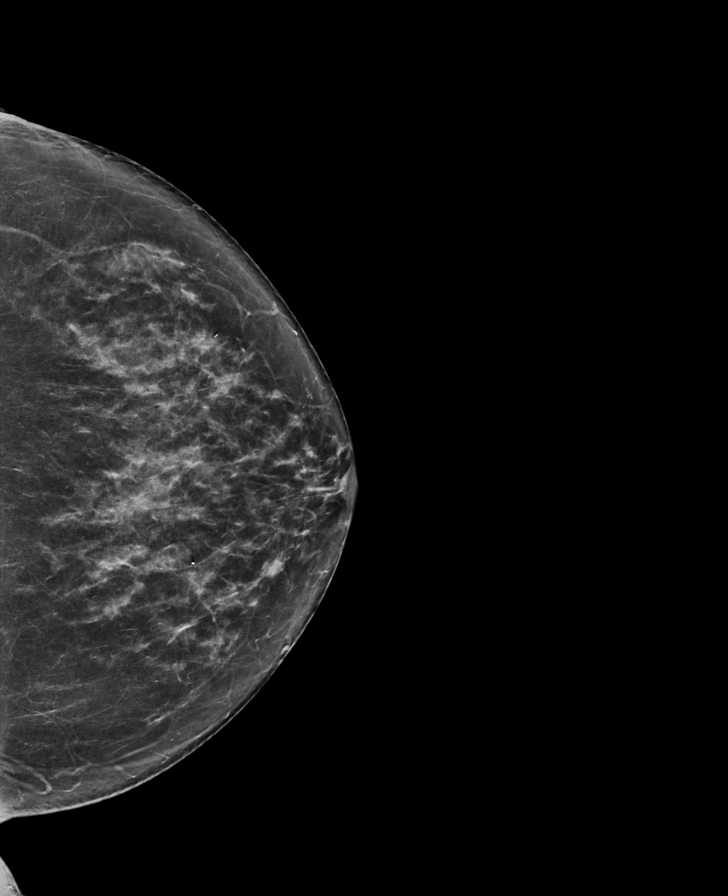

[R CC synth-2D]
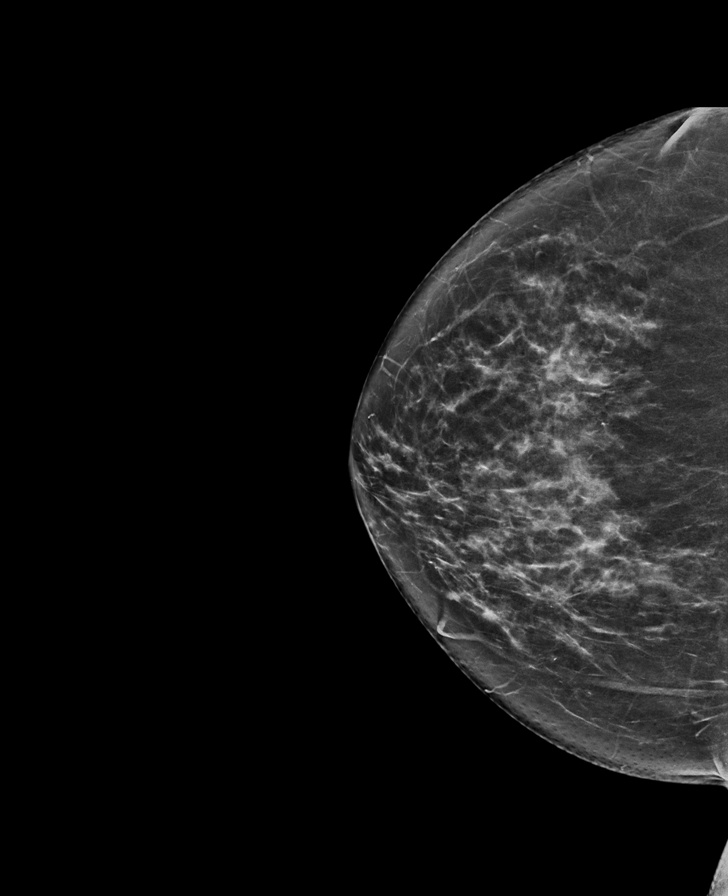

[L MLO synth-2D]
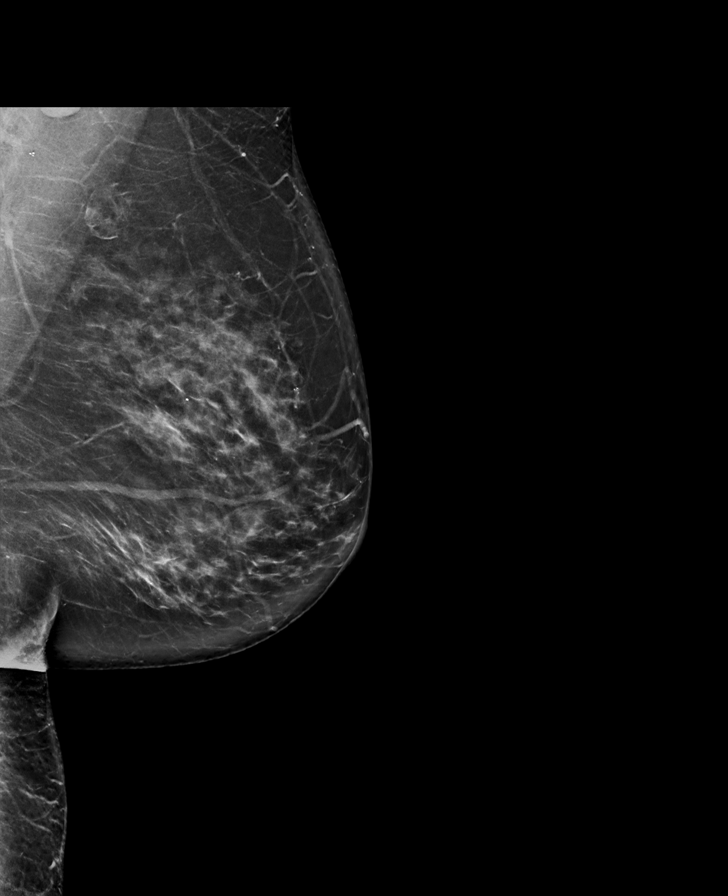

[L MLO tomo · tomo slice 43/85.0]
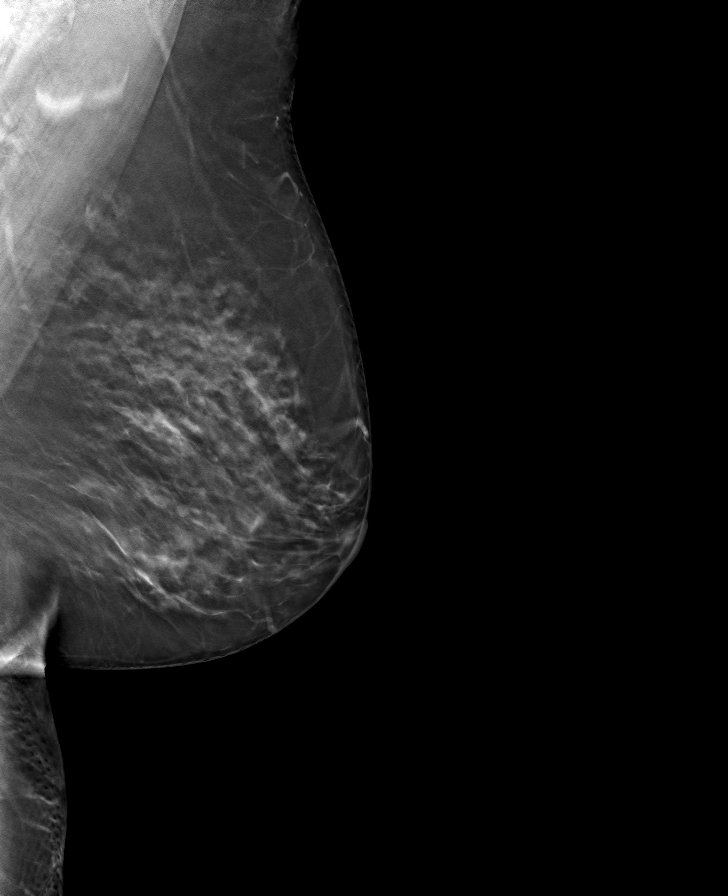

[R MLO tomo · tomo slice 39/78.0]
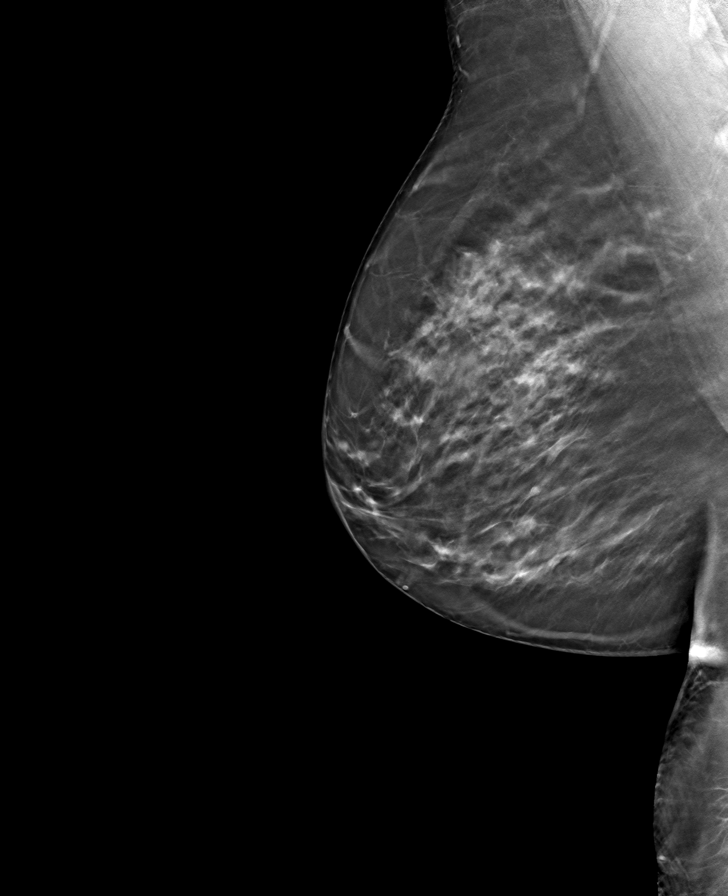

[L CC tomo · tomo slice 37/72.0]
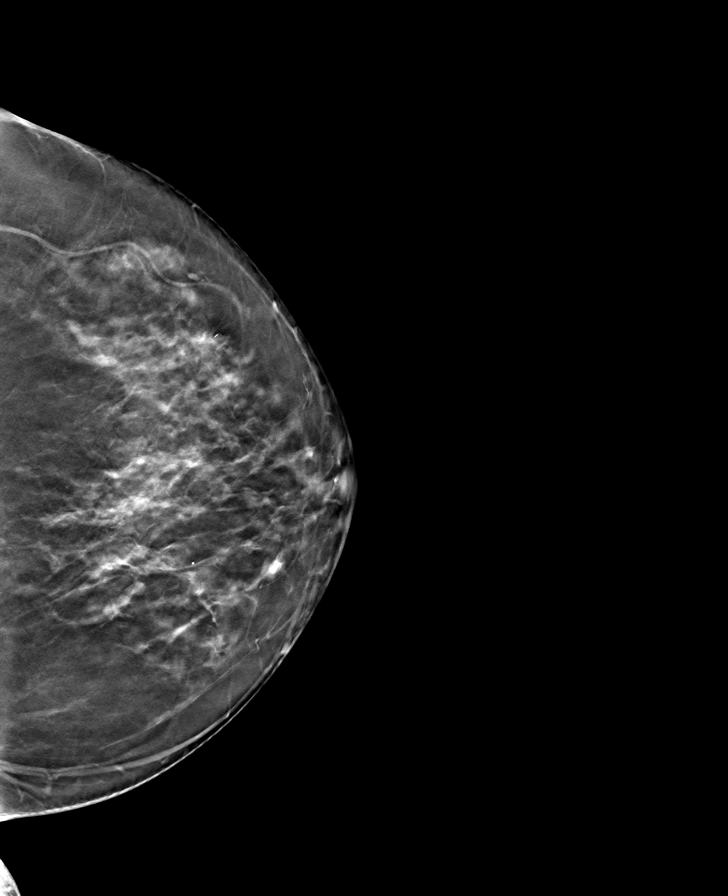

[R CC tomo · tomo slice 37/73.0]
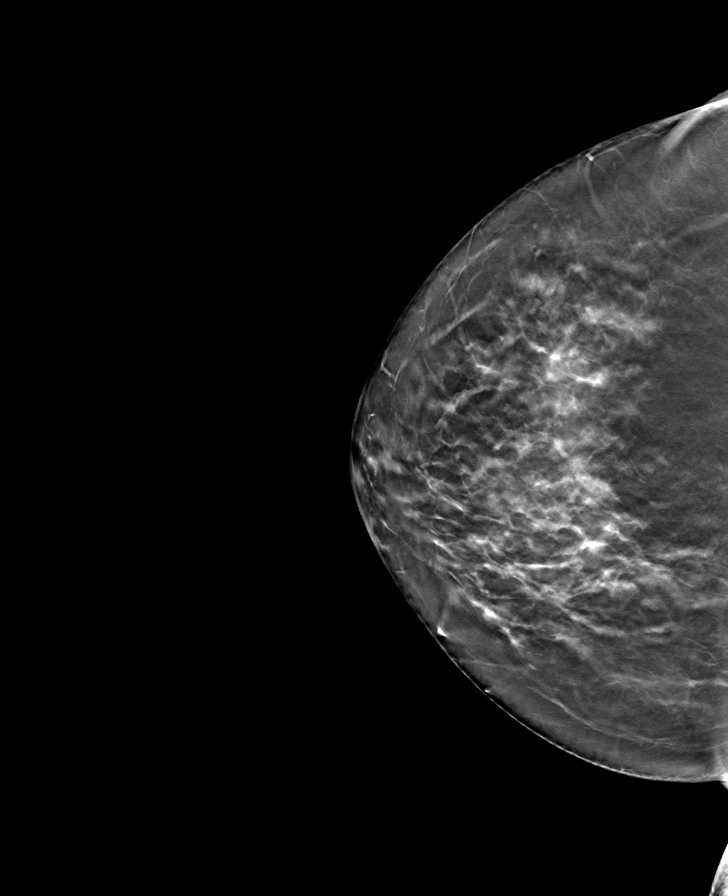

[8 of 24 positions shown; findings below may reference images not displayed]

ACR Breast Density Category c: The breast tissue is heterogeneously
dense, which may obscure small masses.
FINDINGS: In the right breast, a possible asymmetry warrants further
evaluation. In the left breast, no findings suspicious for
malignancy.
IMPRESSION: Further evaluation is suggested for possible asymmetry in the right
breast.

RECOMMENDATION:
Diagnostic mammogram and possibly ultrasound of the right breast.
(Code:43-Z-HHL)

The patient will be contacted regarding the findings, and additional
imaging will be scheduled.

BI-RADS CATEGORY  0: Incomplete. Need additional imaging evaluation
and/or prior mammograms for comparison.

## 2023-08-27 ENCOUNTER — Other Ambulatory Visit: Payer: Self-pay | Admitting: Nurse Practitioner

## 2023-08-27 DIAGNOSIS — I10 Essential (primary) hypertension: Secondary | ICD-10-CM

## 2023-09-18 ENCOUNTER — Encounter: Payer: Self-pay | Admitting: *Deleted

## 2023-09-20 ENCOUNTER — Other Ambulatory Visit: Payer: Self-pay | Admitting: Internal Medicine

## 2023-09-20 DIAGNOSIS — Z1231 Encounter for screening mammogram for malignant neoplasm of breast: Secondary | ICD-10-CM

## 2023-10-02 ENCOUNTER — Other Ambulatory Visit: Payer: Self-pay | Admitting: Obstetrics and Gynecology

## 2023-10-02 DIAGNOSIS — Z1231 Encounter for screening mammogram for malignant neoplasm of breast: Secondary | ICD-10-CM

## 2023-10-08 ENCOUNTER — Ambulatory Visit: Payer: BC Managed Care – PPO | Admitting: Internal Medicine

## 2023-10-11 ENCOUNTER — Encounter: Payer: Self-pay | Admitting: *Deleted

## 2023-10-16 ENCOUNTER — Ambulatory Visit
Admission: RE | Admit: 2023-10-16 | Discharge: 2023-10-16 | Disposition: A | Discharge status: Home / Self Care | Payer: BC Managed Care – PPO | Source: Ambulatory Visit | Attending: Internal Medicine | Admitting: Internal Medicine

## 2023-10-16 DIAGNOSIS — Z1231 Encounter for screening mammogram for malignant neoplasm of breast: Secondary | ICD-10-CM | POA: Diagnosis not present

## 2023-10-22 ENCOUNTER — Other Ambulatory Visit: Payer: Self-pay | Admitting: Obstetrics and Gynecology

## 2023-10-22 DIAGNOSIS — R928 Other abnormal and inconclusive findings on diagnostic imaging of breast: Secondary | ICD-10-CM

## 2023-10-25 DIAGNOSIS — H524 Presbyopia: Secondary | ICD-10-CM | POA: Diagnosis not present

## 2023-10-29 DIAGNOSIS — M1711 Unilateral primary osteoarthritis, right knee: Secondary | ICD-10-CM | POA: Diagnosis not present

## 2023-10-29 DIAGNOSIS — M1712 Unilateral primary osteoarthritis, left knee: Secondary | ICD-10-CM | POA: Diagnosis not present

## 2023-11-03 ENCOUNTER — Ambulatory Visit
Admission: RE | Admit: 2023-11-03 | Discharge: 2023-11-03 | Disposition: A | Discharge status: Home / Self Care | Source: Ambulatory Visit | Attending: Obstetrics and Gynecology | Admitting: Obstetrics and Gynecology

## 2023-11-03 DIAGNOSIS — N6311 Unspecified lump in the right breast, upper outer quadrant: Secondary | ICD-10-CM | POA: Diagnosis not present

## 2023-11-03 DIAGNOSIS — N6001 Solitary cyst of right breast: Secondary | ICD-10-CM | POA: Diagnosis not present

## 2023-11-03 DIAGNOSIS — R928 Other abnormal and inconclusive findings on diagnostic imaging of breast: Secondary | ICD-10-CM

## 2023-11-07 DIAGNOSIS — I10 Essential (primary) hypertension: Secondary | ICD-10-CM | POA: Diagnosis not present

## 2023-11-07 DIAGNOSIS — M1990 Unspecified osteoarthritis, unspecified site: Secondary | ICD-10-CM | POA: Diagnosis not present

## 2023-11-07 DIAGNOSIS — K219 Gastro-esophageal reflux disease without esophagitis: Secondary | ICD-10-CM | POA: Diagnosis not present

## 2023-11-07 DIAGNOSIS — E785 Hyperlipidemia, unspecified: Secondary | ICD-10-CM | POA: Diagnosis not present

## 2023-11-15 ENCOUNTER — Other Ambulatory Visit: Payer: Self-pay | Admitting: Allergy and Immunology

## 2023-11-21 DIAGNOSIS — M170 Bilateral primary osteoarthritis of knee: Secondary | ICD-10-CM | POA: Diagnosis not present

## 2023-11-27 DIAGNOSIS — D485 Neoplasm of uncertain behavior of skin: Secondary | ICD-10-CM | POA: Diagnosis not present

## 2023-11-27 DIAGNOSIS — L300 Nummular dermatitis: Secondary | ICD-10-CM | POA: Diagnosis not present

## 2023-11-27 DIAGNOSIS — L821 Other seborrheic keratosis: Secondary | ICD-10-CM | POA: Diagnosis not present

## 2023-12-10 DIAGNOSIS — Z13220 Encounter for screening for lipoid disorders: Secondary | ICD-10-CM | POA: Diagnosis not present

## 2023-12-10 DIAGNOSIS — Z131 Encounter for screening for diabetes mellitus: Secondary | ICD-10-CM | POA: Diagnosis not present

## 2023-12-10 DIAGNOSIS — Z0000 Encounter for general adult medical examination without abnormal findings: Secondary | ICD-10-CM | POA: Diagnosis not present

## 2023-12-10 DIAGNOSIS — M1990 Unspecified osteoarthritis, unspecified site: Secondary | ICD-10-CM | POA: Diagnosis not present

## 2023-12-10 DIAGNOSIS — E785 Hyperlipidemia, unspecified: Secondary | ICD-10-CM | POA: Diagnosis not present

## 2023-12-19 ENCOUNTER — Encounter: Payer: Self-pay | Admitting: Allergy and Immunology

## 2023-12-19 ENCOUNTER — Ambulatory Visit: Payer: BC Managed Care – PPO | Admitting: Allergy and Immunology

## 2023-12-19 VITALS — BP 138/82 | HR 74 | Resp 16

## 2023-12-19 DIAGNOSIS — J4540 Moderate persistent asthma, uncomplicated: Secondary | ICD-10-CM

## 2023-12-19 DIAGNOSIS — J301 Allergic rhinitis due to pollen: Secondary | ICD-10-CM

## 2023-12-19 DIAGNOSIS — K219 Gastro-esophageal reflux disease without esophagitis: Secondary | ICD-10-CM

## 2023-12-19 NOTE — Progress Notes (Unsigned)
 [place] - [place] - [place] - [place] - [place]   Follow-up Note  Referring Provider: Vangie Genet, MD Primary Provider: Vangie Genet, MD Date of Office Visit: 12/19/2023  Subjective:   Rhonda [name] (DOB: October 22, 1960) is a 63 y.o. female who [facility] in re-evaluation of the following:  HPI: Rhonda [name] presents to this clinic in evaluation of asthma, allergic rhinitis, LPR.  I last saw her in this clinic 27 June 2023.  She continues to use trilogy Monday through Friday and she believes that her asthma is been under very good control and she has not required a systemic steroid to treat an exacerbation and she rarely uses a Airsupra .  But she has had some problems the spring with her upper airways with some congestion and stuffiness and some slight cough that goes along with that.  She is not using any nasal steroid.  She does continue to use montelukast .  Her reflux is under very good control on her current plan.  Allergies as of 12/19/2023       Reactions   Ace Inhibitors    cough   Azithromycin    rash   Prednisone     High dose   Vicodin [hydrocodone-acetaminophen]    Nausea/vomiting        Medication List    Airsupra  90-80 MCG/ACT Aero Generic drug: Albuterol -Budesonide Inhale 2 puffs into the lungs as needed (every six hours for cough, wheeze, shortness of breath.  Rinse, gargle, and spit after use).   albuterol  108 (90 Base) MCG/ACT inhaler Commonly known as: VENTOLIN  HFA Can inhale two puffs every four to six hours as needed for cough or wheeze.   aspirin 81 MG tablet Take 81 mg by mouth daily.   bisoprolol -hydrochlorothiazide  5-6.25 MG tablet Commonly known as: ZIAC  TAKE ONE TABLET BY MOUTH EVERY DAY FOR BLOOD PRESSURE   CALCIUM-MAGNESIUM-VITAMIN D  PO Take by mouth daily.   celecoxib  200 MG capsule Commonly known as: CELEBREX  TAKE ONE CAPSULE BY MOUTH EVERY DAY   levocetirizine 5 MG  tablet Commonly known as: XYZAL  1 tablet 1-2 times a day as needed   montelukast  10 MG tablet Commonly known as: SINGULAIR  TAKE ONE TABLET BY MOUTH AT BEDTIME   MULTIVITAMIN PO Take by mouth daily.   olmesartan  40 MG tablet Commonly known as: BENICAR  TAKE ONE TABLET BY MOUTH AT BEDTIME FOR BLOOD PRESSURE   pantoprazole  40 MG tablet Commonly known as: PROTONIX  Take one tablet one to two times daily as directed.   pravastatin  40 MG tablet Commonly known as: PRAVACHOL  TAKE ONE TABLET BY MOUTH AT BEDTIME FOR CHOLESTEROL   Trelegy Ellipta  200-62.5-25 MCG/ACT Aepb Generic drug: Fluticasone-Umeclidin-Vilant INHALE 1 PUFF BY MOUTH EVERY DAY. RINSE MOUTH AFTER USE    Past Medical History:  Diagnosis Date   Allergy    Asthma    Eczema    History of COVID-19 03/01/2020   [facility]-01/14/20    Hyperlipidemia    Hypertension    Migraine    NSAID induced gastritis 03/01/2020   Obesity    Prediabetes    Vitamin D  deficiency     Past Surgical History:  Procedure Laterality Date   DILATION AND CURETTAGE, DIAGNOSTIC / THERAPEUTIC  2009   ENDOMETRIAL ABLATION     LYMPH NODE BIOPSY Left 1991   negative   NEVUS EXCISION  2012   dysplastic from back    Review of systems negative except as noted in HPI /  PMHx or noted below:  Review of Systems  Constitutional: Negative.   HENT: Negative.    Eyes: Negative.   Respiratory: Negative.    Cardiovascular: Negative.   Gastrointestinal: Negative.   Genitourinary: Negative.   Musculoskeletal: Negative.   Skin: Negative.   Neurological: Negative.   Endo/Heme/Allergies: Negative.   Psychiatric/Behavioral: Negative.       Objective:   Vitals:   12/19/23 0832  BP: 138/82  Pulse: 74  Resp: 16  SpO2: 96%          Physical Exam Constitutional:      Appearance: She is not diaphoretic.  HENT:     Head: Normocephalic.     Right Ear: Tympanic membrane, ear canal and external ear normal.     Left Ear:  Tympanic membrane, ear canal and external ear normal.     Nose: Nose normal. No mucosal edema or rhinorrhea.     Mouth/Throat:     Pharynx: Uvula midline. No oropharyngeal exudate.  Eyes:     Conjunctiva/sclera: Conjunctivae normal.  Neck:     Thyroid : No thyromegaly.     Trachea: Trachea normal. No tracheal tenderness or tracheal deviation.  Cardiovascular:     Rate and Rhythm: Normal rate and regular rhythm.     Heart sounds: Normal heart sounds, S1 normal and S2 normal. No murmur heard. Pulmonary:     Effort: No respiratory distress.     Breath sounds: Normal breath sounds. No stridor. No wheezing or rales.  Lymphadenopathy:     Head:     Right side of head: No tonsillar adenopathy.     Left side of head: No tonsillar adenopathy.     Cervical: No cervical adenopathy.  Skin:    Findings: No erythema or rash.     Nails: There is no clubbing.  Neurological:     Mental Status: She is alert.     Diagnostics: Spirometry was performed and demonstrated an FEV1 of 2.61 at 113 % of predicted.  Assessment and Plan:   1. Not well controlled moderate persistent asthma   2. Seasonal allergic rhinitis due to pollen   3. LPRD (laryngopharyngeal reflux disease)    1.  Allergen avoidance measures - pollens  2.  Treat and prevent inflammation:   A. Montelukast  10 mg - 1 tablet 1 time per day  B. Trelegy 200 - 1 inhalation 3-7 times per week   C. Nasacort  - 1 spray each nostril 1-7 times per week  3. Treat and prevent LPR:   A. Pantoprazole  40 mg - 1 tablet 1-2 times per day  4. If needed:   A. Xyzal  5 mg - 1 tablet 1-2 times per day  B. AIRSUPRA  - 2 inhalations every 6 hours  5. For this recent episode:   A. Prednisone  10 mg - 1 tablet 1 time per day for 7 days only  B. Use the Nasacort   6. Influenza = Tamiflu. Covid = Paxlovid  7. Return to clinic in 6 months or earlier if problem  [name] appears to be doing pretty well at this point in time other than the fact that she  has some pollen induced inflammation mostly of her upper airway and I have given her a low-dose of prednisone  for a few days and I encouraged her to use the Nasacort  on a pretty consistent basis.  Her asthma is under very good control and her lung function looks wonderful while she continues on her triple inhaler and her reflux appears to be  under very good control with her proton pump inhibitor.  Assuming she does well with this plan I will see her back in this clinic in 6 months or earlier if there is a problem.  Schuyler Custard, MD Allergy / Immunology [facility]

## 2023-12-19 NOTE — Patient Instructions (Signed)
  1.  Allergen avoidance measures - pollens  2.  Treat and prevent inflammation:   A. Montelukast  10 mg - 1 tablet 1 time per day  B. Trelegy 200 - 1 inhalation 3-7 times per week   C. Nasacort  - 1 spray each nostril 1-7 times per week  3. Treat and prevent LPR:   A. Pantoprazole  40 mg - 1 tablet 1-2 times per day  4. If needed:   A. Xyzal  5 mg - 1 tablet 1-2 times per day  B. AIRSUPRA  - 2 inhalations every 6 hours  5. For this recent episode:   A. Prednisone  10 mg - 1 tablet 1 time per day for 7 days only  B. Use the Nasacort   6. Influenza = Tamiflu. Covid = Paxlovid  7. Return to clinic in 6 months or earlier if problem

## 2023-12-20 ENCOUNTER — Encounter: Payer: Self-pay | Admitting: Allergy and Immunology

## 2023-12-24 ENCOUNTER — Other Ambulatory Visit: Payer: Self-pay | Admitting: Allergy and Immunology

## 2023-12-25 ENCOUNTER — Other Ambulatory Visit: Payer: Self-pay | Admitting: *Deleted

## 2023-12-25 MED ORDER — TRELEGY ELLIPTA 200-62.5-25 MCG/ACT IN AEPB: INHALATION_SPRAY | RESPIRATORY_TRACT | 5 refills | Status: DC

## 2024-01-01 DIAGNOSIS — Z1151 Encounter for screening for human papillomavirus (HPV): Secondary | ICD-10-CM | POA: Diagnosis not present

## 2024-01-01 DIAGNOSIS — Z01419 Encounter for gynecological examination (general) (routine) without abnormal findings: Secondary | ICD-10-CM | POA: Diagnosis not present

## 2024-01-01 DIAGNOSIS — Z6836 Body mass index (BMI) 36.0-36.9, adult: Secondary | ICD-10-CM | POA: Diagnosis not present

## 2024-01-01 DIAGNOSIS — Z124 Encounter for screening for malignant neoplasm of cervix: Secondary | ICD-10-CM | POA: Diagnosis not present

## 2024-01-14 ENCOUNTER — Ambulatory Visit: Payer: BC Managed Care – PPO | Admitting: Nurse Practitioner

## 2024-03-03 DIAGNOSIS — M1711 Unilateral primary osteoarthritis, right knee: Secondary | ICD-10-CM | POA: Diagnosis not present

## 2024-03-26 ENCOUNTER — Encounter (HOSPITAL_BASED_OUTPATIENT_CLINIC_OR_DEPARTMENT_OTHER): Payer: Self-pay

## 2024-03-26 ENCOUNTER — Ambulatory Visit (HOSPITAL_BASED_OUTPATIENT_CLINIC_OR_DEPARTMENT_OTHER): Admission: EM | Admit: 2024-03-26 | Discharge: 2024-03-26 | Disposition: A | Discharge status: Home / Self Care

## 2024-03-26 DIAGNOSIS — Z7982 Long term (current) use of aspirin: Secondary | ICD-10-CM | POA: Insufficient documentation

## 2024-03-26 DIAGNOSIS — N390 Urinary tract infection, site not specified: Secondary | ICD-10-CM | POA: Insufficient documentation

## 2024-03-26 DIAGNOSIS — R350 Frequency of micturition: Secondary | ICD-10-CM | POA: Diagnosis not present

## 2024-03-26 DIAGNOSIS — Z8616 Personal history of COVID-19: Secondary | ICD-10-CM | POA: Diagnosis not present

## 2024-03-26 DIAGNOSIS — R300 Dysuria: Secondary | ICD-10-CM | POA: Diagnosis not present

## 2024-03-26 DIAGNOSIS — Z79899 Other long term (current) drug therapy: Secondary | ICD-10-CM | POA: Insufficient documentation

## 2024-03-26 DIAGNOSIS — R339 Retention of urine, unspecified: Secondary | ICD-10-CM | POA: Diagnosis not present

## 2024-03-26 DIAGNOSIS — Z791 Long term (current) use of non-steroidal anti-inflammatories (NSAID): Secondary | ICD-10-CM | POA: Diagnosis not present

## 2024-03-26 LAB — POCT URINE DIPSTICK
Bilirubin, UA: NEGATIVE
Glucose, UA: NEGATIVE mg/dL
Ketones, POC UA: NEGATIVE mg/dL
Nitrite, UA: NEGATIVE
Spec Grav, UA: 1.01 (ref 1.010–1.025)
Urobilinogen, UA: 0.2 U/dL
pH, UA: 5.5 (ref 5.0–8.0)

## 2024-03-26 MED ORDER — NITROFURANTOIN MONOHYD MACRO 100 MG PO CAPS: 100.0000 mg | ORAL_CAPSULE | Freq: Two times a day (BID) | ORAL | 0 refills | Status: DC

## 2024-03-26 NOTE — ED Triage Notes (Signed)
 Patient complains of frequency, without complete emptying, and burning. Started yesterday.

## 2024-03-26 NOTE — Discharge Instructions (Signed)
 You have a urinary tract infection.  Treating  with antibiotics.  Drink plenty of water.  We are sending for culture and will call if changes need to be made.

## 2024-03-27 NOTE — ED Provider Notes (Signed)
 [facility]    CSN: [id number] Arrival date & time: 03/26/24  1723      History   Chief Complaint Chief Complaint  Patient presents with   Dysuria    HPI Rhonda [name] is a 63 y.o. female.   Patient is a 63 year old female who presents today with urinary frequency, urinary retention, and dysuria. Started yesterday.  Symptoms have been constant.  She denies any associated back pain, flank pain, fevers, chills, nausea or vomiting.   Dysuria   Past Medical History:  Diagnosis Date   Allergy    Asthma    Eczema    History of COVID-19 03/01/2020   [facility]-01/14/20    Hyperlipidemia    Hypertension    Migraine    NSAID induced gastritis 03/01/2020   Obesity    Prediabetes    Vitamin D  deficiency     Patient Active Problem List   Diagnosis Date Noted   Morbid obesity (HCC) 12/21/2021   Osteoarthritis of hands, bilateral 03/01/2020   GERD (gastroesophageal reflux disease) 12/16/2018   Medication management 11/11/2013   Hypertension    Hyperlipidemia    Vitamin D  deficiency    Abnormal glucose    Asthma    Obesity (BMI 30.0-34.9)     Past Surgical History:  Procedure Laterality Date   DILATION AND CURETTAGE, DIAGNOSTIC / THERAPEUTIC  2009   ENDOMETRIAL ABLATION     LYMPH NODE BIOPSY Left 1991   negative   NEVUS EXCISION  2012   dysplastic from back    OB History   No obstetric history on file.      Home Medications    Prior to Admission medications   Medication Sig Start Date End Date Taking? Authorizing Provider  nitrofurantoin , macrocrystal-monohydrate, (MACROBID ) 100 MG capsule Take 1 capsule (100 mg total) by mouth 2 (two) times daily. 03/26/24  Yes Jceon Alverio A, FNP  AIRSUPRA  90-80 MCG/ACT AERO Inhale 2 puffs into the lungs as needed (every six hours for cough, wheeze, shortness of breath.  Rinse, gargle, and spit after use). 06/27/23   Kozlow, Camellia PARAS, MD  albuterol  (VENTOLIN  HFA) [phone] Base) MCG/ACT inhaler Can  inhale two puffs every four to six hours as needed for cough or wheeze. 10/19/21   Kozlow, Camellia PARAS, MD  aspirin 81 MG tablet Take 81 mg by mouth daily.    [provider]  Azelastine  HCl 137 MCG/SPRAY SOLN Place 137 mcg into the nose 2 (two) times daily.    [provider]  bisoprolol -hydrochlorothiazide  (ZIAC ) 5-6.25 MG tablet TAKE ONE TABLET BY MOUTH EVERY DAY FOR BLOOD PRESSURE 08/28/23   Tonita Fallow, MD  CALCIUM-MAGNESIUM-VITAMIN D  PO Take by mouth daily.    [provider]  celecoxib  (CELEBREX ) 200 MG capsule TAKE ONE CAPSULE BY MOUTH EVERY DAY 07/13/23   Cranford, Tonya, NP  Fluticasone-Umeclidin-Vilant (TRELEGY ELLIPTA ) 200-62.5-25 MCG/ACT AEPB Inhale 1 puff once daily 12/25/23   Kozlow, Eric J, MD  levocetirizine (XYZAL ) 5 MG tablet 1 tablet 1-2 times a day as needed 12/27/20   Kozlow, Camellia PARAS, MD  montelukast  (SINGULAIR ) 10 MG tablet TAKE ONE TABLET BY MOUTH AT BEDTIME 12/24/23   Kozlow, Eric J, MD  Multiple Vitamin (MULTIVITAMIN PO) Take by mouth daily.    [provider]  olmesartan  (BENICAR ) 40 MG tablet TAKE ONE TABLET BY MOUTH AT BEDTIME FOR BLOOD PRESSURE 12/25/22   Wilkinson, Dana E, NP  pantoprazole  (PROTONIX ) 40 MG tablet Take one tablet one to two  times daily as directed. 06/27/23   Kozlow, Camellia PARAS, MD  pravastatin  (PRAVACHOL ) 40 MG tablet TAKE ONE TABLET BY MOUTH AT BEDTIME FOR CHOLESTEROL 03/27/23   Jude Lonell BRAVO, NP    Family History Family History  Problem Relation Age of Onset   Hypertension Mother    COPD Father    Cancer Father        colon   Heart disease Father    Hyperlipidemia Father    Hypertension Father    Stroke Father    Allergic rhinitis Neg Hx    Angioedema Neg Hx    Asthma Neg Hx    Atopy Neg Hx    Eczema Neg Hx    Immunodeficiency Neg Hx    Urticaria Neg Hx     Social History Social History   Tobacco Use   Smoking status: Never   Smokeless tobacco: Never  Substance Use Topics   Alcohol use: No   Drug  use: No     Allergies   Ace inhibitors, Azithromycin, Prednisone , and Vicodin [hydrocodone-acetaminophen]   Review of Systems Review of Systems  Genitourinary:  Positive for dysuria.     Physical Exam Triage Vital Signs ED Triage Vitals  Encounter Vitals Group     BP 03/26/24 1846 (!) 187/101     Girls Systolic BP Percentile --      Girls Diastolic BP Percentile --      Boys Systolic BP Percentile --      Boys Diastolic BP Percentile --      Pulse Rate 03/26/24 1846 63     Resp 03/26/24 1846 20     Temp 03/26/24 1846 97.8 F (36.6 C)     Temp Source 03/26/24 1846 Oral     SpO2 03/26/24 1846 98 %     Weight --      Height --      Head Circumference --      Peak Flow --      Pain Score 03/26/24 1848 5     Pain Loc --      Pain Education --      Exclude from Growth Chart --    No data found.  Updated Vital Signs BP (!) 187/101 (BP Location: Right Arm) Comment: left arm Blood pressure 175/105  Pulse 63   Temp 97.8 F (36.6 C) (Oral)   Resp 20   SpO2 98%   Visual Acuity Right Eye Distance:   Left Eye Distance:   Bilateral Distance:    Right Eye Near:   Left Eye Near:    Bilateral Near:     Physical Exam Vitals and nursing note reviewed.  Constitutional:      General: She is not in acute distress.    Appearance: Normal appearance. She is not ill-appearing, toxic-appearing or diaphoretic.  Pulmonary:     Effort: Pulmonary effort is normal.  Neurological:     Mental Status: She is alert.  Psychiatric:        Mood and Affect: Mood normal.      UC Treatments / Results  Labs (all labs ordered are listed, but only abnormal results are displayed) Labs Reviewed  POCT URINE DIPSTICK - Abnormal; Notable for the following components:      Result Value   Color, UA light yellow (*)    Clarity, UA cloudy (*)    Blood, UA large (*)    Leukocytes, UA Large (3+) (*)    All other components within normal limits  URINE CULTURE    EKG   Radiology No  results found.  Procedures Procedures (including critical care time)  Medications Ordered in UC Medications - No data to display  Initial Impression / Assessment and Plan / UC Course  I have reviewed the triage vital signs and the nursing notes.  Pertinent labs & imaging results that were available during my care of the patient were reviewed by me and considered in my medical decision making (see chart for details).     Dysuria-urine with large leuks, large blood, cloudy.  Sending for culture.  Will go ahead and treat today for urinary tract infection based on urinalysis results and symptoms.  Treating with Macrobid . Recommend push fluids, Azo for symptom relief. Will call if any changes need to be made based on culture results. Final Clinical Impressions(s) / UC Diagnoses   Final diagnoses:  Dysuria     Discharge Instructions      You have a urinary tract infection.  Treating  with antibiotics.  Drink plenty of water.  We are sending for culture and will call if changes need to be made.      ED Prescriptions     Medication Sig Dispense Auth. Provider   nitrofurantoin , macrocrystal-monohydrate, (MACROBID ) 100 MG capsule Take 1 capsule (100 mg total) by mouth 2 (two) times daily. 10 capsule Adah Wilbert LABOR, FNP      PDMP not reviewed this encounter.   Adah Wilbert LABOR, FNP 03/27/24 1346

## 2024-03-28 LAB — URINE CULTURE: Culture: >=100000 — AB

## 2024-04-01 DIAGNOSIS — R300 Dysuria: Secondary | ICD-10-CM | POA: Diagnosis not present

## 2024-04-01 DIAGNOSIS — N390 Urinary tract infection, site not specified: Secondary | ICD-10-CM | POA: Diagnosis not present

## 2024-04-01 DIAGNOSIS — R350 Frequency of micturition: Secondary | ICD-10-CM | POA: Diagnosis not present

## 2024-04-01 DIAGNOSIS — R3915 Urgency of urination: Secondary | ICD-10-CM | POA: Diagnosis not present

## 2024-04-04 ENCOUNTER — Ambulatory Visit (HOSPITAL_COMMUNITY): Payer: Self-pay

## 2024-04-04 DIAGNOSIS — N3000 Acute cystitis without hematuria: Secondary | ICD-10-CM | POA: Diagnosis not present

## 2024-04-04 DIAGNOSIS — R300 Dysuria: Secondary | ICD-10-CM | POA: Diagnosis not present

## 2024-04-04 DIAGNOSIS — R000 Tachycardia, unspecified: Secondary | ICD-10-CM | POA: Diagnosis not present

## 2024-04-09 ENCOUNTER — Encounter: Payer: BC Managed Care – PPO | Admitting: Internal Medicine

## 2024-04-10 ENCOUNTER — Encounter: Payer: BC Managed Care – PPO | Admitting: Internal Medicine

## 2024-04-14 DIAGNOSIS — M170 Bilateral primary osteoarthritis of knee: Secondary | ICD-10-CM | POA: Diagnosis not present

## 2024-05-23 ENCOUNTER — Other Ambulatory Visit (HOSPITAL_COMMUNITY): Payer: Self-pay

## 2024-05-30 ENCOUNTER — Other Ambulatory Visit (HOSPITAL_COMMUNITY): Payer: Self-pay

## 2024-06-02 ENCOUNTER — Other Ambulatory Visit: Payer: Self-pay | Admitting: Allergy and Immunology

## 2024-06-18 ENCOUNTER — Ambulatory Visit: Admitting: Allergy and Immunology

## 2024-06-18 ENCOUNTER — Encounter: Payer: Self-pay | Admitting: Allergy and Immunology

## 2024-06-18 VITALS — BP 140/90 | HR 68 | Resp 12 | Ht 63.5 in | Wt 207.6 lb

## 2024-06-18 DIAGNOSIS — J4540 Moderate persistent asthma, uncomplicated: Secondary | ICD-10-CM

## 2024-06-18 DIAGNOSIS — J301 Allergic rhinitis due to pollen: Secondary | ICD-10-CM | POA: Diagnosis not present

## 2024-06-18 DIAGNOSIS — K219 Gastro-esophageal reflux disease without esophagitis: Secondary | ICD-10-CM

## 2024-06-18 NOTE — Patient Instructions (Addendum)
  1.  Allergen avoidance measures - pollens  2.  Treat and prevent inflammation:   A. Montelukast  10 mg - 1 tablet 1 time per day  B. Trelegy 200 - 1 inhalation 3-7 times per week   C. Nasacort  - 1 spray each nostril 1-7 times per week  3. Treat and prevent LPR:   A. Pantoprazole  40 mg - 1 tablet 1-2 times per day  4. If needed:   A. Xyzal  5 mg - 1 tablet 1-2 times per day  B. AIRSUPRA  - 2 inhalations every 6 hours  5. Influenza = Tamiflu. Covid = Paxlovid  6. Return to clinic in 6 months or earlier if problem

## 2024-06-18 NOTE — Progress Notes (Signed)
 [place] [facility] [place] [facility] [place] - [place] [facility] [place]   Follow-up Note  Referring Provider: Tonita Fallow, MD Primary Provider: Fritz Greig VEAR DEVONNA Date of Office Visit: 06/18/2024  Subjective:   Rhonda [name] [name] (DOB: 12-31-1960) is a 63 y.o. female who [facility] in re-evaluation of the following:  HPI: Rhonda returns to this clinic in evaluation of asthma, allergic rhinitis, LPR.  I last saw her in this clinic 19 Dec 2023.  She continues to use Trelegy Monday through Friday and she believes that this is working quite well to control her asthma over the course of the past 6 months.  Rarely does she use the short acting bronchodilator averaging out to less than 1 time per month.  She can exert herself without any problem playing pickle ball.  She has not required a systemic steroid or antibiotic for any type of airway issue.  Her reflux has been under very good control with the use of a proton pump inhibitor utilize mostly just 1 time per day.  Allergies as of 06/18/2024       Reactions   Ace Inhibitors    cough   Azithromycin    rash   Prednisone     High dose   Vicodin [hydrocodone-acetaminophen]    Nausea/vomiting        Medication List    Airsupra  90-80 MCG/ACT Aero Generic drug: Albuterol -Budesonide Inhale 2 puffs into the lungs as needed (every six hours for cough, wheeze, shortness of breath.  Rinse, gargle, and spit after use).   aspirin 81 MG tablet Take 81 mg by mouth daily.   bisoprolol -hydrochlorothiazide  5-6.25 MG tablet Commonly known as: ZIAC  TAKE ONE TABLET BY MOUTH EVERY DAY FOR BLOOD PRESSURE   CALCIUM-MAGNESIUM-VITAMIN D  PO Take by mouth daily.   celecoxib  200 MG capsule Commonly known as: CELEBREX  TAKE ONE CAPSULE BY MOUTH EVERY DAY   levocetirizine 5 MG tablet Commonly known as: XYZAL  1 tablet 1-2 times a day as needed   montelukast  10 MG tablet Commonly known as: SINGULAIR  TAKE ONE  TABLET BY MOUTH AT BEDTIME   MULTIVITAMIN PO Take by mouth daily.   olmesartan  40 MG tablet Commonly known as: BENICAR  TAKE ONE TABLET BY MOUTH AT BEDTIME FOR BLOOD PRESSURE   pantoprazole  40 MG tablet Commonly known as: PROTONIX  TAKE ONE TABLET BY MOUTH 1 OR 2 TIMES DAILY AS DIRECTED   pravastatin  40 MG tablet Commonly known as: PRAVACHOL  TAKE ONE TABLET BY MOUTH AT BEDTIME FOR CHOLESTEROL   Trelegy Ellipta  200-62.5-25 MCG/ACT Aepb Generic drug: Fluticasone-Umeclidin-Vilant Inhale 1 puff once daily    Past Medical History:  Diagnosis Date   Allergy    Asthma    Eczema    History of COVID-19 03/01/2020   [facility]-01/14/20    Hyperlipidemia    Hypertension    Migraine    NSAID induced gastritis 03/01/2020   Obesity    Prediabetes    Vitamin D  deficiency     Past Surgical History:  Procedure Laterality Date   DILATION AND CURETTAGE, DIAGNOSTIC / THERAPEUTIC  2009   ENDOMETRIAL ABLATION     LYMPH NODE BIOPSY Left 1991   negative   NEVUS EXCISION  2012   dysplastic from back    Review of systems negative except as noted in HPI / PMHx or noted below:  Review of Systems  Constitutional: Negative.   HENT: Negative.    Eyes: Negative.   Respiratory: Negative.  Cardiovascular: Negative.   Gastrointestinal: Negative.   Genitourinary: Negative.   Musculoskeletal: Negative.   Skin: Negative.   Neurological: Negative.   Endo/Heme/Allergies: Negative.   Psychiatric/Behavioral: Negative.       Objective:   Vitals:   06/18/24 0839  BP: (!) 140/90  Pulse: 68  Resp: 12  SpO2: 95%   Height: 5' 3.5 (161.3 cm)  Weight: 207 lb 9.6 oz (94.2 kg)   Physical Exam Constitutional:      Appearance: She is not diaphoretic.  HENT:     Head: Normocephalic.     Right Ear: Tympanic membrane, ear canal and external ear normal.     Left Ear: Tympanic membrane, ear canal and external ear normal.     Nose: Nose normal. No mucosal edema or  rhinorrhea.     Mouth/Throat:     Pharynx: Uvula midline. No oropharyngeal exudate.  Eyes:     Conjunctiva/sclera: Conjunctivae normal.  Neck:     Thyroid : No thyromegaly.     Trachea: Trachea normal. No tracheal tenderness or tracheal deviation.  Cardiovascular:     Rate and Rhythm: Normal rate and regular rhythm.     Heart sounds: Normal heart sounds, S1 normal and S2 normal. No murmur heard. Pulmonary:     Effort: No respiratory distress.     Breath sounds: Normal breath sounds. No stridor. No wheezing or rales.  Lymphadenopathy:     Head:     Right side of head: No tonsillar adenopathy.     Left side of head: No tonsillar adenopathy.     Cervical: No cervical adenopathy.  Skin:    Findings: No erythema or rash.     Nails: There is no clubbing.  Neurological:     Mental Status: She is alert.     Diagnostics: Spirometry was performed and demonstrated an FEV1 of 2.48 at 107 % of predicted.  Assessment and Plan:   1. Asthma, moderate persistent, well-controlled   2. Seasonal allergic rhinitis due to pollen   3. LPRD (laryngopharyngeal reflux disease)     1.  Allergen avoidance measures - pollens  2.  Treat and prevent inflammation:   A. Montelukast  10 mg - 1 tablet 1 time per day  B. Trelegy 200 - 1 inhalation 3-7 times per week   C. Nasacort  - 1 spray each nostril 1-7 times per week  3. Treat and prevent LPR:   A. Pantoprazole  40 mg - 1 tablet 1-2 times per day  4. If needed:   A. Xyzal  5 mg - 1 tablet 1-2 times per day  B. AIRSUPRA  - 2 inhalations every 6 hours  5. Influenza = Tamiflu. Covid = Paxlovid  6. Return to clinic in 6 months or earlier if problem  [name] appears to be doing quite well on a collection of anti-inflammatory agents utilized for her airway as noted above and she will continue to use Trelegy 5 times per week.  And her reflux is under good control using her proton pump inhibitor just 1 time per day at this point.  Assuming she does well with  this plan I will see her back in this clinic in 6 months or earlier if there is a problem.  Camellia Denis, MD Allergy / Immunology [facility]

## 2024-06-19 ENCOUNTER — Encounter: Payer: Self-pay | Admitting: Allergy and Immunology

## 2024-07-21 DIAGNOSIS — M8588 Other specified disorders of bone density and structure, other site: Secondary | ICD-10-CM | POA: Diagnosis not present

## 2024-09-02 ENCOUNTER — Other Ambulatory Visit: Payer: Self-pay | Admitting: Allergy and Immunology

## 2024-09-05 ENCOUNTER — Ambulatory Visit (HOSPITAL_BASED_OUTPATIENT_CLINIC_OR_DEPARTMENT_OTHER)
Admission: EM | Admit: 2024-09-05 | Discharge: 2024-09-05 | Disposition: A | Discharge status: Home / Self Care | Attending: Nurse Practitioner | Admitting: Nurse Practitioner

## 2024-09-05 ENCOUNTER — Ambulatory Visit (INDEPENDENT_AMBULATORY_CARE_PROVIDER_SITE_OTHER): Admitting: Radiology

## 2024-09-05 ENCOUNTER — Encounter (HOSPITAL_BASED_OUTPATIENT_CLINIC_OR_DEPARTMENT_OTHER): Payer: Self-pay

## 2024-09-05 DIAGNOSIS — S52552A Other extraarticular fracture of lower end of left radius, initial encounter for closed fracture: Secondary | ICD-10-CM

## 2024-09-05 DIAGNOSIS — W009XXA Unspecified fall due to ice and snow, initial encounter: Secondary | ICD-10-CM | POA: Diagnosis not present

## 2024-09-05 DIAGNOSIS — S8002XA Contusion of left knee, initial encounter: Secondary | ICD-10-CM | POA: Diagnosis not present

## 2024-09-05 DIAGNOSIS — W19XXXA Unspecified fall, initial encounter: Secondary | ICD-10-CM | POA: Diagnosis not present

## 2024-09-05 DIAGNOSIS — S6992XA Unspecified injury of left wrist, hand and finger(s), initial encounter: Secondary | ICD-10-CM | POA: Diagnosis not present

## 2024-09-05 NOTE — Discharge Instructions (Addendum)
 You have a small, nondisplaced fracture of your left wrist from your fall. This type of break is stable and usually heals well without surgery, but it must be kept completely still to heal properly. A splint has been placed to protect your wrist and you should keep it on at all times. Use the sling for support when up and walking. Keep your arm elevated above heart level as much as possible to reduce swelling and throbbing. You may use over-the-counter pain medicine such as acetaminophen or ibuprofen if you are able to take these, and you can apply an ice pack over the splint for 15-20 minutes at a time several times a day to help with pain and swelling.  You also have a bruise to your left knee. This should improve gradually with rest. You can use ice, gentle compression, elevation, and over-the-counter pain medicine for soreness. Light activity is okay, but avoid kneeling, squatting, or high-impact activity until the pain improves.  Schedule follow up with an orthopedic specialist within the next few days for repeat evaluation and possible repeat X-rays to make sure the wrist stays in good position while it heals. Go to the emergency department right away if your fingers become numb, tingly, very cold, pale, or bluish, if you cannot move your fingers, if swelling or pain rapidly worsens despite elevation and medication, or if the splint feels too tight. Seek urgent care for the knee if you suddenly cannot bear weight, the knee gives out, or you develop severe swelling, redness, warmth, or fever.

## 2024-09-05 NOTE — ED Provider Notes (Signed)
 " [name]    CSN: [id number] Arrival date & time: 09/05/24  0831      History   Chief Complaint Chief Complaint  Patient presents with   Wrist Pain    HPI Rhonda [name] is a 64 y.o. female.   Discussed the use of AI scribe software for clinical note transcription with the patient, who gave verbal consent to proceed.   Rhonda [name] presents with right wrist pain following a fall on ice last night. She also reports a bruise on her knee from the fall. The patient is right-handed and reports that while she was able to move the wrist somewhat last night, the pain has worsened this morning. She describes swelling and bruising in the wrist area. She took Advil last night for pain but has not taken any medication this morning. She denies numbness or tingling in the affected extremity. The patient also mentions having a headache, but attributes this to nasal congestion she has been experiencing since August. She denies hitting her head, passing out, dizziness, neck pain, or back pain related to the fall. She notes that she has arthritis in her knee. No other injures reported from the fall.   The following sections of the patient's history were reviewed and updated as appropriate: allergies, current medications, past family history, past medical history, past social history, past surgical history, and problem list.       Past Medical History:  Diagnosis Date   Allergy    Asthma    Eczema    History of COVID-19 03/01/2020   [facility]-01/14/20    Hyperlipidemia    Hypertension    Migraine    NSAID induced gastritis 03/01/2020   Obesity    Prediabetes    Vitamin D  deficiency     Patient Active Problem List   Diagnosis Date Noted   Morbid obesity (HCC) 12/21/2021   Osteoarthritis of hands, bilateral 03/01/2020   GERD (gastroesophageal reflux disease) 12/16/2018   Medication management 11/11/2013   Hypertension    Hyperlipidemia    Vitamin D  deficiency     Abnormal glucose    Asthma    Obesity (BMI 30.0-34.9)     Past Surgical History:  Procedure Laterality Date   DILATION AND CURETTAGE, DIAGNOSTIC / THERAPEUTIC  2009   ENDOMETRIAL ABLATION     LYMPH NODE BIOPSY Left 1991   negative   NEVUS EXCISION  2012   dysplastic from back    OB History   No obstetric history on file.      Home Medications    Prior to Admission medications  Medication Sig Start Date End Date Taking? Authorizing Provider  AIRSUPRA  90-80 MCG/ACT AERO Inhale 2 puffs into the lungs as needed (every six hours for cough, wheeze, shortness of breath.  Rinse, gargle, and spit after use). 06/27/23   Kozlow, Camellia PARAS, MD  aspirin 81 MG tablet Take 81 mg by mouth daily.    [provider]  bisoprolol -hydrochlorothiazide  (ZIAC ) 5-6.25 MG tablet TAKE ONE TABLET BY MOUTH EVERY DAY FOR BLOOD PRESSURE 08/28/23   Tonita Fallow, MD  CALCIUM-MAGNESIUM-VITAMIN D  PO Take by mouth daily.    [provider]  celecoxib  (CELEBREX ) 200 MG capsule TAKE ONE CAPSULE BY MOUTH EVERY DAY 07/13/23   Cranford, Tonya, NP  Fluticasone-Umeclidin-Vilant (TRELEGY ELLIPTA ) 200-62.5-25 MCG/ACT AEPB INHALE 1 PUFF BY MOUTH EVERY DAY 09/02/24   Kozlow, Eric J, MD  levocetirizine (XYZAL ) 5 MG tablet 1 tablet 1-2 times a day as needed  12/27/20   Kozlow, Camellia PARAS, MD  montelukast  (SINGULAIR ) 10 MG tablet TAKE ONE TABLET BY MOUTH AT BEDTIME 06/02/24   Kozlow, Eric J, MD  Multiple Vitamin (MULTIVITAMIN PO) Take by mouth daily.    [provider]  olmesartan  (BENICAR ) 40 MG tablet TAKE ONE TABLET BY MOUTH AT BEDTIME FOR BLOOD PRESSURE 12/25/22   Wilkinson, Dana E, NP  pantoprazole  (PROTONIX ) 40 MG tablet TAKE ONE TABLET BY MOUTH 1 OR 2 TIMES DAILY AS DIRECTED 06/02/24   Kozlow, Camellia PARAS, MD  pravastatin  (PRAVACHOL ) 40 MG tablet TAKE ONE TABLET BY MOUTH AT BEDTIME FOR CHOLESTEROL Patient not taking: Reported on 06/18/2024 03/27/23   Jude Lonell BRAVO, NP    Family History Family History   Problem Relation Age of Onset   Hypertension Mother    COPD Father    Cancer Father        colon   Heart disease Father    Hyperlipidemia Father    Hypertension Father    Stroke Father    Allergic rhinitis Neg Hx    Angioedema Neg Hx    Asthma Neg Hx    Atopy Neg Hx    Eczema Neg Hx    Immunodeficiency Neg Hx    Urticaria Neg Hx     Social History Social History[1]   Allergies   Ace inhibitors, Azithromycin, Prednisone , and Vicodin [hydrocodone-acetaminophen]   Review of Systems Review of Systems  HENT:  Positive for congestion.   Musculoskeletal:  Positive for arthralgias. Negative for back pain and neck pain.  Skin:  Positive for color change.  Neurological:  Positive for headaches. Negative for dizziness and numbness.  All other systems reviewed and are negative.    Physical Exam Triage Vital Signs ED Triage Vitals  Encounter Vitals Group     BP 09/05/24 0846 (!) 153/94     Girls Systolic BP Percentile --      Girls Diastolic BP Percentile --      Boys Systolic BP Percentile --      Boys Diastolic BP Percentile --      Pulse Rate 09/05/24 0846 72     Resp 09/05/24 0846 20     Temp 09/05/24 0846 98.4 F (36.9 C)     Temp Source 09/05/24 0846 Oral     SpO2 09/05/24 0846 94 %     Weight --      Height --      Head Circumference --      Peak Flow --      Pain Score 09/05/24 0849 6     Pain Loc --      Pain Education --      Exclude from Growth Chart --    No data found.  Updated Vital Signs BP (!) 153/94 (BP Location: Right Arm)   Pulse 72   Temp 98.4 F (36.9 C) (Oral)   Resp 20   SpO2 94%   Visual Acuity Right Eye Distance:   Left Eye Distance:   Bilateral Distance:    Right Eye Near:   Left Eye Near:    Bilateral Near:     Physical Exam Vitals reviewed.  Constitutional:      General: She is awake. She is not in acute distress.    Appearance: Normal appearance. She is well-developed. She is not ill-appearing, toxic-appearing or  diaphoretic.  HENT:     Head: Normocephalic.     Right Ear: Hearing normal.     Left Ear: Hearing normal.  Nose: Nose normal.     Mouth/Throat:     Mouth: Mucous membranes are moist.  Eyes:     General: Vision grossly intact.     Conjunctiva/sclera: Conjunctivae normal.  Cardiovascular:     Rate and Rhythm: Normal rate and regular rhythm.     Heart sounds: Normal heart sounds.  Pulmonary:     Effort: Pulmonary effort is normal.     Breath sounds: Normal breath sounds and air entry.  Musculoskeletal:     Left wrist: Swelling and tenderness (Mild bruising noted to the medial aspect of the left wrist with some swelling noted to the left knee without deformity or effusion.) present. No deformity or effusion. Decreased range of motion (mildy due to pain).     Cervical back: Normal range of motion and neck supple.     Left knee: No swelling. Tenderness (mild with some bruising, no effusion or erythema. Full strength, sensation and ROM of knee and extremity.) present.  Skin:    General: Skin is warm and dry.     Findings: Abrasion (anterior aspect of left knee) present.  Neurological:     General: No focal deficit present.     Mental Status: She is alert and oriented to person, place, and time.     Cranial Nerves: No cranial nerve deficit.     Sensory: Sensation is intact. No sensory deficit.     Motor: Motor function is intact.     Gait: Gait is intact.  Psychiatric:        Speech: Speech normal.        Behavior: Behavior is cooperative.      UC Treatments / Results  Labs (all labs ordered are listed, but only abnormal results are displayed) Labs Reviewed - No data to display  EKG   Radiology DG Wrist Complete Left Result Date: 09/05/2024 CLINICAL DATA:  Fall onto ice EXAM: LEFT WRIST - COMPLETE 3 VIEW COMPARISON:  None Available. FINDINGS: Cortical discontinuity along the dorsal aspect of the distal radial metaphysis. No acute dislocation. Degenerative changes of the  wrist, most pronounced at the thumb carpometacarpal joint. Soft tissues are unremarkable. IMPRESSION: Cortical discontinuity along the dorsal aspect of the distal radial metaphysis, suspicious for nondisplaced fracture. Electronically Signed   By: Limin  Xu M.D.   On: 09/05/2024 09:27    Procedures Orthopedic Injury Treatment  Date/Time: 09/05/2024 11:05 AM  Performed by: Roseann Niels PARAS, RN Authorized by: Iola Lukes, FNP   Consent:    Consent obtained:  Verbal   Consent given by:  Patient   Risks discussed:  FractureInjury location: wrist Location details: left wrist Injury type: fracture Pre-procedure neurovascular assessment: neurovascularly intact Pre-procedure distal perfusion: normal Pre-procedure neurological function: normal Pre-procedure range of motion: normal  Anesthesia: Local anesthesia used: no  Patient sedated: NoManipulation performed: no Immobilization: splint Splint type: sugar tong Supplies used: Ortho-Glass, elastic bandage and cotton padding Post-procedure neurovascular assessment: post-procedure neurovascularly intact Post-procedure distal perfusion: normal Post-procedure neurological function: normal Post-procedure range of motion: normal Comments: Post-splint assessment performed by NP demonstrates intact neurovascular status with full finger mobility, warm digits, and normal capillary refill.     (including critical care time)  Medications Ordered in UC Medications - No data to display  Initial Impression / Assessment and Plan / UC Course  I have reviewed the triage vital signs and the nursing notes.  Pertinent labs & imaging results that were available during my care of the patient were reviewed by me and considered in  my medical decision making (see chart for details).     Patient presents with acute right wrist pain after a mechanical fall on ice with localized swelling and ecchymosis and imaging demonstrating cortical irregularity of  the dorsal distal radial metaphysis consistent with a nondisplaced distal radius fracture. The absence of displacement or angulation makes this injury appropriate for initial conservative management. The wrist was immobilized in an orthoglass sugar-tong splint and a sling was provided for support and comfort. The patient was advised to keep the extremity elevated and use over-the-counter anti-inflammatory and analgesic medication as needed for pain control. Close outpatient follow-up with orthopedics is required for repeat examination and imaging to ensure maintained alignment and healing. The patient was instructed to seek urgent or emergency care for increasing pain unrelieved by medication, new numbness or tingling, weakness, pale or bluish discoloration of the fingers, inability to move the fingers, or worsening swelling suggesting neurovascular compromise.  The left knee findings are consistent with a soft tissue contusion without evidence of fracture or significant functional limitation. Conservative management with rest, activity modification, ice, elevation, and oral analgesics is recommended. Follow up with primary care is advised if pain, swelling, or difficulty bearing weight persists beyond several days, and emergency evaluation is indicated for rapidly worsening swelling, inability to walk, joint instability, or signs of infection such as increasing redness, warmth, or fever.  Today's evaluation has revealed no signs of a dangerous process. Discussed diagnosis with patient and/or guardian. Patient and/or guardian aware of their diagnosis, possible red flag symptoms to watch out for and need for close follow up. Patient and/or guardian understands verbal and written discharge instructions. Patient and/or guardian comfortable with plan and disposition.  Patient and/or guardian has a clear mental status at this time, good insight into illness (after discussion and teaching) and has clear judgment to  make decisions regarding their care  Documentation was completed with the aid of voice recognition software. Transcription may contain typographical errors.  Final Clinical Impressions(s) / UC Diagnoses   Final diagnoses:  Other closed extra-articular fracture of distal end of left radius, initial encounter  Contusion of left knee, initial encounter  Fall from slipping on ice, initial encounter     Discharge Instructions      You have a small, nondisplaced fracture of your left wrist from your fall. This type of break is stable and usually heals well without surgery, but it must be kept completely still to heal properly. A splint has been placed to protect your wrist and you should keep it on at all times. Use the sling for support when up and walking. Keep your arm elevated above heart level as much as possible to reduce swelling and throbbing. You may use over-the-counter pain medicine such as acetaminophen or ibuprofen if you are able to take these, and you can apply an ice pack over the splint for 15-20 minutes at a time several times a day to help with pain and swelling.  You also have a bruise to your left knee. This should improve gradually with rest. You can use ice, gentle compression, elevation, and over-the-counter pain medicine for soreness. Light activity is okay, but avoid kneeling, squatting, or high-impact activity until the pain improves.  Schedule follow up with an orthopedic specialist within the next few days for repeat evaluation and possible repeat X-rays to make sure the wrist stays in good position while it heals. Go to the emergency department right away if your fingers become numb, tingly, very cold,  pale, or bluish, if you cannot move your fingers, if swelling or pain rapidly worsens despite elevation and medication, or if the splint feels too tight. Seek urgent care for the knee if you suddenly cannot bear weight, the knee gives out, or you develop severe swelling,  redness, warmth, or fever.      ED Prescriptions   None    PDMP not reviewed this encounter.      [1]  Social History Tobacco Use   Smoking status: Never   Smokeless tobacco: Never  Substance Use Topics   Alcohol use: No   Drug use: No     Iola Lukes, FNP 09/05/24 1214  "

## 2024-09-05 NOTE — ED Triage Notes (Signed)
 Left wrist pain after slipping on ice last night. +bruising, swelling, pain. Good radial pulse. Cap refill good to left hand. Limited ROM. Patient states took advil and iced wrist last night.

## 2024-12-17 ENCOUNTER — Ambulatory Visit: Admitting: Allergy and Immunology
